# Patient Record
Sex: Female | Born: 1950
Health system: Southern US, Community
[De-identification: ages and names within clinical notes are randomized; demographics above are authoritative.]

## PROBLEM LIST (undated history)

## (undated) DIAGNOSIS — R112 Nausea with vomiting, unspecified: Secondary | ICD-10-CM

## (undated) DIAGNOSIS — K219 Gastro-esophageal reflux disease without esophagitis: Secondary | ICD-10-CM

## (undated) DIAGNOSIS — Z9889 Other specified postprocedural states: Secondary | ICD-10-CM

## (undated) DIAGNOSIS — H269 Unspecified cataract: Secondary | ICD-10-CM

## (undated) DIAGNOSIS — Z8719 Personal history of other diseases of the digestive system: Secondary | ICD-10-CM

## (undated) DIAGNOSIS — K760 Fatty (change of) liver, not elsewhere classified: Secondary | ICD-10-CM

## (undated) DIAGNOSIS — R569 Unspecified convulsions: Secondary | ICD-10-CM

## (undated) DIAGNOSIS — C801 Malignant (primary) neoplasm, unspecified: Secondary | ICD-10-CM

## (undated) DIAGNOSIS — M1990 Unspecified osteoarthritis, unspecified site: Secondary | ICD-10-CM

## (undated) HISTORY — PX: SMALL INTESTINE SURGERY: SHX150

## (undated) HISTORY — PX: COLOSTOMY: SHX63

## (undated) HISTORY — PX: OTHER SURGICAL HISTORY: SHX169

## (undated) HISTORY — DX: Unspecified cataract: H26.9

## (undated) HISTORY — PX: BREAST SURGERY: SHX581

## (undated) HISTORY — DX: Unspecified osteoarthritis, unspecified site: M19.90

---
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
## 2013-03-30 DIAGNOSIS — K5790 Diverticulosis of intestine, part unspecified, without perforation or abscess without bleeding: Secondary | ICD-10-CM | POA: Insufficient documentation

## 2013-03-30 DIAGNOSIS — K648 Other hemorrhoids: Secondary | ICD-10-CM | POA: Insufficient documentation

## 2013-04-23 DIAGNOSIS — M7580 Other shoulder lesions, unspecified shoulder: Secondary | ICD-10-CM | POA: Insufficient documentation

## 2013-05-15 DIAGNOSIS — G40A09 Absence epileptic syndrome, not intractable, without status epilepticus: Secondary | ICD-10-CM | POA: Insufficient documentation

## 2013-05-15 DIAGNOSIS — Z8669 Personal history of other diseases of the nervous system and sense organs: Secondary | ICD-10-CM | POA: Insufficient documentation

## 2013-05-15 DIAGNOSIS — G40309 Generalized idiopathic epilepsy and epileptic syndromes, not intractable, without status epilepticus: Secondary | ICD-10-CM | POA: Insufficient documentation

## 2013-10-08 DIAGNOSIS — C50111 Malignant neoplasm of central portion of right female breast: Secondary | ICD-10-CM | POA: Insufficient documentation

## 2014-01-24 DIAGNOSIS — L989 Disorder of the skin and subcutaneous tissue, unspecified: Secondary | ICD-10-CM | POA: Insufficient documentation

## 2014-01-24 DIAGNOSIS — Z5181 Encounter for therapeutic drug level monitoring: Secondary | ICD-10-CM | POA: Insufficient documentation

## 2015-03-28 DIAGNOSIS — M810 Age-related osteoporosis without current pathological fracture: Secondary | ICD-10-CM | POA: Insufficient documentation

## 2016-04-04 DIAGNOSIS — H93293 Other abnormal auditory perceptions, bilateral: Secondary | ICD-10-CM | POA: Insufficient documentation

## 2017-12-12 DIAGNOSIS — Z8719 Personal history of other diseases of the digestive system: Secondary | ICD-10-CM

## 2017-12-12 DIAGNOSIS — K5732 Diverticulitis of large intestine without perforation or abscess without bleeding: Secondary | ICD-10-CM | POA: Insufficient documentation

## 2017-12-12 HISTORY — DX: Personal history of other diseases of the digestive system: Z87.19

## 2019-01-04 DIAGNOSIS — Z933 Colostomy status: Secondary | ICD-10-CM | POA: Insufficient documentation

## 2019-01-18 DIAGNOSIS — IMO0002 Reserved for concepts with insufficient information to code with codable children: Secondary | ICD-10-CM | POA: Insufficient documentation

## 2019-01-18 DIAGNOSIS — M810 Age-related osteoporosis without current pathological fracture: Secondary | ICD-10-CM | POA: Diagnosis not present

## 2019-01-18 DIAGNOSIS — Z933 Colostomy status: Secondary | ICD-10-CM | POA: Diagnosis not present

## 2019-01-18 DIAGNOSIS — K5732 Diverticulitis of large intestine without perforation or abscess without bleeding: Secondary | ICD-10-CM | POA: Diagnosis not present

## 2019-01-18 DIAGNOSIS — Z8669 Personal history of other diseases of the nervous system and sense organs: Secondary | ICD-10-CM | POA: Diagnosis not present

## 2019-02-12 DIAGNOSIS — Z91018 Allergy to other foods: Secondary | ICD-10-CM | POA: Diagnosis not present

## 2019-02-12 DIAGNOSIS — Z933 Colostomy status: Secondary | ICD-10-CM | POA: Diagnosis not present

## 2019-02-12 DIAGNOSIS — Z886 Allergy status to analgesic agent status: Secondary | ICD-10-CM | POA: Diagnosis not present

## 2019-02-12 DIAGNOSIS — N390 Urinary tract infection, site not specified: Secondary | ICD-10-CM | POA: Diagnosis not present

## 2019-02-12 DIAGNOSIS — Z9049 Acquired absence of other specified parts of digestive tract: Secondary | ICD-10-CM | POA: Diagnosis not present

## 2019-02-12 DIAGNOSIS — Z433 Encounter for attention to colostomy: Secondary | ICD-10-CM | POA: Diagnosis not present

## 2019-02-12 DIAGNOSIS — Z885 Allergy status to narcotic agent status: Secondary | ICD-10-CM | POA: Diagnosis not present

## 2019-02-12 DIAGNOSIS — Z1159 Encounter for screening for other viral diseases: Secondary | ICD-10-CM | POA: Diagnosis not present

## 2019-02-12 DIAGNOSIS — K435 Parastomal hernia without obstruction or  gangrene: Secondary | ICD-10-CM | POA: Diagnosis not present

## 2019-02-12 DIAGNOSIS — R569 Unspecified convulsions: Secondary | ICD-10-CM | POA: Diagnosis not present

## 2019-02-12 DIAGNOSIS — E785 Hyperlipidemia, unspecified: Secondary | ICD-10-CM | POA: Diagnosis not present

## 2019-02-12 DIAGNOSIS — Z853 Personal history of malignant neoplasm of breast: Secondary | ICD-10-CM | POA: Diagnosis not present

## 2019-02-12 DIAGNOSIS — Z7982 Long term (current) use of aspirin: Secondary | ICD-10-CM | POA: Diagnosis not present

## 2019-02-12 DIAGNOSIS — K432 Incisional hernia without obstruction or gangrene: Secondary | ICD-10-CM | POA: Diagnosis not present

## 2019-02-12 DIAGNOSIS — Z79899 Other long term (current) drug therapy: Secondary | ICD-10-CM | POA: Diagnosis not present

## 2019-02-12 DIAGNOSIS — G8918 Other acute postprocedural pain: Secondary | ICD-10-CM | POA: Diagnosis not present

## 2019-02-12 DIAGNOSIS — Z888 Allergy status to other drugs, medicaments and biological substances status: Secondary | ICD-10-CM | POA: Diagnosis not present

## 2019-02-14 HISTORY — PX: COLOSTOMY REVERSAL: SHX5782

## 2019-02-15 DIAGNOSIS — K432 Incisional hernia without obstruction or gangrene: Secondary | ICD-10-CM | POA: Diagnosis not present

## 2019-02-15 DIAGNOSIS — Z433 Encounter for attention to colostomy: Secondary | ICD-10-CM | POA: Diagnosis not present

## 2019-02-15 DIAGNOSIS — Z933 Colostomy status: Secondary | ICD-10-CM | POA: Diagnosis not present

## 2019-02-15 DIAGNOSIS — Z9049 Acquired absence of other specified parts of digestive tract: Secondary | ICD-10-CM | POA: Insufficient documentation

## 2019-02-15 DIAGNOSIS — G8918 Other acute postprocedural pain: Secondary | ICD-10-CM | POA: Diagnosis not present

## 2019-02-15 DIAGNOSIS — K435 Parastomal hernia without obstruction or  gangrene: Secondary | ICD-10-CM | POA: Diagnosis not present

## 2019-03-05 ENCOUNTER — Other Ambulatory Visit: Payer: Self-pay

## 2019-03-05 ENCOUNTER — Ambulatory Visit (INDEPENDENT_AMBULATORY_CARE_PROVIDER_SITE_OTHER): Payer: Medicare Other | Admitting: Family Medicine

## 2019-03-05 ENCOUNTER — Encounter: Payer: Self-pay | Admitting: Family Medicine

## 2019-03-05 VITALS — BP 114/73 | HR 77 | Ht 60.0 in | Wt 149.0 lb

## 2019-03-05 DIAGNOSIS — F411 Generalized anxiety disorder: Secondary | ICD-10-CM

## 2019-03-05 DIAGNOSIS — Z853 Personal history of malignant neoplasm of breast: Secondary | ICD-10-CM

## 2019-03-05 DIAGNOSIS — G40A09 Absence epileptic syndrome, not intractable, without status epilepticus: Secondary | ICD-10-CM | POA: Diagnosis not present

## 2019-03-05 DIAGNOSIS — Z9049 Acquired absence of other specified parts of digestive tract: Secondary | ICD-10-CM | POA: Diagnosis not present

## 2019-03-05 DIAGNOSIS — Z8719 Personal history of other diseases of the digestive system: Secondary | ICD-10-CM

## 2019-03-05 MED ORDER — SERTRALINE HCL 25 MG PO TABS: 25.0000 mg | ORAL_TABLET | Freq: Every day | ORAL | 0 refills | Status: DC

## 2019-03-05 NOTE — Assessment & Plan Note (Signed)
History of anxiety-discussed restarting a low dose of Zoloft.  I am happy to do so.  New prescription sent to the pharmacy.  I like to follow-up with her in about 2 to 3 months before refill she would like it sent to mail order just to make sure that she is doing well and she is getting the response that she hopes and to make sure that the dose does not need to be adjusted before we send over another 90-day supply.

## 2019-03-05 NOTE — Assessment & Plan Note (Signed)
Followed at [facility] neurology.  Currently on Keppra for control.

## 2019-03-05 NOTE — Progress Notes (Signed)
Established Patient Office Visit  Subjective:  Patient ID: Valerie Fisher, female    DOB: 05-Jun-1951  Age: 68 y.o. MRN: [id number]  CC:  Chief Complaint  Patient presents with  . Establish Care    HPI Valerie Fisher presents to establish care.  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  She ended up having a colostomy because she had an episode of diverticulitis with abscess that ended up with a perforation.  She actually just had a colostomy reversal February 15, 2019.  She also reports a history of generalized seizures.  She says they started in 6 grade and then seemed to resolve but then had a grand mal seizure in 2010.  She follows with Dr. Yvonne Kendall at [facility] and is currently on Keppra twice a day.  She was also previously on anxiety medication, Zoloft.  She did well with it and took it for about 2 years.  This was around the time that I believe her mother passed away.  She says more recently she is been under increased stress after having the reversal of her colectomy.  She is been trying really hard not to take pain medications and feels like she would like to get back on her Zoloft.  She is been primarily relying on Tylenol for pain control she is been taking at thousand milligrams in the morning and at thousand milligrams in the evening.  Her colonoscopy and mammograms are up-to-date.  In regards to Pap smears she has aged out but says had a history of all normal Pap smears in the past.  She did receive her pneumonia vaccinations after age 1 as well as her shingles vaccine she had the new Shingrix and she did complete both injections.  Past Medical History:  Diagnosis Date  . History of diverticular abscess of colon 12/12/2017    Past Surgical History:  Procedure Laterality Date  . COLOSTOMY    . COLOSTOMY REVERSAL  02/2019    Family History  Problem Relation Age of Onset  . Diabetes Father   . Heart attack Father    mid 23s    Social History   Socioeconomic History  . Marital status: Married    Spouse name: BJ  . Number of children: 3  . Years of education: Not on file  . Highest education level: Not on file  Occupational History  . Occupation: retired Tour manager  . Financial resource strain: Not on file  . Food insecurity    Worry: Not on file    Inability: Not on file  . Transportation needs    Medical: Not on file    Non-medical: Not on file  Tobacco Use  . Smoking status: Never Smoker  . Smokeless tobacco: Never Used  Substance and Sexual Activity  . Alcohol use: Yes    Alcohol/week: 0.0 standard drinks    Comment: .5 glass once a month  . Drug use: Never  . Sexual activity: Yes    Partners: Male    Birth control/protection: None  Lifestyle  . Physical activity    Days per week: Not on file    Minutes per session: Not on file  . Stress: Not on file  Relationships  . Social Herbalist on phone: Not on file    Gets together: Not on file    Attends religious service: Not on file    Active member of club or organization: Not  on file    Attends meetings of clubs or organizations: Not on file    Relationship status: Not on file  . Intimate partner violence    Fear of current or ex partner: Not on file    Emotionally abused: Not on file    Physically abused: Not on file    Forced sexual activity: Not on file  Other Topics Concern  . Not on file  Social History Narrative  . Not on file    Outpatient Medications Prior to Visit  Medication Sig Dispense Refill  . levETIRAcetam (KEPPRA) 500 MG tablet Take 500 mg by mouth 3 (three) times daily.     . Loratadine 10 MG CAPS Take 1 capsule by mouth as needed.    Marland Kitchen acetaminophen (TYLENOL) 325 MG tablet Take 650 mg by mouth every 6 (six) hours as needed for pain.    Marland Kitchen aspirin EC 81 MG tablet Take 1 tablet by mouth daily.    Marland Kitchen levETIRAcetam (KEPPRA) 500 MG tablet Take 3 tablets by mouth. Take 3 tablets in the  morning and 3 tablets in the evening.     No facility-administered medications prior to visit.     Allergies  Allergen Reactions  . Aspirin Other (See Comments)    Other reaction(s): GI Upset (intolerance), Other GI upset GI upset   . Hydrocodone-Acetaminophen Nausea And Vomiting  . Hydromorphone Hcl Nausea And Vomiting  . Morphine Nausea And Vomiting  . Pseudoephedrine Hcl Nausea And Vomiting    ROS Review of Systems    Objective:    Physical Exam  Constitutional: She is oriented to person, place, and time. She appears well-developed and well-nourished.  HENT:  Head: Normocephalic and atraumatic.  Cardiovascular: Normal rate, regular rhythm and normal heart sounds.  Pulmonary/Chest: Effort normal and breath sounds normal.  Neurological: She is alert and oriented to person, place, and time.  Skin: Skin is warm and dry.  Psychiatric: She has a normal mood and affect. Her behavior is normal.    BP 114/73   Pulse 77   Ht 5' (1.524 m)   Wt 149 lb (67.6 kg)   SpO2 99%   BMI 29.10 kg/m  Wt Readings from Last 3 Encounters:  03/05/19 149 lb (67.6 kg)     Health Maintenance Due  Topic Date Due  . Hepatitis C Screening  1951/07/31  . MAMMOGRAM  11/04/1998  . COLONOSCOPY  11/04/1998  . DEXA SCAN  11/03/2013    There are no preventive care reminders to display for this patient.  No results found for: TSH No results found for: WBC, HGB, HCT, MCV, PLT No results found for: NA, K, CHLORIDE, CO2, GLUCOSE, BUN, CREATININE, BILITOT, ALKPHOS, AST, ALT, PROT, ALBUMIN, CALCIUM, ANIONGAP, EGFR, GFR No results found for: CHOL No results found for: HDL No results found for: LDLCALC No results found for: TRIG No results found for: CHOLHDL No results found for: HGBA1C    Assessment & Plan:   Problem List Items Addressed This Visit      Digestive   History of diverticular abscess of colon     Nervous and Auditory   Absence epilepsy, not intractable, without status  epilepticus ([place]) - Primary    Followed at [facility] neurology.  Currently on Keppra for control.      Relevant Medications   levETIRAcetam (KEPPRA) 500 MG tablet     Other   History of right breast cancer    Was followed at [facility].  Completed all  treatments.      H/O colectomy    Status post reversal-overall doing well but pain is not well controlled.  We discussed alternating an anti-inflammatory with the Tylenol and maybe even considering tramadol if needed.  Just that she will at least try doing an NSAID.  Stop immediately if any GI upset or irritation.      GAD (generalized anxiety disorder)    History of anxiety-discussed restarting a low dose of Zoloft.  I am happy to do so.  New prescription sent to the pharmacy.  I like to follow-up with her in about 2 to 3 months before refill she would like it sent to mail order just to make sure that she is doing well and she is getting the response that she hopes and to make sure that the dose does not need to be adjusted before we send over another 90-day supply.      Relevant Medications   sertraline (ZOLOFT) 25 MG tablet      Meds ordered this encounter  Medications  . sertraline (ZOLOFT) 25 MG tablet    Sig: Take 1 tablet (25 mg total) by mouth daily.    Dispense:  90 tablet    Refill:  0    Follow-up: Return in about 2 months (around 05/06/2019) for virtual visit for mood, restart zoloft .    Beatrice Lecher, MD

## 2019-03-05 NOTE — Assessment & Plan Note (Signed)
Was followed at [facility].  Completed all treatments.

## 2019-03-05 NOTE — Assessment & Plan Note (Signed)
Status post reversal-overall doing well but pain is not well controlled.  We discussed alternating an anti-inflammatory with the Tylenol and maybe even considering tramadol if needed.  Just that she will at least try doing an NSAID.  Stop immediately if any GI upset or irritation.

## 2019-04-06 DIAGNOSIS — Z9049 Acquired absence of other specified parts of digestive tract: Secondary | ICD-10-CM | POA: Diagnosis not present

## 2019-04-06 DIAGNOSIS — K582 Mixed irritable bowel syndrome: Secondary | ICD-10-CM | POA: Diagnosis not present

## 2019-04-06 DIAGNOSIS — Z9889 Other specified postprocedural states: Secondary | ICD-10-CM | POA: Diagnosis not present

## 2019-04-25 ENCOUNTER — Other Ambulatory Visit: Payer: Self-pay | Admitting: *Deleted

## 2019-04-25 DIAGNOSIS — F411 Generalized anxiety disorder: Secondary | ICD-10-CM

## 2019-04-25 MED ORDER — SERTRALINE HCL 25 MG PO TABS: 25.0000 mg | ORAL_TABLET | Freq: Every day | ORAL | 1 refills | Status: DC

## 2019-05-02 ENCOUNTER — Ambulatory Visit (INDEPENDENT_AMBULATORY_CARE_PROVIDER_SITE_OTHER): Payer: Medicare Other | Admitting: Family Medicine

## 2019-05-02 ENCOUNTER — Other Ambulatory Visit: Payer: Self-pay

## 2019-05-02 DIAGNOSIS — Z23 Encounter for immunization: Secondary | ICD-10-CM

## 2019-05-07 ENCOUNTER — Encounter: Payer: Self-pay | Admitting: Family Medicine

## 2019-05-07 ENCOUNTER — Ambulatory Visit (INDEPENDENT_AMBULATORY_CARE_PROVIDER_SITE_OTHER): Payer: Medicare Other | Admitting: Family Medicine

## 2019-05-07 VITALS — Temp 97.1°F | Ht 60.0 in | Wt 137.0 lb

## 2019-05-07 DIAGNOSIS — F411 Generalized anxiety disorder: Secondary | ICD-10-CM | POA: Diagnosis not present

## 2019-05-07 DIAGNOSIS — G4700 Insomnia, unspecified: Secondary | ICD-10-CM

## 2019-05-07 DIAGNOSIS — Z1231 Encounter for screening mammogram for malignant neoplasm of breast: Secondary | ICD-10-CM | POA: Diagnosis not present

## 2019-05-07 DIAGNOSIS — M79604 Pain in right leg: Secondary | ICD-10-CM | POA: Insufficient documentation

## 2019-05-07 DIAGNOSIS — Z780 Asymptomatic menopausal state: Secondary | ICD-10-CM

## 2019-05-07 NOTE — Progress Notes (Signed)
Virtual Visit via Video Note  I connected with Valerie Fisher on 05/07/19 at 10:30 AM EDT by a video enabled telemedicine application and verified that I am speaking with the correct person using two identifiers.   I discussed the limitations of evaluation and management by telemedicine and the availability of in person appointments. The patient expressed understanding and agreed to proceed.    Established Patient Office Visit  Subjective:  Patient ID: Valerie Fisher, female    DOB: 20-Nov-1950  Age: 68 y.o. MRN: [id number]  CC:  Chief Complaint  Patient presents with  . mood    HPI Valerie Fisher presents for F/U GAD. 2 mo f/u.  We recently decided to restart Zoloft because of increased anxiety.  She is actually taken it in the past.  She is currently on 25 mg daily.  So far she has been tolerating it well and feels like it is been helpful.  She denies any significant side effects.  Would like to go ahead and get her mammogram and bone density scheduled at [facility]..  She still really struggling with sleep.  Is mostly having hard time falling asleep.  Feels like her mind races.  But she is also waking up about 3-4 times a night to urinate but usually is able to fall back asleep pretty easily.  She has tried some melatonin and so that is not really helping.  She also complains of her legs hurting mostly her right leg.  She has been trying to walk for 30 to 40 minutes each afternoon for exercise because it seems to help her abdominal pain.  But now her right leg has been hurting.  She says the other night she actually fell asleep on her back and feels like the leg felt better.  She feels like it is the whole leg she is not able to discriminate just a specific location.  She also is let me know she saw GI and was diagnosed with irritable bowel syndrome.  She was started on the low third map diet and has actually lost weight on that and feels like she is doing better.    Past Medical  History:  Diagnosis Date  . History of diverticular abscess of colon 12/12/2017    Past Surgical History:  Procedure Laterality Date  . COLOSTOMY    . COLOSTOMY REVERSAL  02/2019    Family History  Problem Relation Age of Onset  . Diabetes Father   . Heart attack Father        mid 37s    Social History   Socioeconomic History  . Marital status: Married    Spouse name: BJ  . Number of children: 3  . Years of education: Not on file  . Highest education level: Not on file  Occupational History  . Occupation: retired Tour manager  . Financial resource strain: Not on file  . Food insecurity    Worry: Not on file    Inability: Not on file  . Transportation needs    Medical: Not on file    Non-medical: Not on file  Tobacco Use  . Smoking status: Never Smoker  . Smokeless tobacco: Never Used  Substance and Sexual Activity  . Alcohol use: Yes    Alcohol/week: 0.0 standard drinks    Comment: .5 glass once a month  . Drug use: Never  . Sexual activity: Yes    Partners: Male    Birth control/protection: None  Lifestyle  . Physical activity  Days per week: Not on file    Minutes per session: Not on file  . Stress: Not on file  Relationships  . Social Herbalist on phone: Not on file    Gets together: Not on file    Attends religious service: Not on file    Active member of club or organization: Not on file    Attends meetings of clubs or organizations: Not on file    Relationship status: Not on file  . Intimate partner violence    Fear of current or ex partner: Not on file    Emotionally abused: Not on file    Physically abused: Not on file    Forced sexual activity: Not on file  Other Topics Concern  . Not on file  Social History Narrative  . Not on file    Outpatient Medications Prior to Visit  Medication Sig Dispense Refill  . levETIRAcetam (KEPPRA) 500 MG tablet Take 500 mg by mouth 3 (three) times daily.     . Loratadine 10 MG CAPS  Take 1 capsule by mouth as needed.    . sertraline (ZOLOFT) 25 MG tablet Take 1 tablet (25 mg total) by mouth daily. 90 tablet 1   No facility-administered medications prior to visit.     Allergies  Allergen Reactions  . Aspirin Other (See Comments)    Other reaction(s): GI Upset (intolerance), Other GI upset GI upset   . Hydrocodone-Acetaminophen Nausea And Vomiting  . Hydromorphone Hcl Nausea And Vomiting  . Morphine Nausea And Vomiting  . Pseudoephedrine Hcl Nausea And Vomiting    ROS Review of Systems    Objective:    Physical Exam  Constitutional: She is oriented to person, place, and time. She appears well-developed and well-nourished.  HENT:  Head: Normocephalic and atraumatic.  Eyes: Conjunctivae and EOM are normal.  Pulmonary/Chest: Effort normal.  Neurological: She is alert and oriented to person, place, and time.  Skin: Skin is dry. No pallor.  Psychiatric: She has a normal mood and affect. Her behavior is normal.  Vitals reviewed.   Temp (!) 97.1 F (36.2 C)   Ht 5' (1.524 m)   Wt 137 lb (62.1 kg)   BMI 26.76 kg/m  Wt Readings from Last 3 Encounters:  05/07/19 137 lb (62.1 kg)  03/05/19 149 lb (67.6 kg)     Health Maintenance Due  Topic Date Due  . Hepatitis C Screening  Nov 16, 1950  . MAMMOGRAM  11/04/1998  . COLONOSCOPY  11/04/1998  . DEXA SCAN  11/03/2013    There are no preventive care reminders to display for this patient.  No results found for: TSH No results found for: WBC, HGB, HCT, MCV, PLT No results found for: NA, K, CHLORIDE, CO2, GLUCOSE, BUN, CREATININE, BILITOT, ALKPHOS, AST, ALT, PROT, ALBUMIN, CALCIUM, ANIONGAP, EGFR, GFR No results found for: CHOL No results found for: HDL No results found for: LDLCALC No results found for: TRIG No results found for: CHOLHDL No results found for: HGBA1C    Assessment & Plan:   Problem List Items Addressed This Visit      Other   Right leg pain    Clear etiology.  It does seem to  have ramped up since she has been walking more I also do not want her to quit walking because is been really beneficial for her.  We discussed options including getting with 1 of our sports med docs.  She is not really able to localize her  pain.  And its not keeping her from walking which is good.      Insomnia    Multifactorial.  It sounds as she 7 a hard time falling asleep because her mind is racing which certainly could be anxiety triggered.  She is also waking up frequently at night with overactive bladder and then she is also having right leg pain which makes it more difficult for her to fall asleep.  We did discuss some nonpharmacologic treatments.  We also discussed putting a pillow between her knees to help with the leg pain and possible referral to sports medicine to work-up the leg pain a little bit more.  We will increase her Zoloft for 30 days and see if this helps.  If not we can prescribe consider other prescription options.      GAD (generalized anxiety disorder) - Primary    Anxiety-we discussed options.  She is actually doing well so far on the sertraline 25 mg but still really struggling with falling asleep.  Mostly her mind races when she first tries to go to sleep so we did discuss possibly increasing her Effexor release for 30 days to see if this makes a difference.  If not helpful then we can go from there.         Other Visit Diagnoses    Postmenopausal       Relevant Orders   DG Bone Density   Screening mammogram, encounter for       Relevant Orders   MM 3D SCREEN BREAST BILATERAL     Mammogram and DEXA ordered for [facility].  No orders of the defined types were placed in this encounter.   Follow-up: Return in about 4 weeks (around 06/04/2019) for medication changes and sleep problems. .    I discussed the assessment and treatment plan with the patient. The patient was provided an opportunity to ask questions and all were answered. The patient agreed with the plan  and demonstrated an understanding of the instructions.   The patient was advised to call back or seek an in-person evaluation if the symptoms worsen or if the condition fails to improve as anticipated.    Beatrice Lecher, MD

## 2019-05-07 NOTE — Assessment & Plan Note (Signed)
Clear etiology.  It does seem to have ramped up since she has been walking more I also do not want her to quit walking because is been really beneficial for her.  We discussed options including getting with 1 of our sports med docs.  She is not really able to localize her pain.  And its not keeping her from walking which is good.

## 2019-05-07 NOTE — Assessment & Plan Note (Signed)
Multifactorial.  It sounds as she 7 a hard time falling asleep because her mind is racing which certainly could be anxiety triggered.  She is also waking up frequently at night with overactive bladder and then she is also having right leg pain which makes it more difficult for her to fall asleep.  We did discuss some nonpharmacologic treatments.  We also discussed putting a pillow between her knees to help with the leg pain and possible referral to sports medicine to work-up the leg pain a little bit more.  We will increase her Zoloft for 30 days and see if this helps.  If not we can prescribe consider other prescription options.

## 2019-05-07 NOTE — Assessment & Plan Note (Signed)
Anxiety-we discussed options.  She is actually doing well so far on the sertraline 25 mg but still really struggling with falling asleep.  Mostly her mind races when she first tries to go to sleep so we did discuss possibly increasing her Effexor release for 30 days to see if this makes a difference.  If not helpful then we can go from there.

## 2019-05-07 NOTE — Progress Notes (Signed)
Pt reports that she is doing well on current regimen.Rashidah Belleville Lynetta, CMA  

## 2019-05-25 ENCOUNTER — Telehealth: Payer: Self-pay

## 2019-05-25 DIAGNOSIS — F411 Generalized anxiety disorder: Secondary | ICD-10-CM

## 2019-05-25 NOTE — Telephone Encounter (Signed)
Valerie Fisher called wanting a refill of Sertraline. She states the Sertraline was increased to 50 mg daily.

## 2019-05-27 MED ORDER — SERTRALINE HCL 50 MG PO TABS: 50.0000 mg | ORAL_TABLET | Freq: Every day | ORAL | 1 refills | Status: DC

## 2019-05-27 NOTE — Telephone Encounter (Signed)
rx sent, pls notify pt

## 2019-05-28 NOTE — Telephone Encounter (Signed)
Pt advised.

## 2019-06-06 ENCOUNTER — Ambulatory Visit: Payer: Medicare Other | Admitting: Family Medicine

## 2019-06-11 ENCOUNTER — Encounter: Payer: Self-pay | Admitting: Family Medicine

## 2019-06-11 ENCOUNTER — Ambulatory Visit (INDEPENDENT_AMBULATORY_CARE_PROVIDER_SITE_OTHER): Payer: Medicare Other | Admitting: Family Medicine

## 2019-06-11 VITALS — Ht 60.0 in | Wt 137.0 lb

## 2019-06-11 DIAGNOSIS — F411 Generalized anxiety disorder: Secondary | ICD-10-CM | POA: Diagnosis not present

## 2019-06-11 DIAGNOSIS — H6983 Other specified disorders of Eustachian tube, bilateral: Secondary | ICD-10-CM | POA: Diagnosis not present

## 2019-06-11 NOTE — Assessment & Plan Note (Signed)
She is doing well on her current regimen is happy with the results thus far.  We discussed options including staying on what she is doing or possibly going up.  She says for now she will just take with the 50 mg and will plan to follow-up in 3 months if at any point time she is having any problems please let us know.

## 2019-06-11 NOTE — Progress Notes (Signed)
Pt is doing well on current regimen.Ronie Barnhart Lynetta, CMA  

## 2019-06-11 NOTE — Progress Notes (Addendum)
Virtual Visit via Video Note  I connected with [name] on 06/11/19 at 10:50 AM EDT by a video enabled telemedicine application and verified that I am speaking with the correct person using two identifiers.   I discussed the limitations of evaluation and management by telemedicine and the availability of in person appointments. The patient expressed understanding and agreed to proceed.    Established Patient Office Visit  Subjective:  Patient ID: Valerie Fisher, female    DOB: 02/25/1951  Age: 68 y.o. MRN: [id number]  CC:  Chief Complaint  Patient presents with  . mood    HPI Valerie Fisher presents for follow-up generalized anxiety disorder-she was having some difficulty with sleep and mind racing when I last spoke with her 4 weeks ago.  We decided to increase her sertraline to 50 mg and this is a 1 month follow-up.  She feels like she has noticed some improvement and she has not had any side effects.  She said she still struggling some with her symptoms but overall is doing better.  She says her sleep has actually gotten much better.  Before she was just having a lot of mind racing before bedtime and really just could not get to sleep well.  She was under a lot of stress recently and had a reversal of her colostomy she had a perforated abscess.  She is actually doing really well and in fact she and her husband just got back from [facility].  She says they stayed safe and socially distance but it was good just to get away.  He also let me noted that she was starting to have pain and pressure in her ears again worse on the right ear compared to the left.  She has had this previously we have checked her ears and they have always looked good.  She wonders if it could have been from traveling to [facility].  She did start her nasal spray and loratadine when she got home.  He also wanted to know if she could consider getting Covid tested before Thanksgiving.  She wants to be around family  but wants to make sure that she and her husband do not have it.  Past Medical History:  Diagnosis Date  . History of diverticular abscess of colon 12/12/2017    Past Surgical History:  Procedure Laterality Date  . COLOSTOMY    . COLOSTOMY REVERSAL  02/2019    Family History  Problem Relation Age of Onset  . Diabetes Father   . Heart attack Father        mid 73s    Social History   Socioeconomic History  . Marital status: Married    Spouse name: BJ  . Number of children: 3  . Years of education: Not on file  . Highest education level: Not on file  Occupational History  . Occupation: retired Tour manager  . Financial resource strain: Not on file  . Food insecurity    Worry: Not on file    Inability: Not on file  . Transportation needs    Medical: Not on file    Non-medical: Not on file  Tobacco Use  . Smoking status: Never Smoker  . Smokeless tobacco: Never Used  Substance and Sexual Activity  . Alcohol use: Yes    Alcohol/week: 0.0 standard drinks    Comment: .5 glass once a month  . Drug use: Never  . Sexual activity: Yes    Partners: Male  Birth control/protection: None  Lifestyle  . Physical activity    Days per week: Not on file    Minutes per session: Not on file  . Stress: Not on file  Relationships  . Social Herbalist on phone: Not on file    Gets together: Not on file    Attends religious service: Not on file    Active member of club or organization: Not on file    Attends meetings of clubs or organizations: Not on file    Relationship status: Not on file  . Intimate partner violence    Fear of current or ex partner: Not on file    Emotionally abused: Not on file    Physically abused: Not on file    Forced sexual activity: Not on file  Other Topics Concern  . Not on file  Social History Narrative  . Not on file    Outpatient Medications Prior to Visit  Medication Sig Dispense Refill  . levETIRAcetam (KEPPRA) 500 MG  tablet Take 500 mg by mouth 3 (three) times daily.     . Loratadine 10 MG CAPS Take 1 capsule by mouth as needed.    . sertraline (ZOLOFT) 50 MG tablet Take 1 tablet (50 mg total) by mouth daily. 90 tablet 1   No facility-administered medications prior to visit.     Allergies  Allergen Reactions  . Aspirin Other (See Comments)    Other reaction(s): GI Upset (intolerance), Other GI upset GI upset   . Hydrocodone-Acetaminophen Nausea And Vomiting  . Hydromorphone Hcl Nausea And Vomiting  . Morphine Nausea And Vomiting  . Pseudoephedrine Hcl Nausea And Vomiting    ROS Review of Systems    Objective:    Physical Exam  Ht 5' (1.524 m)   Wt 137 lb (62.1 kg)   BMI 26.76 kg/m  Wt Readings from Last 3 Encounters:  06/11/19 137 lb (62.1 kg)  05/07/19 137 lb (62.1 kg)  03/05/19 149 lb (67.6 kg)     Health Maintenance Due  Topic Date Due  . Hepatitis C Screening  Mar 08, 1951  . MAMMOGRAM  11/04/1998  . COLONOSCOPY  11/04/1998  . DEXA SCAN  11/03/2013    There are no preventive care reminders to display for this patient.  No results found for: TSH No results found for: WBC, HGB, HCT, MCV, PLT No results found for: NA, K, CHLORIDE, CO2, GLUCOSE, BUN, CREATININE, BILITOT, ALKPHOS, AST, ALT, PROT, ALBUMIN, CALCIUM, ANIONGAP, EGFR, GFR No results found for: CHOL No results found for: HDL No results found for: LDLCALC No results found for: TRIG No results found for: CHOLHDL No results found for: HGBA1C    Assessment & Plan:   Problem List Items Addressed This Visit      Other   GAD (generalized anxiety disorder) - Primary    She is doing well on her current regimen is happy with the results thus far.  We discussed options including staying on what she is doing or possibly going up.  She says for now she will just take with the 50 mg and will plan to follow-up in 3 months if at any point time she is having any problems please let us know.       Other Visit Diagnoses     Dysfunction of both eustachian tubes         Eustachian tube dysfunction-certainly if her pain gets worse or she starts noticing any drainage in the ear or has a  fever please come into that we can do an exam but in the past ear exam has been normal.  Just restart nasal spray, loratadine and can add nasal saline irrigation as well.  Hopefully she will improve over the next 1 to 2 weeks.  We are happy to do a screening since she is asymptomatic before travel.  She would need to let [place] know about 3 to 4 days before she leaves for the beach for [place].    No orders of the defined types were placed in this encounter.   Follow-up: Return in about 3 months (around 09/11/2019) for Mood medication. .    I discussed the assessment and treatment plan with the patient. The patient was provided an opportunity to ask questions and all were answered. The patient agreed with the plan and demonstrated an understanding of the instructions.   The patient was advised to call back or seek an in-person evaluation if the symptoms worsen or if the condition fails to improve as anticipated.  Beatrice Lecher, MD

## 2019-08-21 LAB — HM MAMMOGRAPHY

## 2019-08-24 ENCOUNTER — Encounter: Payer: Self-pay | Admitting: Family Medicine

## 2019-08-24 LAB — HM MAMMOGRAPHY

## 2019-08-24 LAB — HM DEXA SCAN

## 2019-08-27 ENCOUNTER — Telehealth: Payer: Self-pay | Admitting: Family Medicine

## 2019-08-27 DIAGNOSIS — M810 Age-related osteoporosis without current pathological fracture: Secondary | ICD-10-CM

## 2019-08-27 DIAGNOSIS — Z13220 Encounter for screening for lipoid disorders: Secondary | ICD-10-CM

## 2019-08-27 NOTE — Telephone Encounter (Signed)
Call pt: DEXA scan shows score of -2.5 which is consistent with Osteoporosis. Based on this I would recommend a bone builder like Fosamax.  If she is ok with taking the medicaton to keep her bones strong let me know. Also  Recommend daily calcium and vitamin D supplement.

## 2019-08-28 NOTE — Telephone Encounter (Signed)
Valerie Fisher would like Prolia injection.    Barnet Pall can you see if she needs a PA for the Prolia?

## 2019-08-28 NOTE — Telephone Encounter (Signed)
Okay, sounds good lets get her set up for Prolia as long as insurance will cover.

## 2019-09-03 NOTE — Telephone Encounter (Signed)
I called the patient and she is agreeable to the Prolia and I advised her that I would check with insurance and they would get back with her once I received a response from insurance. No other questions at this time.

## 2019-09-07 ENCOUNTER — Encounter: Payer: Self-pay | Admitting: Family Medicine

## 2019-09-07 NOTE — Telephone Encounter (Signed)
Patient called back and wants to if she needs labs. I am in the process of working on her Prolia.  Please advise.

## 2019-09-07 NOTE — Telephone Encounter (Signed)
She will need calcium and renal function before Prolia. Plus I would like to get a lipid and CBC too.   CMP, lipid, CBC

## 2019-09-07 NOTE — Telephone Encounter (Signed)
lvm advising pt that she will need to have labs done prior to getting the injection. Labs faxed.Maryruth Eve, Lahoma Crocker

## 2019-09-11 ENCOUNTER — Ambulatory Visit (INDEPENDENT_AMBULATORY_CARE_PROVIDER_SITE_OTHER): Payer: Medicare Other | Admitting: Family Medicine

## 2019-09-11 ENCOUNTER — Telehealth: Payer: Self-pay | Admitting: Family Medicine

## 2019-09-11 ENCOUNTER — Encounter: Payer: Self-pay | Admitting: Family Medicine

## 2019-09-11 VITALS — BP 108/73 | HR 67 | Ht 60.0 in | Wt 135.0 lb

## 2019-09-11 DIAGNOSIS — G4700 Insomnia, unspecified: Secondary | ICD-10-CM | POA: Diagnosis not present

## 2019-09-11 DIAGNOSIS — Z13220 Encounter for screening for lipoid disorders: Secondary | ICD-10-CM | POA: Diagnosis not present

## 2019-09-11 DIAGNOSIS — M79604 Pain in right leg: Secondary | ICD-10-CM

## 2019-09-11 DIAGNOSIS — M79605 Pain in left leg: Secondary | ICD-10-CM

## 2019-09-11 DIAGNOSIS — M810 Age-related osteoporosis without current pathological fracture: Secondary | ICD-10-CM

## 2019-09-11 DIAGNOSIS — Z853 Personal history of malignant neoplasm of breast: Secondary | ICD-10-CM

## 2019-09-11 DIAGNOSIS — D649 Anemia, unspecified: Secondary | ICD-10-CM

## 2019-09-11 DIAGNOSIS — F411 Generalized anxiety disorder: Secondary | ICD-10-CM

## 2019-09-11 NOTE — Progress Notes (Signed)
Pt reports that she has noticed that she is beginning to experience hair loss, dizziness and continued achy legs with Sertraline.   Also she would like to know if there are any side effects with taking prolia and COVID

## 2019-09-11 NOTE — Telephone Encounter (Signed)
Call pt: the labs didn't draw the correct tubes of blood so they didn't run it. So we are just going to order that from Friday and thhe new stuff for [facility]. She just needs to make sure the [facility] doesn't bill her.

## 2019-09-11 NOTE — Progress Notes (Signed)
Virtual Visit via Video Note  I connected with Valerie Fisher on 09/11/19 at 10:30 AM EST by a video enabled telemedicine application and verified that I am speaking with the correct person using two identifiers.   I discussed the limitations of evaluation and management by telemedicine and the availability of in person appointments. The patient expressed understanding and agreed to proceed.   Established Patient Office Visit  Subjective:  Patient ID: Valerie Fisher, female    DOB: May 29, 1951  Age: 69 y.o. MRN: [id number]  CC:  Chief Complaint  Patient presents with  . mood    HPI Valerie Fisher presents for 60-monthfollow-up for mood medication. She feels like it is causing some intermittent dizziness. Can happen at any time and doesn't seem to be related to position change.  He says it just usually last for a few seconds and then seems to resolve.  She says she feels like it is making her hair fall out.  She does feel like it helps with her anxiety though.  She had taken this medication previously and did not have any problems with that at that time.  She still continues to have bilateral leg pain mostly in the calf area.  She says she really only notices it at night when she is getting ready to go to bed.  It does not really bother her during the day.  She has been trying to walk for 45 to 60 minutes/day but says she notices that the pain is there whether she walks or does not walk.  Since being retired she has been home and has mostly been wearing bedroom shoes around the house.  She denies any significant cramping.  She says she was diagnosed with restless leg syndrome years ago but has not had problems in a very long time.  She reports she still is not sleeping well.  Still having a hard time falling asleep.  But then also waking up about 3-4 times at night per previous note.  She had tried melatonin and did not find it really helpful.  She is also had reduced anxiety over the last several  months on the sertraline but that has not really helped her sleep much either.   She also reports that she did go for labs on Friday but says that she has not heard back about those results.  Past Medical History:  Diagnosis Date  . History of diverticular abscess of colon 12/12/2017    Past Surgical History:  Procedure Laterality Date  . COLOSTOMY    . COLOSTOMY REVERSAL  02/2019    Family History  Problem Relation Age of Onset  . Diabetes Father   . Heart attack Father        mid 768s   Social History   Socioeconomic History  . Marital status: Married    Spouse name: BJ  . Number of children: 3  . Years of education: Not on file  . Highest education level: Not on file  Occupational History  . Occupation: retired tPharmacist, hospital Tobacco Use  . Smoking status: Never Smoker  . Smokeless tobacco: Never Used  Substance and Sexual Activity  . Alcohol use: Yes    Alcohol/week: 0.0 standard drinks    Comment: .5 glass once a month  . Drug use: Never  . Sexual activity: Yes    Partners: Male    Birth control/protection: None  Other Topics Concern  . Not on file  Social History Narrative  . Not on  file   Social Determinants of Health   Financial Resource Strain:   . Difficulty of Paying Living Expenses: Not on file  Food Insecurity:   . Worried About Charity fundraiser in the Last Year: Not on file  . Ran Out of Food in the Last Year: Not on file  Transportation Needs:   . Lack of Transportation (Medical): Not on file  . Lack of Transportation (Non-Medical): Not on file  Physical Activity:   . Days of Exercise per Week: Not on file  . Minutes of Exercise per Session: Not on file  Stress:   . Feeling of Stress : Not on file  Social Connections:   . Frequency of Communication with Friends and Family: Not on file  . Frequency of Social Gatherings with Friends and Family: Not on file  . Attends Religious Services: Not on file  . Active Member of Clubs or  Organizations: Not on file  . Attends Archivist Meetings: Not on file  . Marital Status: Not on file  Intimate Partner Violence:   . Fear of Current or Ex-Partner: Not on file  . Emotionally Abused: Not on file  . Physically Abused: Not on file  . Sexually Abused: Not on file    Outpatient Medications Prior to Visit  Medication Sig Dispense Refill  . levETIRAcetam (KEPPRA) 500 MG tablet Take 500 mg by mouth 3 (three) times daily.     . Loratadine 10 MG CAPS Take 1 capsule by mouth as needed.    . sertraline (ZOLOFT) 50 MG tablet Take 1 tablet (50 mg total) by mouth daily. 90 tablet 1   No facility-administered medications prior to visit.    Allergies  Allergen Reactions  . Aspirin Other (See Comments)    Other reaction(s): GI Upset (intolerance), Other GI upset GI upset   . Hydrocodone-Acetaminophen Nausea And Vomiting  . Hydromorphone Hcl Nausea And Vomiting  . Morphine Nausea And Vomiting  . Pseudoephedrine Hcl Nausea And Vomiting    ROS Review of Systems    Objective:    Physical Exam  Constitutional: She is oriented to person, place, and time. She appears well-developed and well-nourished.  HENT:  Head: Normocephalic and atraumatic.  Eyes: Conjunctivae and EOM are normal.  Cardiovascular: Normal rate and normal heart sounds.  Pulmonary/Chest: Effort normal.  Neurological: She is alert and oriented to person, place, and time.  Skin: Skin is dry. No pallor.  Psychiatric: She has a normal mood and affect. Her behavior is normal.  Vitals reviewed.   BP 108/73   Pulse 67   Ht 5' (1.524 m)   Wt 135 lb (61.2 kg)   SpO2 98%   BMI 26.37 kg/m  Wt Readings from Last 3 Encounters:  09/11/19 135 lb (61.2 kg)  06/11/19 137 lb (62.1 kg)  05/07/19 137 lb (62.1 kg)     Health Maintenance Due  Topic Date Due  . Hepatitis C Screening  [birth year]  . COLONOSCOPY  11/04/1998  . DEXA SCAN  11/03/2013    There are no preventive care reminders to display  for this patient.  No results found for: TSH Lab Results  Component Value Date   WBC CANCELED 09/07/2019   Lab Results  Component Value Date   GLUCOSE CANCELED 09/07/2019   No results found for: CHOL No results found for: HDL Lab Results  Component Value Date   LDLCALC CANCELED 09/07/2019   No results found for: TRIG No results found for: CHOLHDL No results  found for: HGBA1C    Assessment & Plan:   Problem List Items Addressed This Visit      Musculoskeletal and Integument   Osteoporosis   Relevant Orders   Fe+TIBC+Fer   TSH   Lipid panel   CMP14+EGFR   CBC     Other   Insomnia   History of right breast cancer   Relevant Orders   Fe+TIBC+Fer   TSH   Lipid panel   CMP14+EGFR   CBC   GAD (generalized anxiety disorder) - Primary    Other Visit Diagnoses    Bilateral leg pain       Relevant Orders   Fe+TIBC+Fer   TSH   Lipid panel   CMP14+EGFR   CBC   Screening, lipid       Relevant Orders   Lipid panel   Hypocalcemia       Relevant Orders   TSH   CMP14+EGFR   Anemia, unspecified type       Relevant Orders   Fe+TIBC+Fer   CBC     Bilateral leg pain-sounds very consistent with possible restless leg syndrome.  Will evaluate for thyroid and iron deficiency.  She would like to get her labs done at [facility] instead of [facility].  So she will come by and pick up a prescription.  We also discussed wearing more supportive shoe wear during the day instead of bedroom slippers and seeing if that makes a difference at night.  Also consider trial of treatment/medication for restless leg if blood work is normal.  Insomnia -discussed that we could always consider a trial of a sleep medication at some point such as trazodone.  Generalized anxiety disorder-discussed options including changing medications.  She says for now she just wants to taper off and see if her symptoms resolve and then consider starting something new.  She will decrease the sertraline 50 mg down to  half a tab daily for 1 week and a half a tab every other day for 1 week and then stop.  Anemia-last hemoglobin was low.  So we will check some additional labs including iron and ferritin levels.  Plus with recent leg pain IM wanting to rule out iron deficiency anemia.  No orders of the defined types were placed in this encounter.  It looks like her labs were collected but they were not run.  Patient is preferring to go to [place].  Follow-up: Return in about 3 months (around 12/10/2019).    I discussed the assessment and treatment plan with the patient. The patient was provided an opportunity to ask questions and all were answered. The patient agreed with the plan and demonstrated an understanding of the instructions.   The patient was advised to call back or seek an in-person evaluation if the symptoms worsen or if the condition fails to improve as anticipated.   Beatrice Lecher, MD

## 2019-09-12 NOTE — Telephone Encounter (Signed)
I have had PCP sign the form and fax to insurance for benefits verification.

## 2019-09-13 LAB — CMP14+EGFR
ALT: 10 IU/L (ref 0–32)
AST: 16 IU/L (ref 0–40)
Albumin/Globulin Ratio: 1.8 (ref 1.2–2.2)
Albumin: 4.7 g/dL (ref 3.8–4.8)
Alkaline Phosphatase: 69 IU/L (ref 39–117)
BUN/Creatinine Ratio: 18 (ref 12–28)
BUN: 13 mg/dL (ref 8–27)
Bilirubin Total: 0.6 mg/dL (ref 0.0–1.2)
CO2: 25 mmol/L (ref 20–29)
Calcium: 10 mg/dL (ref 8.7–10.3)
Chloride: 101 mmol/L (ref 96–106)
Creatinine, Ser: 0.74 mg/dL (ref 0.57–1.00)
GFR calc Af Amer: 95 mL/min/{1.73_m2} (ref >59)
GFR calc non Af Amer: 82 mL/min/{1.73_m2} (ref >59)
Globulin, Total: 2.6 g/dL (ref 1.5–4.5)
Glucose: 74 mg/dL (ref 65–99)
Potassium: 5.1 mmol/L (ref 3.5–5.2)
Sodium: 142 mmol/L (ref 134–144)
Total Protein: 7.3 g/dL (ref 6.0–8.5)

## 2019-09-13 LAB — IRON,TIBC AND FERRITIN PANEL
Ferritin: 37 ng/mL (ref 15–150)
Iron Saturation: 32 % (ref 15–55)
Iron: 113 ug/dL (ref 27–139)
Total Iron Binding Capacity: 356 ug/dL (ref 250–450)
UIBC: 243 ug/dL (ref 118–369)

## 2019-09-13 LAB — LIPID PANEL W/REFLEX DIRECT LDL
Cholesterol: 235 mg/dL — ABNORMAL HIGH (ref <200)
HDL: 82 mg/dL (ref >=50)
LDL Cholesterol (Calc): 123 mg/dL (calc) — ABNORMAL HIGH
Non-HDL Cholesterol (Calc): 153 mg/dL (calc) — ABNORMAL HIGH (ref <130)
Total CHOL/HDL Ratio: 2.9 (calc) (ref <5.0)
Triglycerides: 180 mg/dL — ABNORMAL HIGH (ref <150)

## 2019-09-13 LAB — LIPID PANEL
Chol/HDL Ratio: 2.8 ratio (ref 0.0–4.4)
Cholesterol, Total: 246 mg/dL — ABNORMAL HIGH (ref 100–199)
HDL: 88 mg/dL (ref >39)
LDL Chol Calc (NIH): 134 mg/dL — ABNORMAL HIGH (ref 0–99)
Triglycerides: 138 mg/dL (ref 0–149)
VLDL Cholesterol Cal: 24 mg/dL (ref 5–40)

## 2019-09-13 LAB — CBC
Hematocrit: 48 % — ABNORMAL HIGH (ref 34.0–46.6)
Hemoglobin: 15.8 g/dL (ref 11.1–15.9)
MCH: 29.3 pg (ref 26.6–33.0)
MCHC: 32.9 g/dL (ref 31.5–35.7)
MCV: 89 fL (ref 79–97)
Platelets: 327 10*3/uL (ref 150–450)
RBC: 5.4 x10E6/uL — ABNORMAL HIGH (ref 3.77–5.28)
RDW: 12.7 % (ref 11.7–15.4)
WBC: 5.9 10*3/uL (ref 3.4–10.8)

## 2019-09-13 LAB — TSH: TSH: 1.65 u[IU]/mL (ref 0.450–4.500)

## 2019-09-13 LAB — COMPLETE METABOLIC PANEL WITH GFR

## 2019-09-13 NOTE — Telephone Encounter (Signed)
Will address with [facility]

## 2019-09-14 NOTE — Telephone Encounter (Signed)
Received benefits and patient has an appointment set up. No other questions.

## 2019-09-17 MED ORDER — ROPINIROLE HCL 0.25 MG PO TABS: 0.2500 mg | ORAL_TABLET | Freq: Every day | ORAL | 2 refills | Status: DC

## 2019-09-17 NOTE — Addendum Note (Signed)
Addended by: Beatrice Lecher D on: 09/17/2019 12:42 PM   Modules accepted: Orders

## 2019-09-20 ENCOUNTER — Ambulatory Visit (INDEPENDENT_AMBULATORY_CARE_PROVIDER_SITE_OTHER): Payer: Medicare Other | Admitting: Nurse Practitioner

## 2019-09-20 ENCOUNTER — Other Ambulatory Visit: Payer: Self-pay

## 2019-09-20 VITALS — Temp 98.4°F | Ht 60.0 in | Wt 140.0 lb

## 2019-09-20 DIAGNOSIS — M810 Age-related osteoporosis without current pathological fracture: Secondary | ICD-10-CM

## 2019-09-20 MED ORDER — DENOSUMAB 60 MG/ML ~~LOC~~ SOSY
60.0000 mg | PREFILLED_SYRINGE | Freq: Once | SUBCUTANEOUS | Status: AC
  Administered 2019-09-20: 60 mg via SUBCUTANEOUS

## 2019-09-20 NOTE — Progress Notes (Signed)
Patient in the office for her first prolia injection. She waited a couple of minutes to see if she had a reaction and no reaction while in the office. She tolerated the injection well subcutaneously in her right arm with no immediate reactions. She is going to call back with any questions. She will call about a month ahead of time to get the medication authorized and get labs before the next injection is due.  No other questions at this time.

## 2019-10-14 ENCOUNTER — Other Ambulatory Visit: Payer: Self-pay

## 2019-10-14 ENCOUNTER — Ambulatory Visit: Payer: Medicare Other | Attending: Internal Medicine

## 2019-10-14 DIAGNOSIS — Z23 Encounter for immunization: Secondary | ICD-10-CM

## 2019-10-14 NOTE — Progress Notes (Signed)
   Covid-19 Vaccination Clinic  Name:  Valerie Fisher    MRN: [id number] DOB: [birth year]  10/14/2019  Valerie Fisher was observed post Covid-19 immunization for 15 minutes without incidence. She was provided with Vaccine Information Sheet and instruction to access the V-Safe system.   Valerie Fisher was instructed to call 911 with any severe reactions post vaccine: Marland Kitchen Difficulty breathing  . Swelling of your face and throat  . A fast heartbeat  . A bad rash all over your body  . Dizziness and weakness    Immunizations Administered    Name Date Dose VIS Date Route   Pfizer COVID-19 Vaccine 10/14/2019  9:02 AM 0.3 mL 07/27/2019 Intramuscular   Manufacturer: [place]   Lot: [id number]   [place]: [id number]

## 2019-11-07 ENCOUNTER — Ambulatory Visit: Payer: Medicare Other | Attending: Internal Medicine

## 2019-11-07 DIAGNOSIS — Z23 Encounter for immunization: Secondary | ICD-10-CM

## 2019-11-07 NOTE — Progress Notes (Signed)
   Covid-19 Vaccination Clinic  Name:  Valerie Fisher    MRN: [id number] DOB: [birth year]  11/07/2019  [name] was observed post Covid-19 immunization for 15 minutes without incident. She was provided with Vaccine Information Sheet and instruction to access the V-Safe system.   [name] was instructed to call 911 with any severe reactions post vaccine: Marland Kitchen Difficulty breathing  . Swelling of face and throat  . A fast heartbeat  . A bad rash all over body  . Dizziness and weakness   Immunizations Administered    Name Date Dose VIS Date Route   Pfizer COVID-19 Vaccine 11/07/2019 11:16 AM 0.3 mL 07/27/2019 Intramuscular   Manufacturer: [place]   Lot: [id number]   [place]: [id number]

## 2019-11-08 ENCOUNTER — Other Ambulatory Visit: Payer: Self-pay

## 2019-11-08 ENCOUNTER — Ambulatory Visit (INDEPENDENT_AMBULATORY_CARE_PROVIDER_SITE_OTHER): Payer: Medicare Other | Admitting: Family Medicine

## 2019-11-08 ENCOUNTER — Encounter: Payer: Self-pay | Admitting: Family Medicine

## 2019-11-08 VITALS — BP 120/68 | HR 77 | Ht 60.0 in | Wt 140.0 lb

## 2019-11-08 DIAGNOSIS — F5101 Primary insomnia: Secondary | ICD-10-CM | POA: Diagnosis not present

## 2019-11-08 DIAGNOSIS — J0100 Acute maxillary sinusitis, unspecified: Secondary | ICD-10-CM | POA: Diagnosis not present

## 2019-11-08 DIAGNOSIS — H9203 Otalgia, bilateral: Secondary | ICD-10-CM | POA: Diagnosis not present

## 2019-11-08 MED ORDER — AMOXICILLIN-POT CLAVULANATE 875-125 MG PO TABS: 1.0000 | ORAL_TABLET | Freq: Two times a day (BID) | ORAL | 0 refills | Status: DC

## 2019-11-08 NOTE — Progress Notes (Signed)
Acute Office Visit  Subjective:    Patient ID: Valerie Fisher, female    DOB: Mar 15, 1951, 70 y.o.   MRN: [id number]  Chief Complaint  Patient presents with  . Sinusitis    x 6 days    HPI Patient is in today for ear pain and dizziness x 6 days.    She says she has bilateral ear pressure as well as nasal congestion and sinus pressure in both maxillary sinuses radiating into her upper teeth.  She says she is not blowing a lot of discolored mucus.  She has been taking her Zyrtec as well as her nasal spray.  She says the pressure gets that bad enough that she actually feels a little bit dizzy at times.  She also complains of difficulty falling asleep she feels like her mind is racing.  She has been taking her ropinirole for her restless leg.  She says that she just will lay there awake sometimes even for hours but then she will sleep in the next day.  She says her husband also goes to bed later than her and every time he gets into bed it also wakes her up.  She is also had a lot of shoulder discomfort when she first lays down that she thinks might be contributing as well.  Past Medical History:  Diagnosis Date  . History of diverticular abscess of colon 12/12/2017    Past Surgical History:  Procedure Laterality Date  . COLOSTOMY    . COLOSTOMY REVERSAL  02/2019    Family History  Problem Relation Age of Onset  . Diabetes Father   . Heart attack Father        mid 48s    Social History   Socioeconomic History  . Marital status: Married    Spouse name: BJ  . Number of children: 3  . Years of education: Not on file  . Highest education level: Not on file  Occupational History  . Occupation: retired Pharmacist, hospital  Tobacco Use  . Smoking status: Never Smoker  . Smokeless tobacco: Never Used  Substance and Sexual Activity  . Alcohol use: Yes    Alcohol/week: 0.0 standard drinks    Comment: .5 glass once a month  . Drug use: Never  . Sexual activity: Yes    Partners: Male     Birth control/protection: None  Other Topics Concern  . Not on file  Social History Narrative  . Not on file   Social Determinants of Health   Financial Resource Strain:   . Difficulty of Paying Living Expenses:   Food Insecurity:   . Worried About Charity fundraiser in the Last Year:   . Arboriculturist in the Last Year:   Transportation Needs:   . Film/video editor (Medical):   Marland Kitchen Lack of Transportation (Non-Medical):   Physical Activity:   . Days of Exercise per Week:   . Minutes of Exercise per Session:   Stress:   . Feeling of Stress :   Social Connections:   . Frequency of Communication with Friends and Family:   . Frequency of Social Gatherings with Friends and Family:   . Attends Religious Services:   . Active Member of Clubs or Organizations:   . Attends Archivist Meetings:   Marland Kitchen Marital Status:   Intimate Partner Violence:   . Fear of Current or Ex-Partner:   . Emotionally Abused:   Marland Kitchen Physically Abused:   . Sexually Abused:  Outpatient Medications Prior to Visit  Medication Sig Dispense Refill  . levETIRAcetam (KEPPRA) 500 MG tablet Take 500 mg by mouth 3 (three) times daily.     . Loratadine 10 MG CAPS Take 1 capsule by mouth as needed.    Marland Kitchen rOPINIRole (REQUIP) 0.25 MG tablet Take 1-3 tablets (0.25-0.75 mg total) by mouth at bedtime. 60 tablet 2  . sertraline (ZOLOFT) 50 MG tablet Take 1 tablet (50 mg total) by mouth daily. 90 tablet 1   No facility-administered medications prior to visit.    Allergies  Allergen Reactions  . Aspirin Other (See Comments)    Other reaction(s): GI Upset (intolerance), Other GI upset GI upset   . Hydrocodone-Acetaminophen Nausea And Vomiting  . Hydromorphone Hcl Nausea And Vomiting  . Morphine Nausea And Vomiting  . Pseudoephedrine Hcl Nausea And Vomiting    Review of Systems     Objective:    Physical Exam Constitutional:      Appearance: She is well-developed.  HENT:     Head: Normocephalic  and atraumatic.     Right Ear: External ear normal.     Left Ear: External ear normal.     Nose: Nose normal.  Eyes:     Conjunctiva/sclera: Conjunctivae normal.     Pupils: Pupils are equal, round, and reactive to light.  Neck:     Thyroid: No thyromegaly.  Cardiovascular:     Rate and Rhythm: Normal rate and regular rhythm.     Heart sounds: Normal heart sounds.  Pulmonary:     Effort: Pulmonary effort is normal.     Breath sounds: Normal breath sounds. No wheezing.  Musculoskeletal:     Cervical back: Neck supple.  Lymphadenopathy:     Cervical: No cervical adenopathy.  Skin:    General: Skin is warm and dry.  Neurological:     Mental Status: She is alert and oriented to person, place, and time.     BP 120/68   Pulse 77   Ht 5' (1.524 m)   Wt 140 lb (63.5 kg)   SpO2 100%   BMI 27.34 kg/m  Wt Readings from Last 3 Encounters:  11/08/19 140 lb (63.5 kg)  09/20/19 140 lb (63.5 kg)  09/11/19 135 lb (61.2 kg)    Health Maintenance Due  Topic Date Due  . Hepatitis C Screening  Never done  . COLONOSCOPY  Never done  . DEXA SCAN  Never done    There are no preventive care reminders to display for this patient.   Lab Results  Component Value Date   TSH 1.650 09/12/2019   Lab Results  Component Value Date   WBC 5.9 09/12/2019   HGB 15.8 09/12/2019   HCT 48.0 (H) 09/12/2019   MCV 89 09/12/2019   PLT 327 09/12/2019   Lab Results  Component Value Date   NA 142 09/12/2019   K 5.1 09/12/2019   CO2 25 09/12/2019   GLUCOSE 74 09/12/2019   BUN 13 09/12/2019   CREATININE 0.74 09/12/2019   BILITOT 0.6 09/12/2019   ALKPHOS 69 09/12/2019   AST 16 09/12/2019   ALT 10 09/12/2019   PROT 7.3 09/12/2019   ALBUMIN 4.7 09/12/2019   CALCIUM 10.0 09/12/2019   Lab Results  Component Value Date   CHOL 246 (H) 09/12/2019   Lab Results  Component Value Date   HDL 88 09/12/2019   Lab Results  Component Value Date   LDLCALC 134 (H) 09/12/2019   Lab Results  Component Value Date   TRIG 138 09/12/2019   Lab Results  Component Value Date   CHOLHDL 2.8 09/12/2019   No results found for: HGBA1C     Assessment & Plan:   Problem List Items Addressed This Visit      Other   Insomnia    Other Visit Diagnoses    Acute non-recurrent maxillary sinusitis    -  Primary   Relevant Medications   amoxicillin-clavulanate (AUGMENTIN) 875-125 MG tablet   Otalgia of both ears         Sinusitis  - will tx with augment. Continue allergy medication.  If not better in 1 week.  Insomnia-discussed sleep hygiene including avoiding caffeine setting a set bedtime and wake time.  Avoiding screen time an hour before going to bed.  Any been having a discussion with her husband about coming to bed later which often wakes her up.  We also discussed relaxation techniques at bedtime to try to get her mind to stop racing.  She can also consider a trial over-the-counter of melatonin or even Benadryl.  She could even consider Tylenol for her shoulder pain to see if that helps her get a little bit more relaxed.  She says she might even consider getting a new mattress as well.  Meds ordered this encounter  Medications  . amoxicillin-clavulanate (AUGMENTIN) 875-125 MG tablet    Sig: Take 1 tablet by mouth 2 (two) times daily.    Dispense:  14 tablet    Refill:  0     Beatrice Lecher, MD

## 2019-11-08 NOTE — Patient Instructions (Addendum)

## 2019-11-13 ENCOUNTER — Ambulatory Visit: Payer: Medicare Other

## 2019-11-19 ENCOUNTER — Other Ambulatory Visit: Payer: Self-pay | Admitting: Family Medicine

## 2019-11-19 DIAGNOSIS — F411 Generalized anxiety disorder: Secondary | ICD-10-CM

## 2020-01-17 ENCOUNTER — Telehealth: Payer: Self-pay

## 2020-01-17 NOTE — Telephone Encounter (Signed)
Patient called said she had discussed stopping Zoloft with Dr Madilyn Fireman at appt on 11/08/19. She is wanting to restart. Wanting to know if she should restart on the 50 or 25 mg. (RX for 50 mg sent to pharmacy on 11/19/19 for #90 with 1 RF)

## 2020-01-18 NOTE — Telephone Encounter (Signed)
Called patient and left VM with directions on restarting medication. Left call back information

## 2020-01-18 NOTE — Telephone Encounter (Signed)
Restart 1/2 tab QD x 10 days then increase to whole tab daily

## 2020-01-18 NOTE — Telephone Encounter (Signed)
Patient called back, confirmed she received msg and will begin to taper up

## 2020-02-11 ENCOUNTER — Other Ambulatory Visit: Payer: Self-pay | Admitting: Family Medicine

## 2020-02-12 ENCOUNTER — Telehealth: Payer: Self-pay

## 2020-02-12 ENCOUNTER — Other Ambulatory Visit: Payer: Self-pay | Admitting: Family Medicine

## 2020-02-12 NOTE — Telephone Encounter (Signed)
Routing to covering provider. Pt called requesting med refills for Keppra. Rx written by historical provider. Per pt, taking 3 tabs (bid). Pls send the rx to [facility].

## 2020-02-13 MED ORDER — LEVETIRACETAM 500 MG PO TABS: 1500.0000 mg | ORAL_TABLET | Freq: Two times a day (BID) | ORAL | 0 refills | Status: DC

## 2020-02-13 NOTE — Telephone Encounter (Signed)
Refill sent x30 days Further requests to be filled by PCP, 90 days ok if she decides it is

## 2020-02-14 NOTE — Telephone Encounter (Signed)
Patient advised. Has upcoming appointment with Dr Madilyn Fireman and will discuss then

## 2020-03-12 ENCOUNTER — Telehealth: Payer: Self-pay

## 2020-03-12 DIAGNOSIS — M810 Age-related osteoporosis without current pathological fracture: Secondary | ICD-10-CM

## 2020-03-12 NOTE — Telephone Encounter (Signed)
Patient is due for a Prolia injection on 03/19/2020 or after. A PA needs to be done along with CMP prior to scheduling. Also make sure we have in stock.   Barnet Pall can you call for the PA?

## 2020-03-12 NOTE — Telephone Encounter (Signed)
I have faxed all the information to insurance and waiting on a response.

## 2020-03-13 NOTE — Telephone Encounter (Signed)
Patient is going to [facility] to get labs done and labs are faxed. She will go to get labs this week or first of next week.

## 2020-03-14 DIAGNOSIS — M810 Age-related osteoporosis without current pathological fracture: Secondary | ICD-10-CM | POA: Diagnosis not present

## 2020-03-14 NOTE — Telephone Encounter (Signed)
Received a fax from [place] that [facility] does not require a PA. Patient has a injection with her name on this in the refrigerator. No other concerns. Form will be sent to scan.

## 2020-03-15 LAB — COMPREHENSIVE METABOLIC PANEL
ALT: 15 IU/L (ref 0–32)
AST: 21 IU/L (ref 0–40)
Albumin/Globulin Ratio: 1.9 (ref 1.2–2.2)
Albumin: 4.6 g/dL (ref 3.7–4.7)
Alkaline Phosphatase: 56 IU/L (ref 48–121)
BUN/Creatinine Ratio: 16 (ref 12–28)
BUN: 14 mg/dL (ref 8–27)
Bilirubin Total: 0.7 mg/dL (ref 0.0–1.2)
CO2: 25 mmol/L (ref 20–29)
Calcium: 10 mg/dL (ref 8.7–10.3)
Chloride: 100 mmol/L (ref 96–106)
Creatinine, Ser: 0.86 mg/dL (ref 0.57–1.00)
GFR calc Af Amer: 79 mL/min/{1.73_m2} (ref >59)
GFR calc non Af Amer: 68 mL/min/{1.73_m2} (ref >59)
Globulin, Total: 2.4 g/dL (ref 1.5–4.5)
Glucose: 104 mg/dL — ABNORMAL HIGH (ref 65–99)
Potassium: 4.6 mmol/L (ref 3.5–5.2)
Sodium: 140 mmol/L (ref 134–144)
Total Protein: 7 g/dL (ref 6.0–8.5)

## 2020-03-17 NOTE — Telephone Encounter (Signed)
All labs are normal. 

## 2020-03-19 ENCOUNTER — Ambulatory Visit: Payer: Medicare Other

## 2020-03-19 ENCOUNTER — Ambulatory Visit (INDEPENDENT_AMBULATORY_CARE_PROVIDER_SITE_OTHER): Payer: Medicare Other | Admitting: Family Medicine

## 2020-03-19 ENCOUNTER — Other Ambulatory Visit: Payer: Self-pay

## 2020-03-19 VITALS — BP 107/74 | HR 72 | Temp 98.4°F | Wt 142.0 lb

## 2020-03-19 DIAGNOSIS — M810 Age-related osteoporosis without current pathological fracture: Secondary | ICD-10-CM

## 2020-03-19 MED ORDER — DENOSUMAB 60 MG/ML ~~LOC~~ SOSY: 60.0000 mg | PREFILLED_SYRINGE | Freq: Once | SUBCUTANEOUS | 0 refills | Status: DC

## 2020-03-19 MED ORDER — DENOSUMAB 60 MG/ML ~~LOC~~ SOSY
60.0000 mg | PREFILLED_SYRINGE | Freq: Once | SUBCUTANEOUS | Status: AC
Start: 2020-03-19 — End: 2020-03-19
  Administered 2020-03-19: 60 mg via SUBCUTANEOUS

## 2020-03-19 NOTE — Progress Notes (Signed)
Agree with documentation as above.   Lundon Rosier, MD  

## 2020-03-19 NOTE — Progress Notes (Signed)
Pt is here for a Prolia injection. Patient reports taking calcium and vitamin D daily. Pt's calcium levels and kidney function was within normal limits. Pt tolerated injection on Left arm without any complications. Pt advised to schedule next injection in 6 months.

## 2020-04-28 DIAGNOSIS — Z8669 Personal history of other diseases of the nervous system and sense organs: Secondary | ICD-10-CM | POA: Diagnosis not present

## 2020-05-13 DIAGNOSIS — Z79899 Other long term (current) drug therapy: Secondary | ICD-10-CM | POA: Diagnosis not present

## 2020-05-13 DIAGNOSIS — R4781 Slurred speech: Secondary | ICD-10-CM | POA: Diagnosis not present

## 2020-05-13 DIAGNOSIS — Z853 Personal history of malignant neoplasm of breast: Secondary | ICD-10-CM | POA: Diagnosis not present

## 2020-05-13 DIAGNOSIS — R410 Disorientation, unspecified: Secondary | ICD-10-CM | POA: Diagnosis not present

## 2020-05-30 ENCOUNTER — Other Ambulatory Visit: Payer: Self-pay

## 2020-05-30 ENCOUNTER — Ambulatory Visit (INDEPENDENT_AMBULATORY_CARE_PROVIDER_SITE_OTHER): Payer: Medicare Other | Admitting: Family Medicine

## 2020-05-30 VITALS — BP 102/69 | HR 75 | Temp 98.1°F | Wt 142.6 lb

## 2020-05-30 DIAGNOSIS — Z23 Encounter for immunization: Secondary | ICD-10-CM | POA: Diagnosis not present

## 2020-05-30 NOTE — Progress Notes (Signed)
Patient presented for flu vaccine.   LD High dose 0.75mL  Patient tolerated well.

## 2020-06-03 DIAGNOSIS — R9082 White matter disease, unspecified: Secondary | ICD-10-CM | POA: Diagnosis not present

## 2020-06-03 DIAGNOSIS — G319 Degenerative disease of nervous system, unspecified: Secondary | ICD-10-CM | POA: Diagnosis not present

## 2020-06-11 DIAGNOSIS — Z853 Personal history of malignant neoplasm of breast: Secondary | ICD-10-CM | POA: Diagnosis not present

## 2020-07-02 ENCOUNTER — Encounter: Payer: Self-pay | Admitting: Family Medicine

## 2020-07-17 ENCOUNTER — Other Ambulatory Visit: Payer: Self-pay | Admitting: Family Medicine

## 2020-07-17 DIAGNOSIS — F411 Generalized anxiety disorder: Secondary | ICD-10-CM

## 2020-09-19 ENCOUNTER — Other Ambulatory Visit: Payer: Self-pay

## 2020-09-19 ENCOUNTER — Telehealth: Payer: Self-pay | Admitting: Family Medicine

## 2020-09-19 ENCOUNTER — Ambulatory Visit: Payer: Medicare Other

## 2020-09-19 DIAGNOSIS — M810 Age-related osteoporosis without current pathological fracture: Secondary | ICD-10-CM

## 2020-09-19 NOTE — Progress Notes (Signed)
Ordered CMP through [employer].

## 2020-09-19 NOTE — Telephone Encounter (Signed)
Pt called to make an appt for abdominal pain. I spoke with Levada Dy and she told me to let Beau know she needs to go to the urgent care since we do not have anything open today with any providers. She asked if we can just wait until Monday but I informed her that the nurses advice is to be seen by urgent care today. Hennessy understood and said she will go be seen.

## 2020-09-19 NOTE — Telephone Encounter (Signed)
OK 

## 2020-09-22 ENCOUNTER — Other Ambulatory Visit: Payer: Self-pay

## 2020-09-22 ENCOUNTER — Ambulatory Visit (INDEPENDENT_AMBULATORY_CARE_PROVIDER_SITE_OTHER): Payer: Medicare Other | Admitting: Family Medicine

## 2020-09-22 ENCOUNTER — Encounter: Payer: Self-pay | Admitting: Family Medicine

## 2020-09-22 VITALS — BP 132/89 | HR 89 | Ht 60.0 in | Wt 141.0 lb

## 2020-09-22 DIAGNOSIS — Z8719 Personal history of other diseases of the digestive system: Secondary | ICD-10-CM | POA: Diagnosis not present

## 2020-09-22 DIAGNOSIS — R1032 Left lower quadrant pain: Secondary | ICD-10-CM | POA: Diagnosis not present

## 2020-09-22 DIAGNOSIS — F411 Generalized anxiety disorder: Secondary | ICD-10-CM | POA: Diagnosis not present

## 2020-09-22 DIAGNOSIS — R197 Diarrhea, unspecified: Secondary | ICD-10-CM | POA: Diagnosis not present

## 2020-09-22 DIAGNOSIS — K589 Irritable bowel syndrome without diarrhea: Secondary | ICD-10-CM

## 2020-09-22 MED ORDER — SERTRALINE HCL 100 MG PO TABS: 100.0000 mg | ORAL_TABLET | Freq: Every day | ORAL | 1 refills | Status: DC

## 2020-09-22 MED ORDER — DICYCLOMINE HCL 10 MG PO CAPS: 10.0000 mg | ORAL_CAPSULE | Freq: Three times a day (TID) | ORAL | 3 refills | Status: DC

## 2020-09-22 NOTE — Progress Notes (Signed)
Acute Office Visit  Subjective:    Patient ID: Valerie Fisher, female    DOB: 09/30/1950, 70 y.o.   MRN: [id number]  Chief Complaint  Patient presents with  . Abdominal Pain    HPI Patient is in today for diarrhea that started on Thur and Friday aof last week. Better over the weekend.   Has been eating more bland foods and tiny amt since then. Last BM was 2 days ago and had some form. No fever.  Did see some blood that was bright red with stool.  She is actually feeling better today she is not having the cramping and diarrhea.  She still little bit tender in the left lower quadrant.  She just nervous because she has had diverticulitis that led to an abscess which ruptured and she ended up with a colectomy.  No fevers or chills or sweats.  She did find some old Bentyl which had expired and says she took some of it and it did provide some relief.  She also wondered if she could go up on her Zoloft she feels like it is just not working as good as it used to and wonders if that would help boost her mood a little bit.  She feels a little frustrated with her husband who seems to not want to  Past Medical History:  Diagnosis Date  . History of diverticular abscess of colon 12/12/2017    Past Surgical History:  Procedure Laterality Date  . COLOSTOMY    . COLOSTOMY REVERSAL  02/2019    Family History  Problem Relation Age of Onset  . Diabetes Father   . Heart attack Father        mid 40s    Social History   Socioeconomic History  . Marital status: Married    Spouse name: Valerie Fisher  . Number of children: 3  . Years of education: Not on file  . Highest education level: Not on file  Occupational History  . Occupation: retired Pharmacist, hospital  Tobacco Use  . Smoking status: Never Smoker  . Smokeless tobacco: Never Used  Substance and Sexual Activity  . Alcohol use: Yes    Alcohol/week: 0.0 standard drinks    Comment: .5 glass once a month  . Drug use: Never  . Sexual activity: Yes     Partners: Male    Birth control/protection: None  Other Topics Concern  . Not on file  Social History Narrative  . Not on file   Social Determinants of Health   Financial Resource Strain: Not on file  Food Insecurity: Not on file  Transportation Needs: Not on file  Physical Activity: Not on file  Stress: Not on file  Social Connections: Not on file  Intimate Partner Violence: Not on file    Outpatient Medications Prior to Visit  Medication Sig Dispense Refill  . topiramate (TOPAMAX) 100 MG tablet Take 100 mg by mouth 2 (two) times daily. 1 TAB IN THE MORNING AND 1 TAB IN THE EVENING  11  . sertraline (ZOLOFT) 50 MG tablet Take 1 tablet (50 mg total) by mouth daily. 90 tablet 1  . levETIRAcetam (KEPPRA) 500 MG tablet Take 3 tablets (1,500 mg total) by mouth 2 (two) times daily. 180 tablet 0  . Loratadine 10 MG CAPS Take 1 capsule by mouth as needed.    Marland Kitchen rOPINIRole (REQUIP) 0.25 MG tablet Take 1-3 tablets (0.25-0.75 mg total) by mouth at bedtime. 60 tablet 2   No facility-administered medications prior to  visit.    Allergies  Allergen Reactions  . Aspirin Other (See Comments)    Other reaction(s): GI Upset (intolerance), Other GI upset GI upset   . Hydrocodone-Acetaminophen Nausea And Vomiting  . Hydromorphone Hcl Nausea And Vomiting  . Morphine Nausea And Vomiting  . Pseudoephedrine Hcl Nausea And Vomiting    Review of Systems     Objective:    Physical Exam Constitutional:      Appearance: She is well-developed and well-nourished.  HENT:     Head: Normocephalic and atraumatic.  Cardiovascular:     Rate and Rhythm: Normal rate and regular rhythm.     Heart sounds: Normal heart sounds.  Pulmonary:     Effort: Pulmonary effort is normal.     Breath sounds: Normal breath sounds.  Abdominal:     Tenderness: There is abdominal tenderness in the left lower quadrant. There is no guarding or rebound.  Skin:    General: Skin is warm and dry.  Neurological:      Mental Status: She is alert and oriented to person, place, and time.  Psychiatric:        Mood and Affect: Mood and affect normal.        Behavior: Behavior normal.     BP 132/89   Pulse 89   Ht 5' (1.524 m)   Wt 141 lb (64 kg)   SpO2 100%   BMI 27.54 kg/m  Wt Readings from Last 3 Encounters:  09/22/20 141 lb (64 kg)  05/30/20 142 lb 9.6 oz (64.7 kg)  03/19/20 142 lb (64.4 kg)    Health Maintenance Due  Topic Date Due  . Hepatitis C Screening  Never done  . COLONOSCOPY (Pts 45-33yr Insurance coverage will need to be confirmed)  Never done  . COVID-19 Vaccine (3 - Booster for Pfizer series) 05/09/2020    There are no preventive care reminders to display for this patient.   Lab Results  Component Value Date   TSH 1.650 09/12/2019   Lab Results  Component Value Date   WBC 5.9 09/12/2019   HGB 15.8 09/12/2019   HCT 48.0 (H) 09/12/2019   MCV 89 09/12/2019   PLT 327 09/12/2019   Lab Results  Component Value Date   NA 140 03/14/2020   K 4.6 03/14/2020   CO2 25 03/14/2020   GLUCOSE 104 (H) 03/14/2020   BUN 14 03/14/2020   CREATININE 0.86 03/14/2020   BILITOT 0.7 03/14/2020   ALKPHOS 56 03/14/2020   AST 21 03/14/2020   ALT 15 03/14/2020   PROT 7.0 03/14/2020   ALBUMIN 4.6 03/14/2020   CALCIUM 10.0 03/14/2020   Lab Results  Component Value Date   CHOL 246 (H) 09/12/2019   Lab Results  Component Value Date   HDL 88 09/12/2019   Lab Results  Component Value Date   LDLCALC 134 (H) 09/12/2019   Lab Results  Component Value Date   TRIG 138 09/12/2019   Lab Results  Component Value Date   CHOLHDL 2.8 09/12/2019   No results found for: HGBA1C     Assessment & Plan:   Problem List Items Addressed This Visit      Digestive   History of diverticular abscess of colon    Definitely very fearful of recurrence of this.  But I do not see anything on exam to indicate diverticulitis at this point.  She is actually already feeling some better.         Other   GAD (generalized  anxiety disorder)    Increase Zoloft 200 mg.  New prescription sent a like to see her back in 2 to 3 months just to make sure she is doing well on her new regimen.      Relevant Medications   sertraline (ZOLOFT) 100 MG tablet    Other Visit Diagnoses    Diarrhea, unspecified type    -  Primary   Relevant Orders   CMP14+EGFR   CBC with Differential/Platelet   Stool culture   Left lower quadrant abdominal pain       Relevant Orders   CMP14+EGFR   CBC with Differential/Platelet   Stool culture   Spasm of bowel       Relevant Medications   dicyclomine (BENTYL) 10 MG capsule      Suspect gastroenteritis or either an irritable bowel syndrome flare.  She had been told in the past that she has a history of IBS and sometimes dramatic dietary changes particularly upping fiber can cause diarrhea.  But again I really suspect that she may have eaten something that actually triggered this she had had a large salad the day before it and there have been some multiple recalls on lettuce as recently so certainly that is possible.  If she has another episode of diarrhea then we will get a stool culture.  In the meantime please just check a CBC with differential.  If her white count is normal then that is reassuring if it is elevated that we could consider imaging since she is still tender in that left lower quadrant.  She also requested a refill on the Bentyl which had expired.   Meds ordered this encounter  Medications  . sertraline (ZOLOFT) 100 MG tablet    Sig: Take 1 tablet (100 mg total) by mouth daily.    Dispense:  90 tablet    Refill:  1    This prescription was filled on 04/28/2020. Any refills authorized will be placed on file.  . dicyclomine (BENTYL) 10 MG capsule    Sig: Take 1 capsule (10 mg total) by mouth 4 (four) times daily -  before meals and at bedtime.    Dispense:  120 capsule    Refill:  3     Beatrice Lecher, MD

## 2020-09-22 NOTE — Assessment & Plan Note (Signed)
Increase Zoloft 200 mg.  New prescription sent a like to see her back in 2 to 3 months just to make sure she is doing well on her new regimen.

## 2020-09-22 NOTE — Assessment & Plan Note (Signed)
Definitely very fearful of recurrence of this.  But I do not see anything on exam to indicate diverticulitis at this point.  She is actually already feeling some better.

## 2020-09-22 NOTE — Progress Notes (Signed)
Pt reports that she has a history of IBS and a reversal of a colostomy. She had a flare on Thursday and Friday of explosive diarrhea, Saturday and Sunday she had more formed stools.   She questions if she is experiencing diverticulitis, or if there may be a possible blockage in that area.

## 2020-09-23 LAB — CMP14+EGFR
ALT: 8 IU/L (ref 0–32)
AST: 12 IU/L (ref 0–40)
Albumin/Globulin Ratio: 2 (ref 1.2–2.2)
Albumin: 4.8 g/dL — ABNORMAL HIGH (ref 3.7–4.7)
Alkaline Phosphatase: 58 IU/L (ref 44–121)
BUN/Creatinine Ratio: 13 (ref 12–28)
BUN: 11 mg/dL (ref 8–27)
Bilirubin Total: 0.4 mg/dL (ref 0.0–1.2)
CO2: 21 mmol/L (ref 20–29)
Calcium: 9.7 mg/dL (ref 8.7–10.3)
Chloride: 105 mmol/L (ref 96–106)
Creatinine, Ser: 0.88 mg/dL (ref 0.57–1.00)
GFR calc Af Amer: 76 mL/min/{1.73_m2} (ref >59)
GFR calc non Af Amer: 66 mL/min/{1.73_m2} (ref >59)
Globulin, Total: 2.4 g/dL (ref 1.5–4.5)
Glucose: 97 mg/dL (ref 65–99)
Potassium: 4.6 mmol/L (ref 3.5–5.2)
Sodium: 141 mmol/L (ref 134–144)
Total Protein: 7.2 g/dL (ref 6.0–8.5)

## 2020-09-23 LAB — CBC WITH DIFFERENTIAL/PLATELET
Basophils Absolute: 0 10*3/uL (ref 0.0–0.2)
Basos: 1 %
EOS (ABSOLUTE): 0.2 10*3/uL (ref 0.0–0.4)
Eos: 5 %
Hematocrit: 45.6 % (ref 34.0–46.6)
Hemoglobin: 15.2 g/dL (ref 11.1–15.9)
Immature Grans (Abs): 0 10*3/uL (ref 0.0–0.1)
Immature Granulocytes: 0 %
Lymphocytes Absolute: 1.4 10*3/uL (ref 0.7–3.1)
Lymphs: 27 %
MCH: 29.9 pg (ref 26.6–33.0)
MCHC: 33.3 g/dL (ref 31.5–35.7)
MCV: 90 fL (ref 79–97)
Monocytes Absolute: 0.4 10*3/uL (ref 0.1–0.9)
Monocytes: 7 %
Neutrophils Absolute: 3.1 10*3/uL (ref 1.4–7.0)
Neutrophils: 60 %
Platelets: 327 10*3/uL (ref 150–450)
RBC: 5.08 x10E6/uL (ref 3.77–5.28)
RDW: 12 % (ref 11.7–15.4)
WBC: 5.1 10*3/uL (ref 3.4–10.8)

## 2020-09-24 ENCOUNTER — Telehealth: Payer: Self-pay | Admitting: Family Medicine

## 2020-09-24 ENCOUNTER — Ambulatory Visit (INDEPENDENT_AMBULATORY_CARE_PROVIDER_SITE_OTHER): Payer: Medicare Other | Admitting: Family Medicine

## 2020-09-24 ENCOUNTER — Telehealth: Payer: Self-pay

## 2020-09-24 ENCOUNTER — Other Ambulatory Visit: Payer: Self-pay

## 2020-09-24 DIAGNOSIS — M810 Age-related osteoporosis without current pathological fracture: Secondary | ICD-10-CM

## 2020-09-24 DIAGNOSIS — R1032 Left lower quadrant pain: Secondary | ICD-10-CM

## 2020-09-24 DIAGNOSIS — R197 Diarrhea, unspecified: Secondary | ICD-10-CM

## 2020-09-24 MED ORDER — DENOSUMAB 60 MG/ML ~~LOC~~ SOSY
60.0000 mg | PREFILLED_SYRINGE | Freq: Once | SUBCUTANEOUS | Status: AC
  Administered 2020-09-24: 60 mg via SUBCUTANEOUS

## 2020-09-24 NOTE — Telephone Encounter (Signed)
Pt called to let Dr. Madilyn Fireman know she is still having abdominal pain and so she would order imaging as her and Dr Madilyn Fireman discussed

## 2020-09-24 NOTE — Progress Notes (Signed)
Pt here for Prolia injection which she receives for osteoporosis and this was administered without complication. She was reminded to continue calcium and vitamin D

## 2020-09-24 NOTE — Telephone Encounter (Signed)
No PA is required for Prolia. Reference number - [id number]

## 2020-09-25 NOTE — Telephone Encounter (Signed)
CT ordered. 

## 2020-09-29 ENCOUNTER — Other Ambulatory Visit: Payer: Self-pay

## 2020-09-29 ENCOUNTER — Ambulatory Visit (INDEPENDENT_AMBULATORY_CARE_PROVIDER_SITE_OTHER): Payer: Medicare Other

## 2020-09-29 DIAGNOSIS — N281 Cyst of kidney, acquired: Secondary | ICD-10-CM | POA: Diagnosis not present

## 2020-09-29 DIAGNOSIS — K439 Ventral hernia without obstruction or gangrene: Secondary | ICD-10-CM | POA: Diagnosis not present

## 2020-09-29 DIAGNOSIS — K760 Fatty (change of) liver, not elsewhere classified: Secondary | ICD-10-CM

## 2020-09-29 DIAGNOSIS — K430 Incisional hernia with obstruction, without gangrene: Secondary | ICD-10-CM

## 2020-09-29 DIAGNOSIS — R197 Diarrhea, unspecified: Secondary | ICD-10-CM

## 2020-09-29 DIAGNOSIS — K469 Unspecified abdominal hernia without obstruction or gangrene: Secondary | ICD-10-CM | POA: Diagnosis not present

## 2020-09-29 DIAGNOSIS — K5790 Diverticulosis of intestine, part unspecified, without perforation or abscess without bleeding: Secondary | ICD-10-CM

## 2020-09-29 DIAGNOSIS — R1032 Left lower quadrant pain: Secondary | ICD-10-CM

## 2020-09-29 IMAGING — CT CT ABD-PELV W/ CM
2 of 6 series · 15 of 46 positions shown, 17 images · IV contrast (APPLIED)
Comparison: None.

CLINICAL DATA: Left lower quadrant pain

EXAM:
CT ABDOMEN AND PELVIS WITH CONTRAST
TECHNIQUE: Multidetector CT imaging of the abdomen and pelvis was performed
using the standard protocol following bolus administration of
intravenous contrast.
CONTRAST:  100mL OMNIPAQUE IOHEXOL 300 MG/ML  SOLN

[Series 2: axial st · axial · 0.63mm/px · z∈[+672,+1052]mm · 12 of 88 slices shown, 14 images]
[im 6/88  soft-tissue]
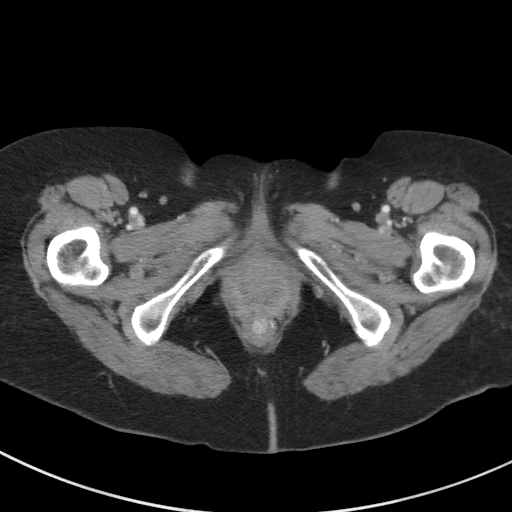
[im 6/88  bone]
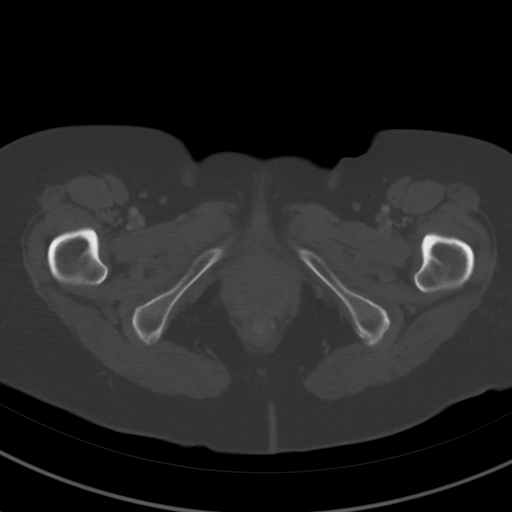
[im 16/88  soft-tissue]
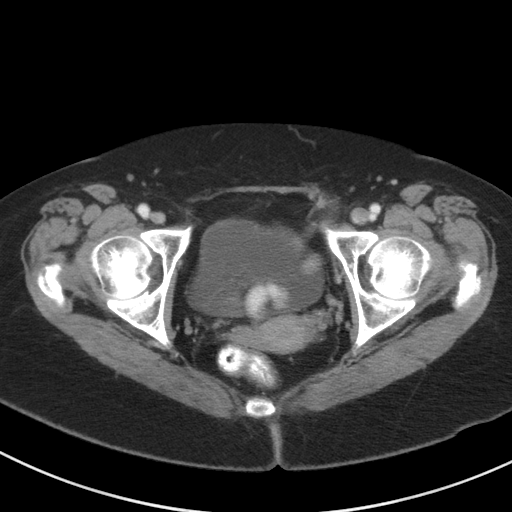
[im 21/88  soft-tissue]
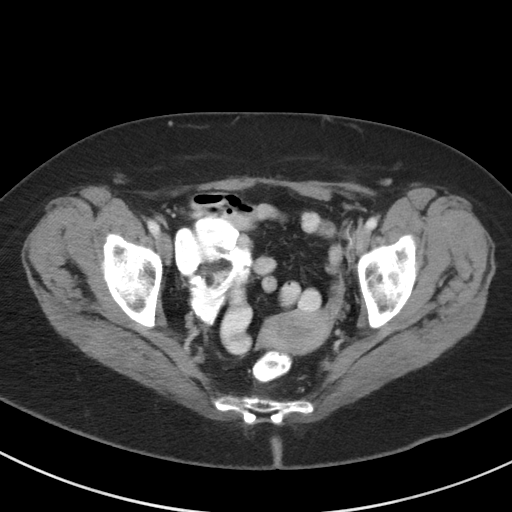
[im 26/88  soft-tissue]
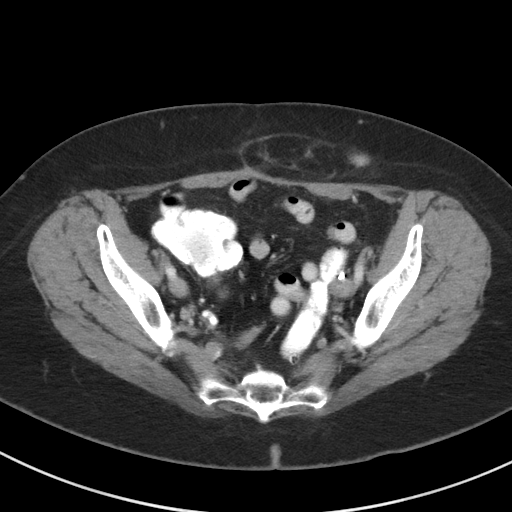
[im 36/88  soft-tissue]
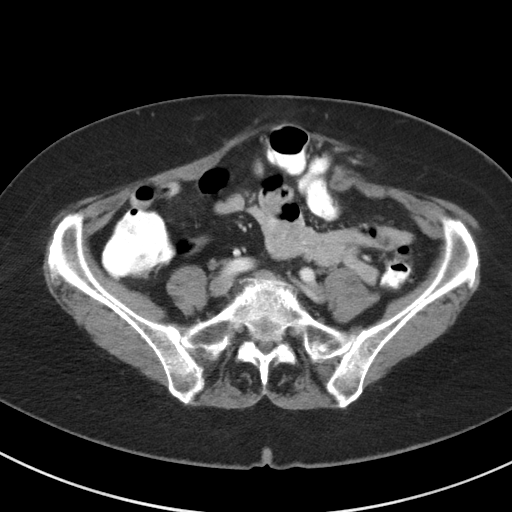
[im 41/88  soft-tissue]
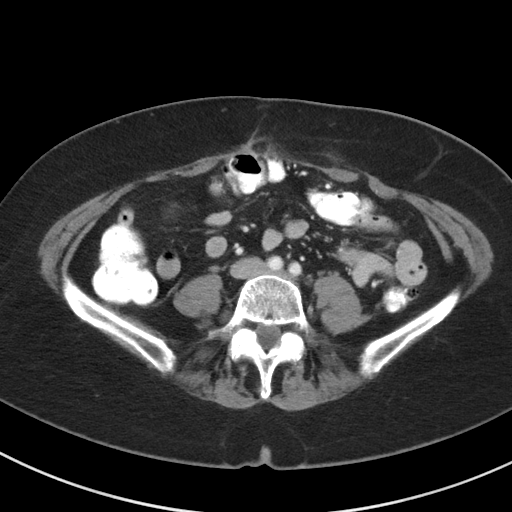
[im 47/88  soft-tissue]
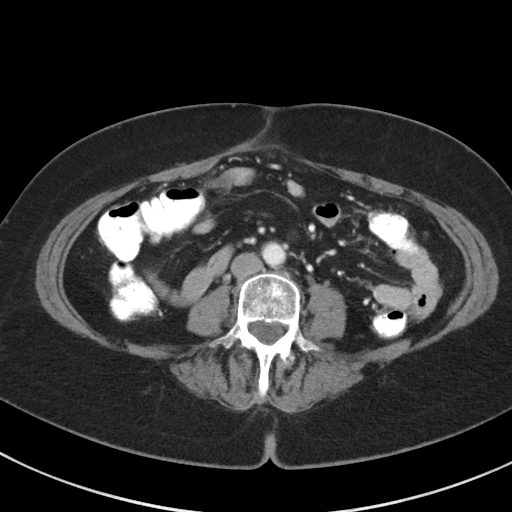
[im 57/88  soft-tissue]
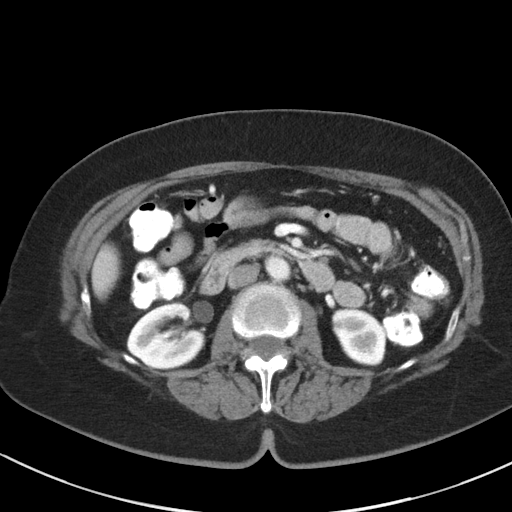
[im 62/88  soft-tissue]
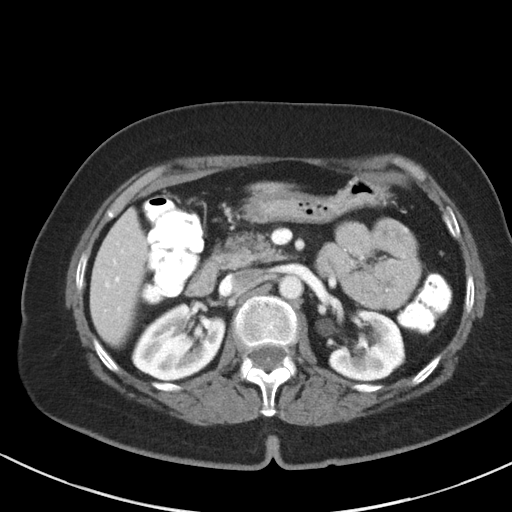
[im 62/88  bone]
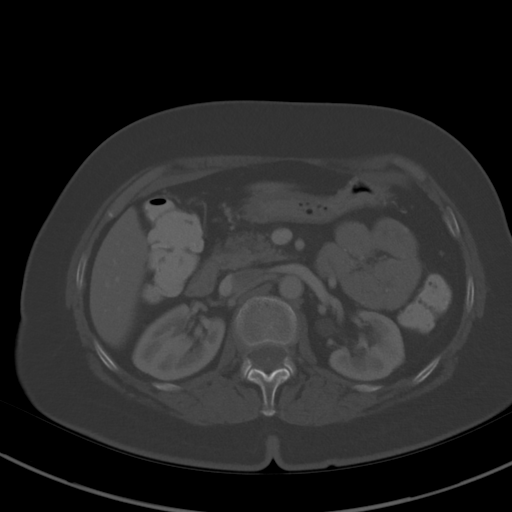
[im 67/88  soft-tissue]
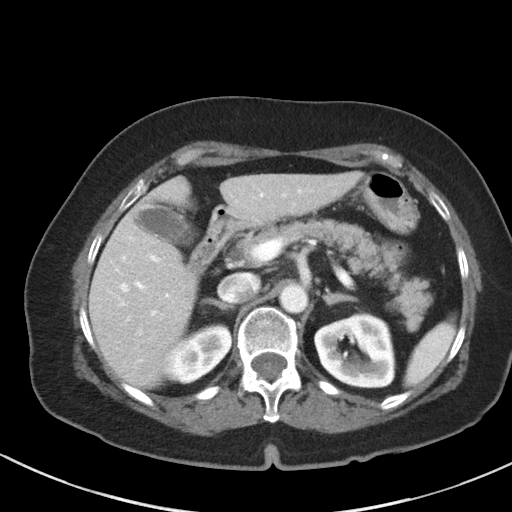
[im 77/88  soft-tissue]
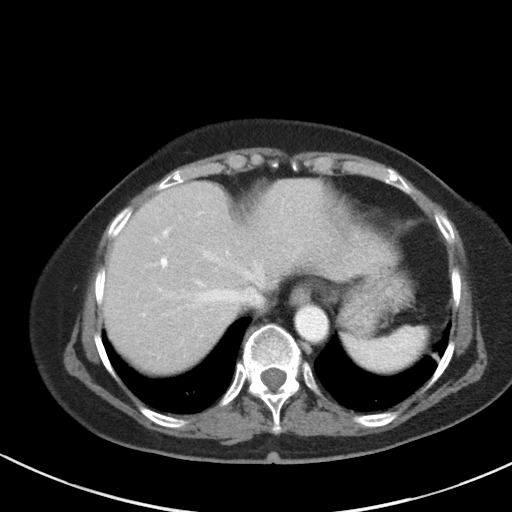
[im 82/88  soft-tissue]
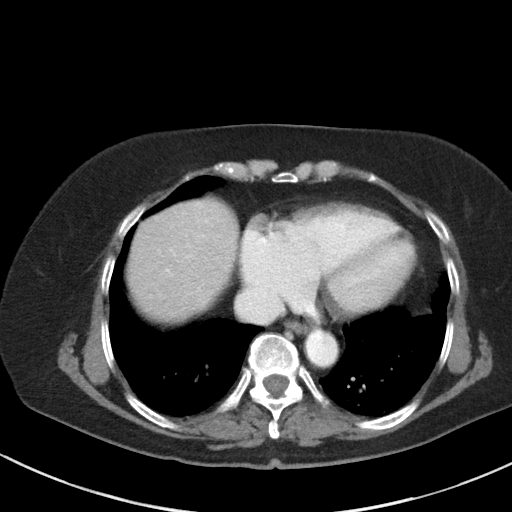

[Series 5: coronal st · coronal · 0.60mm/px · 3 of 74 slices shown]
[im 25/74  soft-tissue]
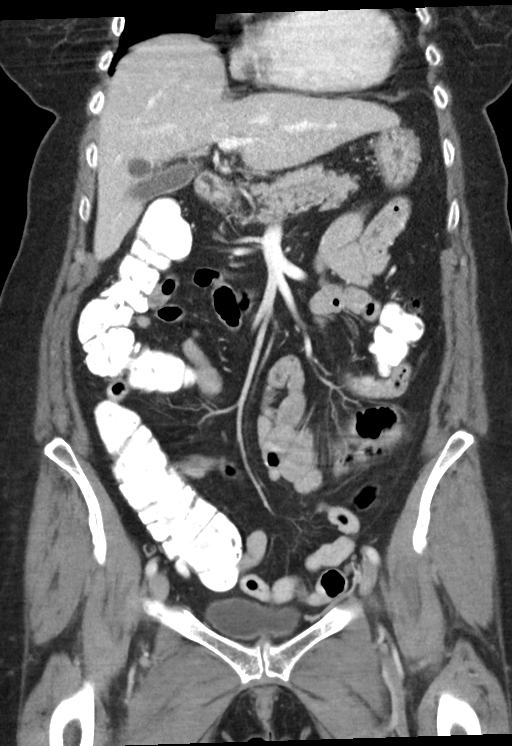
[im 33/74  soft-tissue]
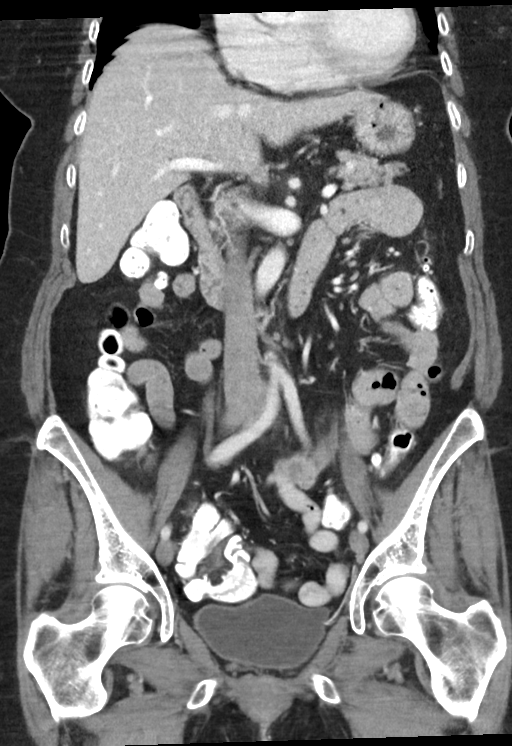
[im 41/74  soft-tissue]
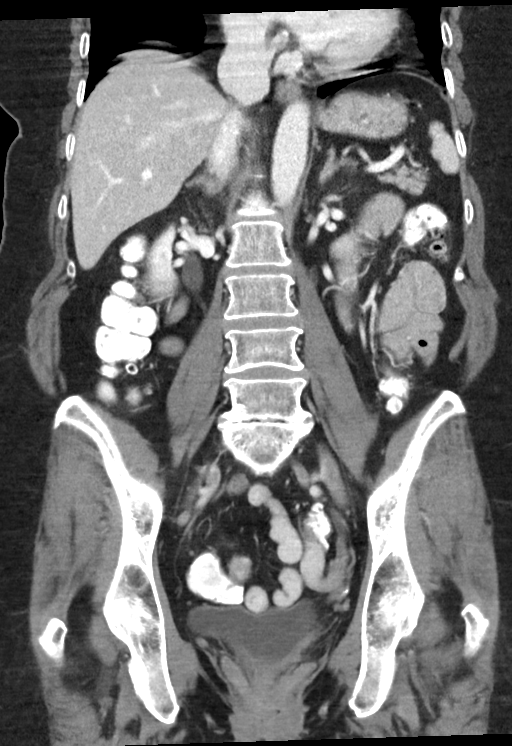

[15 of 46 positions shown; findings below may reference images not displayed]

FINDINGS: Lower chest: Mild dependent atelectatic changes are seen.

Hepatobiliary: Fatty infiltration of the liver is noted. Gallbladder
shows a tiny dependent stone without complicating factors. Cysts are
noted adjacent to the gallbladder fossa.

Pancreas: Unremarkable. No pancreatic ductal dilatation or
surrounding inflammatory changes.

Spleen: Normal in size without focal abnormality.

Adrenals/Urinary Tract: Adrenal glands are within normal limits.
Kidneys demonstrate a normal enhancement pattern bilaterally. Normal
excretion is noted from both kidneys. Small left renal cyst is noted
measuring less than 1 cm. No obstructive changes are seen. The
bladder is well distended.

Stomach/Bowel: Diverticulosis is noted without evidence of
diverticulitis. Herniation of a loop of mid transverse colon is
noted within an anterior abdominal wall hernia. No incarceration is
seen. The appendix is not well visualized. No inflammatory changes
to suggest appendicitis are seen. Small bowel is within normal
limits. A single loop of small bowel extends into the anterior
abdominal wall hernia as well. Again no incarceration is seen. The
stomach is within normal limits.

Vascular/Lymphatic: Aortic atherosclerosis. No enlarged abdominal or
pelvic lymph nodes.

Reproductive: Uterus and bilateral adnexa are unremarkable.

Other: Anterior abdominal wall hernia is noted to the left of the
midline in an area of previous surgical/colostomy wound. A loop of
transverse colon and small bowel are noted within although no
incarceration is seen.

Musculoskeletal: No acute or significant osseous findings.
IMPRESSION: Anterior abdominal wall hernia to the left of the midline containing
loops of small and large bowel. This likely contributes to the
patient's underlying discomfort.

Diverticulosis without diverticulitis.

Fatty liver

## 2020-09-29 MED ORDER — IOHEXOL 300 MG/ML  SOLN
100.0000 mL | Freq: Once | INTRAMUSCULAR | Status: AC | PRN
  Administered 2020-09-29: 100 mL via INTRAVENOUS

## 2020-10-02 ENCOUNTER — Telehealth: Payer: Self-pay | Admitting: Neurology

## 2020-10-02 NOTE — Telephone Encounter (Signed)
Patient left vm wanting a return call from [place] only. She did not specify why. I did speak with patient earlier and gave her CT results.

## 2020-10-02 NOTE — Telephone Encounter (Signed)
Called pt and she stated that she didn't realize that she had to sign up for my chart for [place] for her results.   Pt advised to continue to eat well and exercise.

## 2020-10-07 ENCOUNTER — Ambulatory Visit (INDEPENDENT_AMBULATORY_CARE_PROVIDER_SITE_OTHER): Payer: Medicare Other | Admitting: Family Medicine

## 2020-10-07 ENCOUNTER — Encounter: Payer: Self-pay | Admitting: Family Medicine

## 2020-10-07 ENCOUNTER — Other Ambulatory Visit: Payer: Self-pay

## 2020-10-07 ENCOUNTER — Other Ambulatory Visit: Payer: Self-pay | Admitting: Family Medicine

## 2020-10-07 VITALS — BP 131/83 | HR 83 | Ht 60.0 in | Wt 138.0 lb

## 2020-10-07 DIAGNOSIS — K430 Incisional hernia with obstruction, without gangrene: Secondary | ICD-10-CM | POA: Diagnosis not present

## 2020-10-07 DIAGNOSIS — K8020 Calculus of gallbladder without cholecystitis without obstruction: Secondary | ICD-10-CM | POA: Insufficient documentation

## 2020-10-07 DIAGNOSIS — R1032 Left lower quadrant pain: Secondary | ICD-10-CM

## 2020-10-07 DIAGNOSIS — K760 Fatty (change of) liver, not elsewhere classified: Secondary | ICD-10-CM | POA: Diagnosis not present

## 2020-10-07 NOTE — Patient Instructions (Signed)
Fatty Liver Disease  The liver converts food into energy, removes toxic material from the blood, makes important proteins, and absorbs necessary vitamins from food. Fatty liver disease occurs when too much fat has built up in your liver cells. Fatty liver disease is also called hepatic steatosis. In many cases, fatty liver disease does not cause symptoms or problems. It is often diagnosed when tests are being done for other reasons. However, over time, fatty liver can cause inflammation that may lead to more serious liver problems, such as scarring of the liver (cirrhosis) and liver failure. Fatty liver is associated with insulin resistance, increased body fat, high blood pressure (hypertension), and high cholesterol. These are features of metabolic syndrome and increase your risk for stroke, diabetes, and heart disease. What are the causes? This condition may be caused by components of metabolic syndrome:  Obesity.  Insulin resistance.  High cholesterol. Other causes:  Alcohol abuse.  Poor nutrition.  Cushing syndrome.  Pregnancy.  Certain drugs.  Poisons.  Some viral infections. What increases the risk? You are more likely to develop this condition if you:  Abuse alcohol.  Are overweight.  Have diabetes.  Have hepatitis.  Have a high triglyceride level.  Are pregnant. What are the signs or symptoms? Fatty liver disease often does not cause symptoms. If symptoms do develop, they can include:  Fatigue and weakness.  Weight loss.  Confusion.  Nausea, vomiting, or abdominal pain.  Yellowing of your skin and the white parts of your eyes (jaundice).  Itchy skin. How is this diagnosed? This condition may be diagnosed by:  A physical exam and your medical history.  Blood tests.  Imaging tests, such as an ultrasound, CT scan, or MRI.  A liver biopsy. A small sample of liver tissue is removed using a needle. The sample is then looked at under a  microscope. How is this treated? Fatty liver disease is often caused by other health conditions. Treatment for fatty liver may involve medicines and lifestyle changes to manage conditions such as:  Alcoholism.  High cholesterol.  Diabetes.  Being overweight or obese. Follow these instructions at home:  Do not drink alcohol. If you have trouble quitting, ask your health care provider how to safely quit with the help of medicine or a supervised program. This is important to keep your condition from getting worse.  Eat a healthy diet as told by your health care provider. Ask your health care provider about working with a dietitian to develop an eating plan.  Exercise regularly. This can help you lose weight and control your cholesterol and diabetes. Talk to your health care provider about an exercise plan and which activities are best for you.  Take over-the-counter and prescription medicines only as told by your health care provider.  Keep all follow-up visits. This is important.   Contact a health care provider if:  You have trouble controlling your: ? Blood sugar. This is especially important if you have diabetes. ? Cholesterol. ? Drinking of alcohol. Get help right away if:  You have abdominal pain.  You have jaundice.  You have nausea and are vomiting.  You vomit blood or material that looks like coffee grounds.  You have stools that are black, tar-like, or bloody. Summary  Fatty liver disease develops when too much fat builds up in the cells of your liver.  Fatty liver disease often causes no symptoms or problems. However, over time, fatty liver can cause inflammation that may lead to more serious liver   problems, such as scarring of the liver (cirrhosis).  You are more likely to develop this condition if you abuse alcohol, are pregnant, are overweight, have diabetes, have hepatitis, or have high triglyceride or cholesterol levels.  Contact your health care provider  if you have trouble controlling your blood sugar, cholesterol, or drinking of alcohol. This information is not intended to replace advice given to you by your health care provider. Make sure you discuss any questions you have with your health care provider. Document Revised: 05/15/2020 Document Reviewed: 05/15/2020 Elsevier Patient Education  2021 Elsevier Inc.  

## 2020-10-07 NOTE — Progress Notes (Signed)
Established Patient Office Visit  Subjective:  Patient ID: Valerie Fisher, female    DOB: [birth year]  Age: 70 y.o. MRN: [id number]  CC: No chief complaint on file.   HPI Valerie Fisher presents for go over CT results.  Presented initially with some left lower quadrant pain as well as diarrhea at the time I felt her symptoms were most consistent with either food poisoning gastroenteritis, or irritable bowel syndrome.  She was worried about the possibility of diverticulitis she has had that previously, it perforated and resulted in a colostomy.  She still having pain in that left abdominal wall area no nausea.  She says the diarrhea is better, but not resolved.  Is also concerned about some fullness across her lower abdomen including the right and left side.  CLINICAL DATA:  Left lower quadrant pain  EXAM: CT ABDOMEN AND PELVIS WITH CONTRAST  TECHNIQUE: Multidetector CT imaging of the abdomen and pelvis was performed using the standard protocol following bolus administration of intravenous contrast.  CONTRAST:  125mL OMNIPAQUE IOHEXOL 300 MG/ML  SOLN  COMPARISON:  None.  FINDINGS: Lower chest: Mild dependent atelectatic changes are seen.  Hepatobiliary: Fatty infiltration of the liver is noted. Gallbladder shows a tiny dependent stone without complicating factors. Cysts are noted adjacent to the gallbladder fossa.  Pancreas: Unremarkable. No pancreatic ductal dilatation or surrounding inflammatory changes.  Spleen: Normal in size without focal abnormality.  Adrenals/Urinary Tract: Adrenal glands are within normal limits. Kidneys demonstrate a normal enhancement pattern bilaterally. Normal excretion is noted from both kidneys. Small left renal cyst is noted measuring less than 1 cm. No obstructive changes are seen. The bladder is well distended.  Stomach/Bowel: Diverticulosis is noted without evidence of diverticulitis. Herniation of a loop of mid transverse  colon is noted within an anterior abdominal wall hernia. No incarceration is seen. The appendix is not well visualized. No inflammatory changes to suggest appendicitis are seen. Small bowel is within normal limits. A single loop of small bowel extends into the anterior abdominal wall hernia as well. Again no incarceration is seen. The stomach is within normal limits.  Vascular/Lymphatic: Aortic atherosclerosis. No enlarged abdominal or pelvic lymph nodes.  Reproductive: Uterus and bilateral adnexa are unremarkable.  Other: Anterior abdominal wall hernia is noted to the left of the midline in an area of previous surgical/colostomy wound. A loop of transverse colon and small bowel are noted within although no incarceration is seen.  Musculoskeletal: No acute or significant osseous findings.  IMPRESSION: Anterior abdominal wall hernia to the left of the midline containing loops of small and large bowel. This likely contributes to the patient's underlying discomfort.  Diverticulosis without diverticulitis.  Fatty liver   Electronically Signed   By: Inez Catalina M.D.  Past Medical History:  Diagnosis Date  . History of diverticular abscess of colon 12/12/2017    Past Surgical History:  Procedure Laterality Date  . COLOSTOMY    . COLOSTOMY REVERSAL  02/2019    Family History  Problem Relation Age of Onset  . Diabetes Father   . Heart attack Father        mid 27s    Social History   Socioeconomic History  . Marital status: Married    Spouse name: BJ  . Number of children: 3  . Years of education: Not on file  . Highest education level: Not on file  Occupational History  . Occupation: retired Pharmacist, hospital  Tobacco Use  . Smoking status: Never Smoker  . Smokeless  tobacco: Never Used  Substance and Sexual Activity  . Alcohol use: Yes    Alcohol/week: 0.0 standard drinks    Comment: .5 glass once a month  . Drug use: Never  . Sexual activity: Yes     Partners: Male    Birth control/protection: None  Other Topics Concern  . Not on file  Social History Narrative  . Not on file   Social Determinants of Health   Financial Resource Strain: Not on file  Food Insecurity: Not on file  Transportation Needs: Not on file  Physical Activity: Not on file  Stress: Not on file  Social Connections: Not on file  Intimate Partner Violence: Not on file    Outpatient Medications Prior to Visit  Medication Sig Dispense Refill  . Calcium Carbonate-Vit D-Min (CALCIUM 1200 PO) Take by mouth.    . Cholecalciferol (VITAMIN D) 125 MCG (5000 UT) CAPS Take 1 capsule by mouth daily.    Marland Kitchen dicyclomine (BENTYL) 10 MG capsule Take 1 capsule (10 mg total) by mouth 4 (four) times daily -  before meals and at bedtime. 120 capsule 3  . sertraline (ZOLOFT) 100 MG tablet Take 1 tablet (100 mg total) by mouth daily. 90 tablet 1  . topiramate (TOPAMAX) 100 MG tablet Take 100 mg by mouth 2 (two) times daily. 1 TAB IN THE MORNING AND 1 TAB IN THE EVENING  11   No facility-administered medications prior to visit.    Allergies  Allergen Reactions  . Aspirin Other (See Comments)    Other reaction(s): GI Upset (intolerance), Other GI upset GI upset   . Hydrocodone-Acetaminophen Nausea And Vomiting  . Hydromorphone Hcl Nausea And Vomiting  . Morphine Nausea And Vomiting  . Pseudoephedrine Hcl Nausea And Vomiting    ROS Review of Systems    Objective:    Physical Exam  BP 131/83   Pulse 83   Ht 5' (1.524 m)   Wt 138 lb (62.6 kg)   SpO2 100%   BMI 26.95 kg/m  Wt Readings from Last 3 Encounters:  10/07/20 138 lb (62.6 kg)  09/22/20 141 lb (64 kg)  05/30/20 142 lb 9.6 oz (64.7 kg)     Health Maintenance Due  Topic Date Due  . Hepatitis C Screening  Never done  . COLONOSCOPY (Pts 45-43yrs Insurance coverage will need to be confirmed)  Never done  . COVID-19 Vaccine (3 - Booster for Pfizer series) 05/09/2020    There are no preventive care  reminders to display for this patient.  Lab Results  Component Value Date   TSH 1.650 09/12/2019   Lab Results  Component Value Date   WBC 5.1 09/22/2020   HGB 15.2 09/22/2020   HCT 45.6 09/22/2020   MCV 90 09/22/2020   PLT 327 09/22/2020   Lab Results  Component Value Date   NA 141 09/22/2020   K 4.6 09/22/2020   CO2 21 09/22/2020   GLUCOSE 97 09/22/2020   BUN 11 09/22/2020   CREATININE 0.88 09/22/2020   BILITOT 0.4 09/22/2020   ALKPHOS 58 09/22/2020   AST 12 09/22/2020   ALT 8 09/22/2020   PROT 7.2 09/22/2020   ALBUMIN 4.8 (H) 09/22/2020   CALCIUM 9.7 09/22/2020   Lab Results  Component Value Date   CHOL 246 (H) 09/12/2019   Lab Results  Component Value Date   HDL 88 09/12/2019   Lab Results  Component Value Date   LDLCALC 134 (H) 09/12/2019   Lab Results  Component Value  Date   TRIG 138 09/12/2019   Lab Results  Component Value Date   CHOLHDL 2.8 09/12/2019   No results found for: HGBA1C    Assessment & Plan:   Problem List Items Addressed This Visit      Digestive   Fatty liver - Primary   Calculus of gallbladder without cholecystitis without obstruction    Other Visit Diagnoses    Left lower quadrant abdominal pain       Incisional hernia with obstruction but no gangrene       Relevant Orders   Ambulatory referral to General Surgery     Went over the CT results with her and her husband today.  Noting particularly the fatty liver discussed the importance of healthy diet and regular exercise.  She and her husband both report that she actually eats very healthy and exercises very regularly at least 3 days/week.  So just encouraged her to keep up those good habits.  Otherwise there is no other intervention that would be helpful at this point.  Left lower quadrant pain-noted to have an abdominal hernia in that area.  Which certainly could be causing her pain and discomfort it is near her old incision from her colectomy because she still having  discomfort we discussed options including referral to general surgery for consultation to determine if they feel like her pain is coming from the hernia and whether or not it should be repaired.  We also discussed the possibility of even a GI referral especially if her diarrhea persists.  Calculus of gallbladder-noted on CT scan.  Discussed that it is very small and did not show any sign of obstruction area I do not think this is at all related to her pain or discomfort.  No orders of the defined types were placed in this encounter.   Follow-up: No follow-ups on file.   I spent 32 minutes on the day of the encounter to include pre-visit record review, face-to-face time with the patient and post visit ordering of test.   Beatrice Lecher, MD

## 2020-10-17 ENCOUNTER — Telehealth: Payer: Self-pay

## 2020-10-17 NOTE — Telephone Encounter (Signed)
Valerie Fisher called and states she has a history of Hiatal Hernia. She states she now feels very nauseous and her stomch aches after eating. She believes she is having a flare up of the hiatal hernia. She has been scheduled for next week.  She would like something for the nausea. Please advise.

## 2020-10-20 MED ORDER — ONDANSETRON 4 MG PO TBDP: 4.0000 mg | ORAL_TABLET | Freq: Three times a day (TID) | ORAL | 0 refills | Status: DC | PRN

## 2020-10-20 MED ORDER — LANSOPRAZOLE 30 MG PO CPDR: 30.0000 mg | DELAYED_RELEASE_CAPSULE | Freq: Every day | ORAL | 1 refills | Status: DC

## 2020-10-20 NOTE — Telephone Encounter (Signed)
Prescription sent for reflux medicine as well as nausea medicine and encouraged her to start the reflux medicine daily at least for a month.

## 2020-10-20 NOTE — Telephone Encounter (Signed)
Left VM with results and recommendations.

## 2020-10-24 ENCOUNTER — Encounter: Payer: Self-pay | Admitting: Family Medicine

## 2020-10-24 ENCOUNTER — Other Ambulatory Visit: Payer: Self-pay

## 2020-10-24 ENCOUNTER — Ambulatory Visit (INDEPENDENT_AMBULATORY_CARE_PROVIDER_SITE_OTHER): Payer: Medicare Other

## 2020-10-24 ENCOUNTER — Ambulatory Visit (INDEPENDENT_AMBULATORY_CARE_PROVIDER_SITE_OTHER): Payer: Medicare Other | Admitting: Family Medicine

## 2020-10-24 VITALS — BP 102/62 | HR 69 | Ht 60.0 in | Wt 136.0 lb

## 2020-10-24 DIAGNOSIS — R0602 Shortness of breath: Secondary | ICD-10-CM | POA: Diagnosis not present

## 2020-10-24 DIAGNOSIS — R1013 Epigastric pain: Secondary | ICD-10-CM

## 2020-10-24 DIAGNOSIS — R911 Solitary pulmonary nodule: Secondary | ICD-10-CM | POA: Diagnosis not present

## 2020-10-24 DIAGNOSIS — Z1231 Encounter for screening mammogram for malignant neoplasm of breast: Secondary | ICD-10-CM

## 2020-10-24 IMAGING — DX DG CHEST 2V
2 series · 2 of 2 positions shown · non-contrast
Comparison: None.

CLINICAL DATA: 69-year-old female with a history of shortness of
breath

EXAM:
CHEST - 2 VIEW

[chest pa]
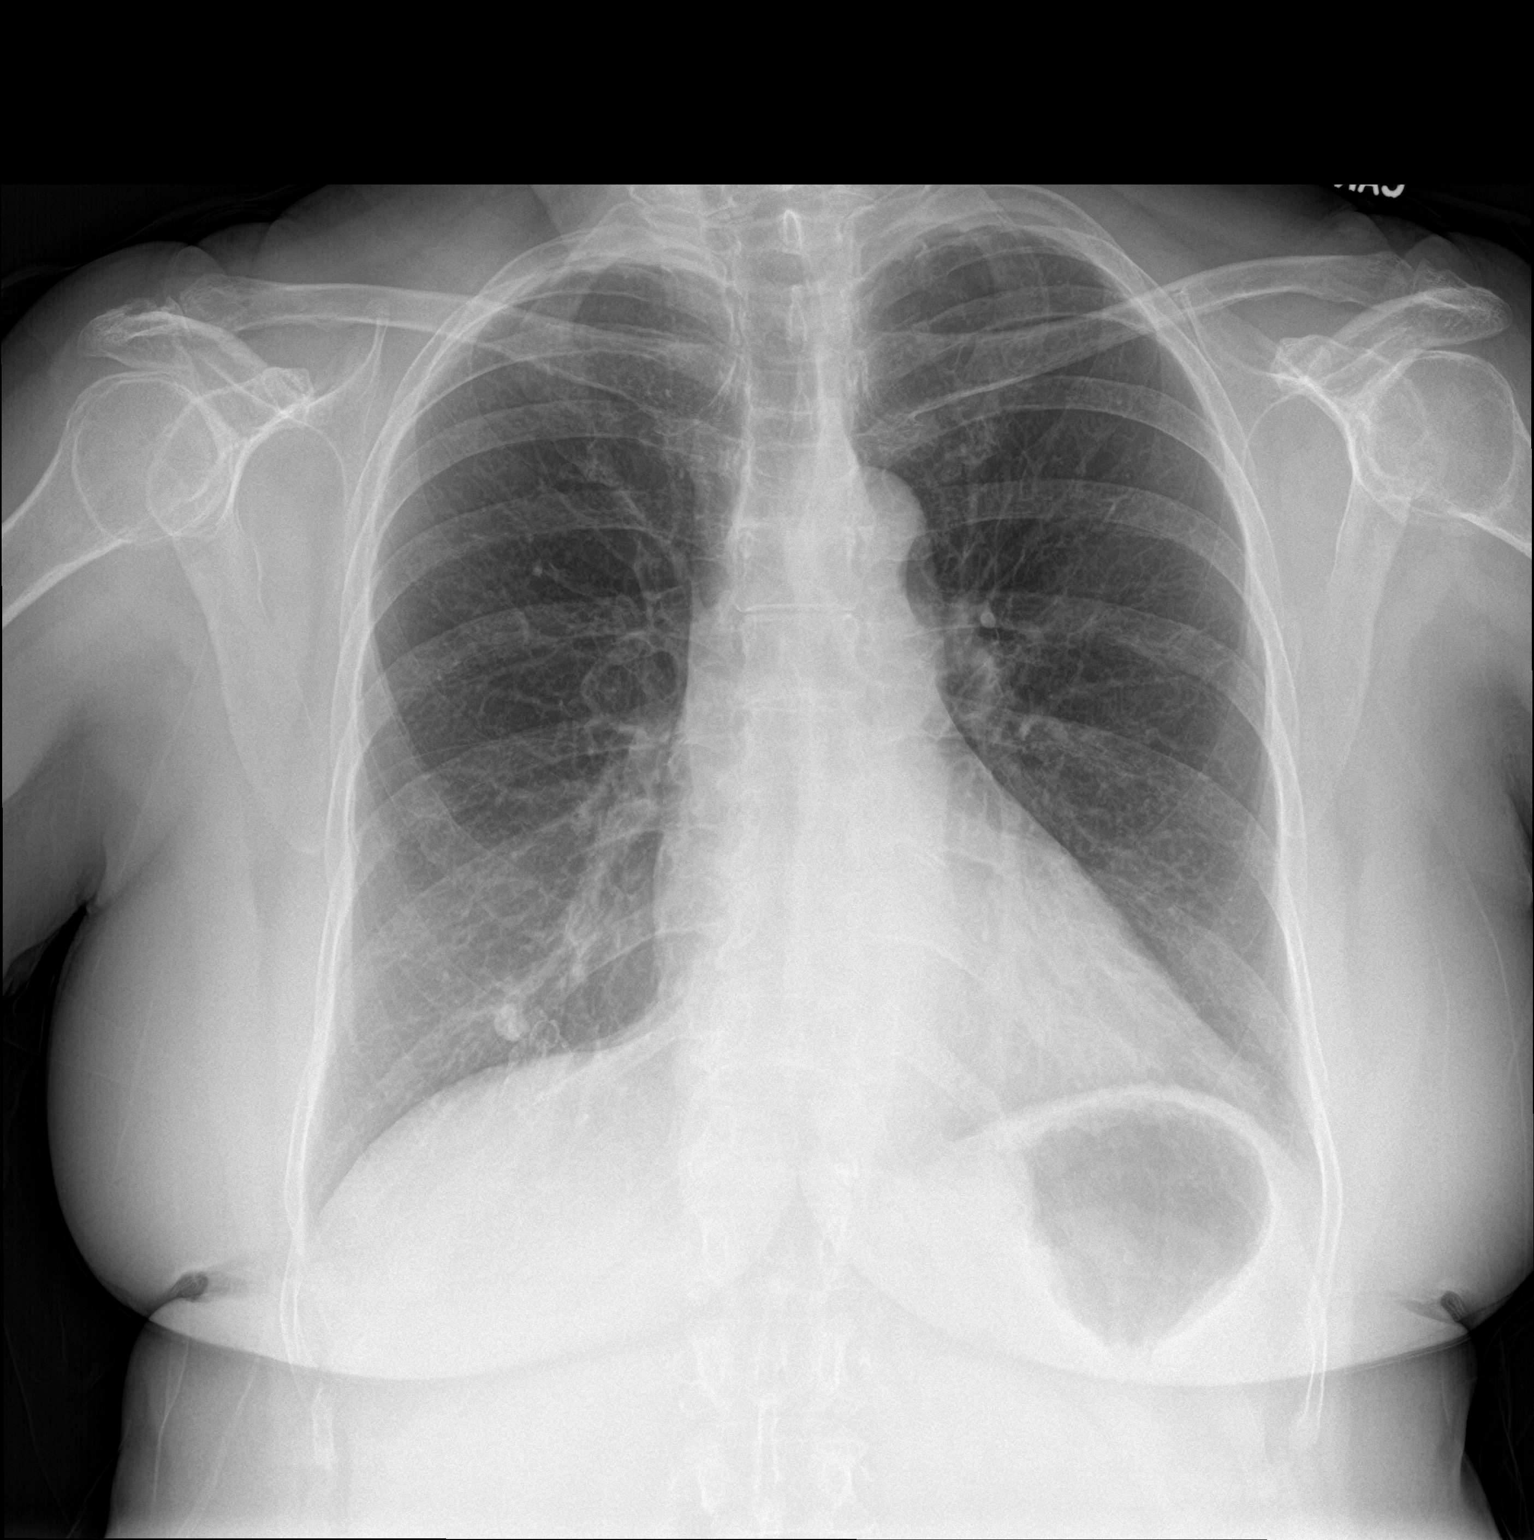

[chest lat]
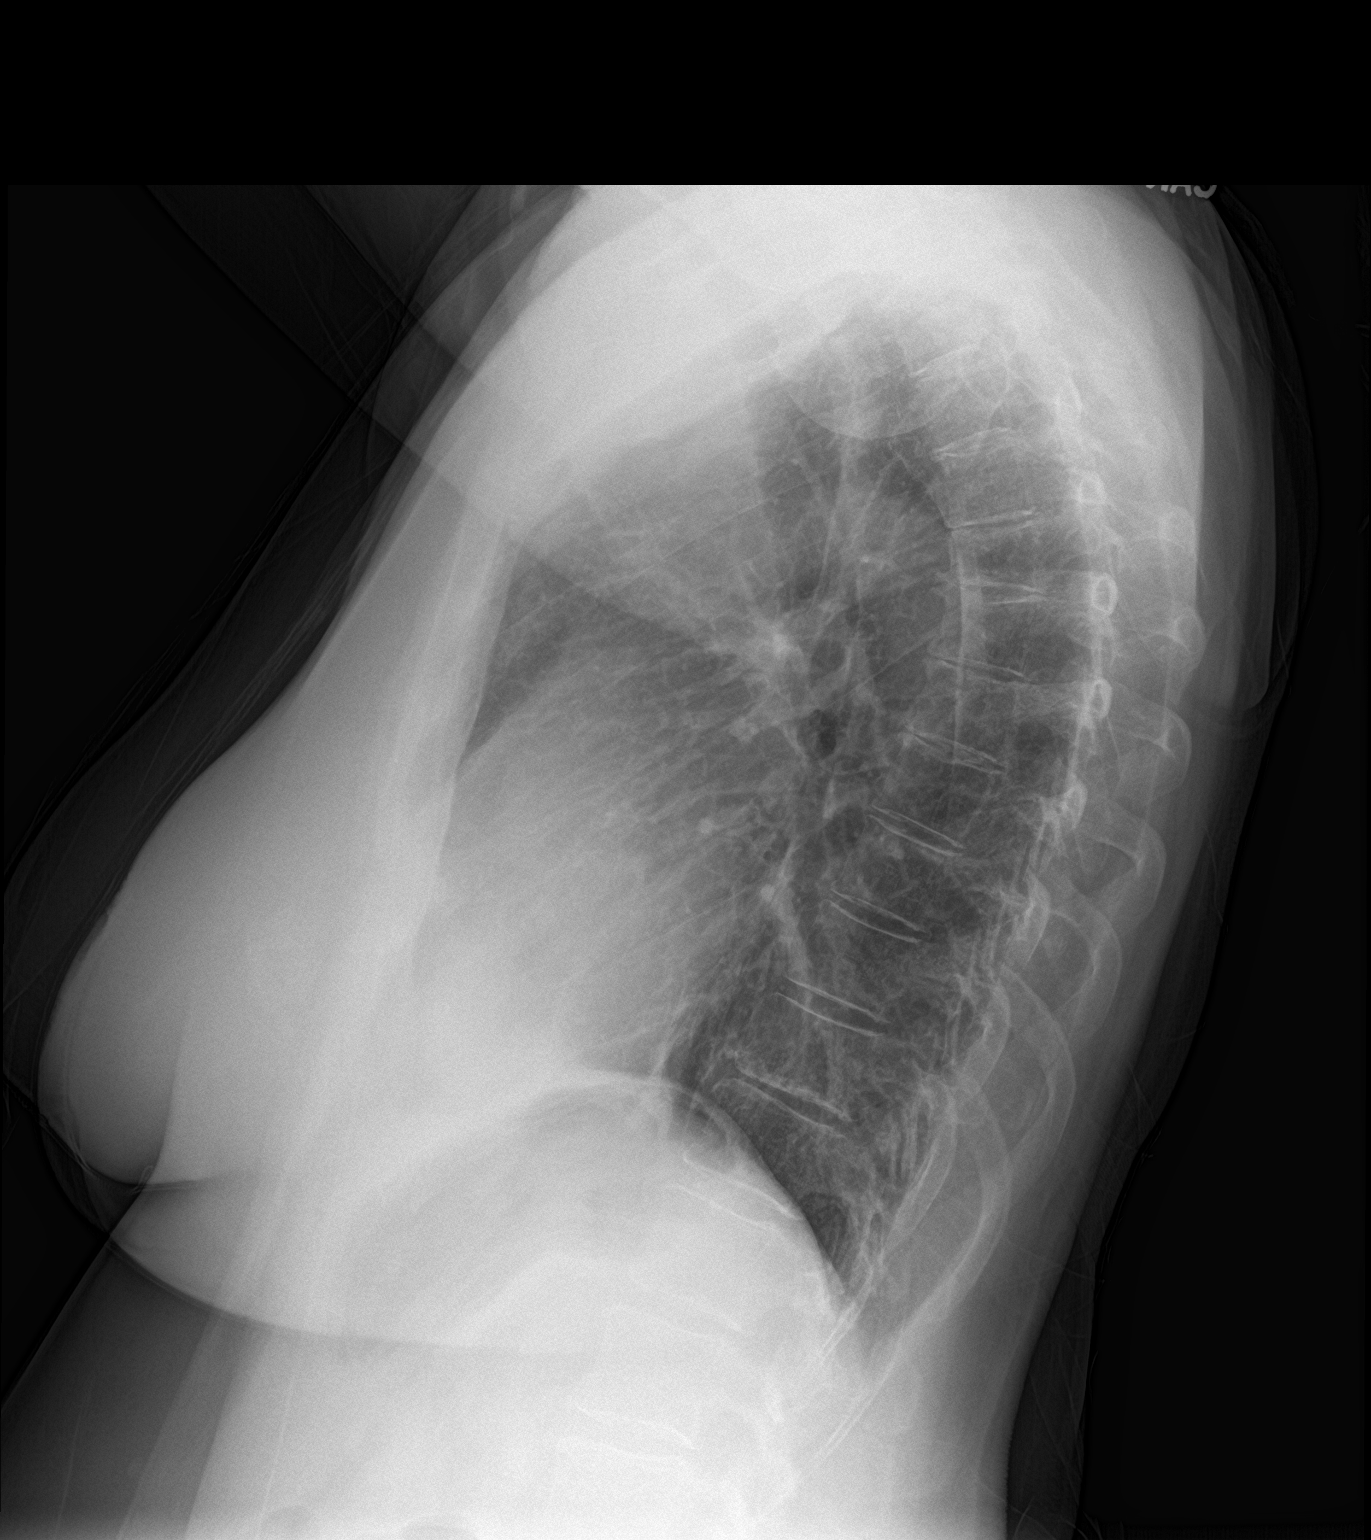

[2 of 2 positions shown; findings below may reference images not displayed]

FINDINGS: Cardiomediastinal silhouette within normal limits. No evidence of
central vascular congestion. No interlobular septal thickening. No
pneumothorax or pleural effusion.

Discrete rounded nodule at the base of the right lung just above the
hemidiaphragm on the frontal view. No comparison available.

No confluent airspace disease.

Degenerative changes of the spine. No evidence of acute displaced
fracture.
IMPRESSION: Negative for acute cardiopulmonary disease.

Partially calcified nodule at the base of the right lung, most
likely partially calcified granuloma or hamartoma, though no
comparison is available.

## 2020-10-24 MED ORDER — SUCRALFATE 1 GM/10ML PO SUSP: 1.0000 g | Freq: Three times a day (TID) | ORAL | 0 refills | Status: DC

## 2020-10-24 MED ORDER — LIDOCAINE VISCOUS HCL 2 % MT SOLN
15.0000 mL | Freq: Once | OROMUCOSAL | Status: AC
  Administered 2020-10-24: 15 mL via OROMUCOSAL

## 2020-10-24 MED ORDER — HYOSCYAMINE SULFATE 0.125 MG PO TBDP
0.1250 mg | ORAL_TABLET | Freq: Once | ORAL | Status: AC
  Administered 2020-10-24: 0.125 mg via SUBLINGUAL

## 2020-10-24 MED ORDER — ALUM & MAG HYDROXIDE-SIMETH 200-200-20 MG/5ML PO SUSP
30.0000 mL | Freq: Once | ORAL | Status: AC
  Administered 2020-10-24: 30 mL via ORAL

## 2020-10-24 NOTE — Progress Notes (Signed)
Acute Office Visit  Subjective:    Patient ID: Valerie Fisher, female    DOB: 01-09-1951, 70 y.o.   MRN: [id number]  Chief Complaint  Patient presents with  . Abdominal Pain  . Nausea    HPI Patient is in today for epigastric pain.  She says it started a about a week ago.  It seems to be worse with eating she says it will start while she is actually eating and then continue for a couple hours before it starts to ease off.  She does feel little nauseated with it but has not vomited she said she was told years ago that she had a hiatal hernia.  She did start the Prevacid that we called in for her about 3 days ago today was her third dose which she took around noon today.  She says that her stools are moving pretty regularly she does not feel constipated.  She did take the Zofran that I sent in but says that it actually gave her a pounding headache so has not taken it again.  She also reports feeling short of breath for the last couple of weeks.  She says it started a little bit before I saw her at the last visit on February 22.  She is noticing it more with going up and down stairs and with exercise.  She denies any chest pain.  Or excess noises.  No fevers or chills.  Past Medical History:  Diagnosis Date  . History of diverticular abscess of colon 12/12/2017    Past Surgical History:  Procedure Laterality Date  . COLOSTOMY    . COLOSTOMY REVERSAL  02/2019    Family History  Problem Relation Age of Onset  . Diabetes Father   . Heart attack Father        mid 3s    Social History   Socioeconomic History  . Marital status: Married    Spouse name: BJ  . Number of children: 3  . Years of education: Not on file  . Highest education level: Not on file  Occupational History  . Occupation: retired Pharmacist, hospital  Tobacco Use  . Smoking status: Never Smoker  . Smokeless tobacco: Never Used  Substance and Sexual Activity  . Alcohol use: Yes    Alcohol/week: 0.0 standard drinks     Comment: .5 glass once a month  . Drug use: Never  . Sexual activity: Yes    Partners: Male    Birth control/protection: None  Other Topics Concern  . Not on file  Social History Narrative  . Not on file   Social Determinants of Health   Financial Resource Strain: Not on file  Food Insecurity: Not on file  Transportation Needs: Not on file  Physical Activity: Not on file  Stress: Not on file  Social Connections: Not on file  Intimate Partner Violence: Not on file    Outpatient Medications Prior to Visit  Medication Sig Dispense Refill  . Calcium Carbonate-Vit D-Min (CALCIUM 1200 PO) Take by mouth.    . Cholecalciferol (VITAMIN D) 125 MCG (5000 UT) CAPS Take 1 capsule by mouth daily.    Marland Kitchen dicyclomine (BENTYL) 10 MG capsule Take 1 capsule (10 mg total) by mouth 4 (four) times daily -  before meals and at bedtime. 120 capsule 3  . lansoprazole (PREVACID) 30 MG capsule Take 1 capsule (30 mg total) by mouth daily at 12 noon. 30 capsule 1  . levETIRAcetam (KEPPRA) 500 MG tablet Take 1,500 mg  by mouth 2 (two) times daily.  3  . sertraline (ZOLOFT) 100 MG tablet Take 1 tablet (100 mg total) by mouth daily. 90 tablet 1  . topiramate (TOPAMAX) 100 MG tablet Take 100 mg by mouth 2 (two) times daily. 1 TAB IN THE MORNING AND 1 TAB IN THE EVENING  11  . zonisamide (ZONEGRAN) 100 MG capsule Take 1 capsule by mouth AT BEDTIME for 2 weeks then increase to 2 capsules BY MOUTH AT BEDTIME.  1  . ondansetron (ZOFRAN ODT) 4 MG disintegrating tablet Take 1 tablet (4 mg total) by mouth every 8 (eight) hours as needed for nausea or vomiting. 20 tablet 0   No facility-administered medications prior to visit.    Allergies  Allergen Reactions  . Aspirin Other (See Comments)    Other reaction(s): GI Upset (intolerance), Other GI upset GI upset   . Hydrocodone-Acetaminophen Nausea And Vomiting  . Hydromorphone Hcl Nausea And Vomiting  . Morphine Nausea And Vomiting  . Pseudoephedrine Hcl Nausea And  Vomiting  . Zofran [Ondansetron] Other (See Comments)    Headache    Review of Systems     Objective:    Physical Exam Constitutional:      Appearance: She is well-developed.  HENT:     Head: Normocephalic and atraumatic.  Cardiovascular:     Rate and Rhythm: Normal rate and regular rhythm.     Heart sounds: Normal heart sounds.  Pulmonary:     Effort: Pulmonary effort is normal.     Breath sounds: Normal breath sounds.  Abdominal:     General: Abdomen is flat. Bowel sounds are normal.     Palpations: Abdomen is soft.     Tenderness: There is abdominal tenderness.     Comments: Mild tenderness in the right lower quadrant and left lower quadrant.  Skin:    General: Skin is warm and dry.  Neurological:     Mental Status: She is alert and oriented to person, place, and time.  Psychiatric:        Behavior: Behavior normal.     BP 102/62   Pulse 69   Ht 5' (1.524 m)   Wt 136 lb (61.7 kg)   SpO2 98%   BMI 26.56 kg/m  Wt Readings from Last 3 Encounters:  10/24/20 136 lb (61.7 kg)  10/07/20 138 lb (62.6 kg)  09/22/20 141 lb (64 kg)    Health Maintenance Due  Topic Date Due  . Hepatitis C Screening  Never done  . COLONOSCOPY (Pts 45-27yrs Insurance coverage will need to be confirmed)  Never done  . COVID-19 Vaccine (3 - Booster for Pfizer series) 05/09/2020    There are no preventive care reminders to display for this patient.   Lab Results  Component Value Date   TSH 1.650 09/12/2019   Lab Results  Component Value Date   WBC 5.1 09/22/2020   HGB 15.2 09/22/2020   HCT 45.6 09/22/2020   MCV 90 09/22/2020   PLT 327 09/22/2020   Lab Results  Component Value Date   NA 141 09/22/2020   K 4.6 09/22/2020   CO2 21 09/22/2020   GLUCOSE 97 09/22/2020   BUN 11 09/22/2020   CREATININE 0.88 09/22/2020   BILITOT 0.4 09/22/2020   ALKPHOS 58 09/22/2020   AST 12 09/22/2020   ALT 8 09/22/2020   PROT 7.2 09/22/2020   ALBUMIN 4.8 (H) 09/22/2020   CALCIUM 9.7  09/22/2020   Lab Results  Component Value Date  CHOL 246 (H) 09/12/2019   Lab Results  Component Value Date   HDL 88 09/12/2019   Lab Results  Component Value Date   LDLCALC 134 (H) 09/12/2019   Lab Results  Component Value Date   TRIG 138 09/12/2019   Lab Results  Component Value Date   CHOLHDL 2.8 09/12/2019   No results found for: HGBA1C     Assessment & Plan:   Problem List Items Addressed This Visit   None   Visit Diagnoses    Epigastric pain    -  Primary   Relevant Medications   alum & mag hydroxide-simeth (MAALOX/MYLANTA) 200-200-20 MG/5ML suspension 30 mL (Completed)   lidocaine (XYLOCAINE) 2 % viscous mouth solution 15 mL (Completed)   hyoscyamine (ANASPAZ) disintergrating tablet 0.125 mg (Completed)   sucralfate (CARAFATE) 1 GM/10ML suspension   Other Relevant Orders   Ambulatory referral to Gastroenterology   SOB (shortness of breath)       Relevant Orders   DG Chest 2 View   Screening mammogram for breast cancer       Relevant Orders   MM 3D SCREEN BREAST BILATERAL     Shortness of breath-unclear etiology does not sound like she has had any cold or upper respiratory symptoms.  No chest pain to indicate underlying cardiac disease.  Recommend starting with a chest x-ray for further work-up.  Epigastric pain-it sounds most consistent with gastritis or gastric ulcers.  It does not sound quite consistent with cholecystitis.  We did give her a GI cocktail here in the office and she did get significant relief after sitting for 10 or 15 minutes after the GI cocktail this makes me feel more confident that this is related to her stomach.  Organ to add Carafate before meals and at bedtime and continue with the PPI that she just started 3 days ago hopefully she will continue to improve over the next week or 2 if not then recommend GI referral.  Meds ordered this encounter  Medications  . alum & mag hydroxide-simeth (MAALOX/MYLANTA) 200-200-20 MG/5ML suspension  30 mL  . lidocaine (XYLOCAINE) 2 % viscous mouth solution 15 mL  . hyoscyamine (ANASPAZ) disintergrating tablet 0.125 mg  . sucralfate (CARAFATE) 1 GM/10ML suspension    Sig: Take 10 mLs (1 g total) by mouth 4 (four) times daily -  with meals and at bedtime.    Dispense:  420 mL    Refill:  0     Beatrice Lecher, MD

## 2020-10-24 NOTE — Patient Instructions (Signed)
Please give [place] a call back next week and let us know if your stomach is feeling better.  We will call you with results of the chest x-ray once we get those back.

## 2020-10-27 ENCOUNTER — Telehealth: Payer: Self-pay | Admitting: Family Medicine

## 2020-10-27 MED ORDER — SUCRALFATE 1 G PO TABS: 1.0000 g | ORAL_TABLET | Freq: Three times a day (TID) | ORAL | 1 refills | Status: DC

## 2020-10-27 NOTE — Telephone Encounter (Signed)
Valerie Fisher with [facility] called and states that we called in CaraFate suspension but pts insurance does not cover this and he needs to talk to someone about changing to the 1 Gram Tablet [phone]

## 2020-10-27 NOTE — Telephone Encounter (Signed)
OK pill version sent to pharmacy

## 2020-10-28 NOTE — Telephone Encounter (Signed)
Called pt and lvm advising of change.

## 2020-10-31 ENCOUNTER — Encounter: Payer: Self-pay | Admitting: Family Medicine

## 2020-10-31 ENCOUNTER — Telehealth: Payer: Self-pay | Admitting: Family Medicine

## 2020-10-31 NOTE — Telephone Encounter (Signed)
Pt states she would like someone to call her and explain her recent imaging results and that she is concerned and would like someone to call back at [phone]

## 2020-11-07 ENCOUNTER — Other Ambulatory Visit: Payer: Self-pay | Admitting: Surgery

## 2020-11-07 DIAGNOSIS — K432 Incisional hernia without obstruction or gangrene: Secondary | ICD-10-CM | POA: Diagnosis not present

## 2020-11-10 ENCOUNTER — Ambulatory Visit (INDEPENDENT_AMBULATORY_CARE_PROVIDER_SITE_OTHER): Payer: Medicare Other | Admitting: Sports Medicine

## 2020-11-10 ENCOUNTER — Other Ambulatory Visit: Payer: Self-pay

## 2020-11-10 VITALS — Ht 60.0 in | Wt 134.0 lb

## 2020-11-10 DIAGNOSIS — Z0000 Encounter for general adult medical examination without abnormal findings: Secondary | ICD-10-CM

## 2020-11-10 NOTE — Progress Notes (Signed)
MEDICARE ANNUAL WELLNESS VISIT  11/10/2020  Telephone Visit Disclaimer This Medicare AWV was conducted by telephone due to national recommendations for restrictions regarding the COVID-19 Pandemic (e.g. social distancing).  I verified, using two identifiers, that I am speaking with Valerie Fisher or their authorized healthcare agent. I discussed the limitations, risks, security, and privacy concerns of performing an evaluation and management service by telephone and the potential availability of an in-person appointment in the future. The patient expressed understanding and agreed to proceed.  Location of Patient: Home Location of Provider (nurse):  In the office.  Subjective:    Valerie Fisher is a 70 y.o. female patient of Metheney, Rene Kocher, MD who had a Medicare Annual Wellness Visit today via telephone. Valerie Fisher is Retired and lives with their spouse. she has 3 children. she reports that she is socially active and does interact with friends/family regularly. she is moderately physically active and enjoys cooking and exercising.  Patient Care Team: Hali Marry, MD as PCP - General (Family Medicine)  Advanced Directives 11/10/2020 03/05/2019  Does Patient Have a Medical Advance Directive? Yes No  Type of Paramedic of [place];Living will -  Does patient want to make changes to medical advance directive? No - Patient declined -  Copy of [place] in Chart? No - copy requested -  Would patient like information on creating a medical advance directive? - Yes (MAU/Ambulatory/Procedural Areas - Information given)    Hospital Utilization Over the Past 12 Months: # of hospitalizations or ER visits: 0 # of surgeries: 0  Review of Systems    Patient reports that her overall health is worse compared to last year.  History obtained from chart review and the patient  Patient Reported Readings (BP, Pulse, CBG, Weight, etc) Weight 134  lbs  Pain Assessment Pain : 0-10 Pain Score: 6  Pain Type: Acute pain Pain Location: Abdomen (hernia) Pain Orientation: Left Pain Radiating Towards: legs Pain Descriptors / Indicators: Aching Pain Onset: More than a month ago Pain Frequency: Intermittent Pain Relieving Factors: rest  Pain Relieving Factors: rest  Current Medications & Allergies (verified) Allergies as of 11/10/2020      Reactions   Aspirin Other (See Comments)   Other reaction(s): GI Upset (intolerance), Other GI upset GI upset   Hydrocodone-acetaminophen Nausea And Vomiting   Hydromorphone Hcl Nausea And Vomiting   Morphine Nausea And Vomiting   Pseudoephedrine Hcl Nausea And Vomiting   Zofran [ondansetron] Other (See Comments)   Headache      Medication List       Accurate as of November 10, 2020  2:56 PM. If you have any questions, ask your nurse or doctor.        CALCIUM 1200 PO Take by mouth.   dicyclomine 10 MG capsule Commonly known as: Bentyl Take 1 capsule (10 mg total) by mouth 4 (four) times daily -  before meals and at bedtime.   lansoprazole 30 MG capsule Commonly known as: Prevacid Take 1 capsule (30 mg total) by mouth daily at 12 noon.   levETIRAcetam 500 MG tablet Commonly known as: KEPPRA Take 1,500 mg by mouth 2 (two) times daily.   Prolia 60 MG/ML Sosy injection Generic drug: denosumab Inject into the skin.   sertraline 100 MG tablet Commonly known as: ZOLOFT Take 1 tablet (100 mg total) by mouth daily.   sucralfate 1 g tablet Commonly known as: Carafate Take 1 tablet (1 g total) by mouth 4 (four)  times daily -  with meals and at bedtime.   topiramate 100 MG tablet Commonly known as: TOPAMAX Take 100 mg by mouth 2 (two) times daily. 1 TAB IN THE MORNING AND 1 TAB IN THE EVENING   Vitamin D 125 MCG (5000 UT) Caps Take 1 capsule by mouth daily.   zonisamide 100 MG capsule Commonly known as: ZONEGRAN Take 1 capsule by mouth AT BEDTIME for 2 weeks then increase to 2  capsules BY MOUTH AT BEDTIME.       History (reviewed): Past Medical History:  Diagnosis Date  . History of diverticular abscess of colon 12/12/2017   Past Surgical History:  Procedure Laterality Date  . COLOSTOMY    . COLOSTOMY REVERSAL  02/2019   Family History  Problem Relation Age of Onset  . Diabetes Father   . Heart attack Father        mid 28s   Social History   Socioeconomic History  . Marital status: Married    Spouse name: BJ  . Number of children: 3  . Years of education: 79  . Highest education level: Master's degree (e.g., MA, MS, MEng, MEd, MSW, MBA)  Occupational History  . Occupation: retired Pharmacist, hospital  Tobacco Use  . Smoking status: Never Smoker  . Smokeless tobacco: Never Used  Substance and Sexual Activity  . Alcohol use: Not Currently    Alcohol/week: 0.0 standard drinks  . Drug use: Never  . Sexual activity: Yes    Partners: Male    Birth control/protection: None  Other Topics Concern  . Not on file  Social History Narrative   Lives with her husband. They have three children. They have one dog. In her free time, she enjoys cooking and exercising.    Social Determinants of Health   Financial Resource Strain: Low Risk   . Difficulty of Paying Living Expenses: Not hard at all  Food Insecurity: No Food Insecurity  . Worried About Charity fundraiser in the Last Year: Never true  . Ran Out of Food in the Last Year: Never true  Transportation Needs: No Transportation Needs  . Lack of Transportation (Medical): No  . Lack of Transportation (Non-Medical): No  Physical Activity: Sufficiently Active  . Days of Exercise per Week: 5 days  . Minutes of Exercise per Session: 30 min  Stress: No Stress Concern Present  . Feeling of Stress : Not at all  Social Connections: Moderately Isolated  . Frequency of Communication with Friends and Family: More than three times a week  . Frequency of Social Gatherings with Friends and Family: Once a week  .  Attends Religious Services: Never  . Active Member of Clubs or Organizations: No  . Attends Archivist Meetings: Never  . Marital Status: Married    Activities of Daily Living In your present state of health, do you have any difficulty performing the following activities: 11/10/2020  Hearing? N  Vision? N  Difficulty concentrating or making decisions? N  Walking or climbing stairs? N  Dressing or bathing? N  Doing errands, shopping? N  Preparing Food and eating ? N  Using the Toilet? N  In the past six months, have you accidently leaked urine? N  Do you have problems with loss of bowel control? N  Managing your Medications? N  Managing your Finances? N  Housekeeping or managing your Housekeeping? N  Some recent data might be hidden    Patient Education/ Literacy How often do you need to  have someone help you when you read instructions, pamphlets, or other written materials from your doctor or pharmacy?: 1 - Never What is the last grade level you completed in school?: Post Graduate  Exercise Current Exercise Habits: Home exercise routine, Type of exercise: treadmill;stretching, Time (Minutes): 30, Frequency (Times/Week): 5, Weekly Exercise (Minutes/Week): 150, Intensity: Moderate, Exercise limited by: None identified  Diet Patient reports consuming 2 meals a day and 1 snack(s) a day Patient reports that her primary diet is: Regular Patient reports that she does have regular access to food.   Depression Screen PHQ 2/9 Scores 11/10/2020 09/11/2019 06/11/2019 05/07/2019 03/05/2019  PHQ - 2 Score 0 0 0 0 -  Exception Documentation - - - - Other- indicate reason in comment box  Not completed - - - - pt declined to do screening due to having surgery recently     Fall Risk Fall Risk  11/10/2020 03/05/2019  Falls in the past year? 0 0  Number falls in past yr: 0 -  Injury with Fall? 0 -  Risk for fall due to : No Fall Risks -  Follow up Falls evaluation completed -      Objective:  Valerie Fisher seemed alert and oriented and she participated appropriately during our telephone visit.  Blood Pressure Weight BMI  BP Readings from Last 3 Encounters:  10/24/20 102/62  10/07/20 131/83  09/22/20 132/89   Wt Readings from Last 3 Encounters:  11/10/20 134 lb (60.8 kg)  10/24/20 136 lb (61.7 kg)  10/07/20 138 lb (62.6 kg)   BMI Readings from Last 1 Encounters:  11/10/20 26.17 kg/m    *Unable to obtain current vital signs, weight, and BMI due to telephone visit type  Hearing/Vision  . Valerie Fisher did not seem to have difficulty with hearing/understanding during the telephone conversation . Reports that she has had a formal eye exam by an eye care professional within the past year . Reports that she has not had a formal hearing evaluation within the past year *Unable to fully assess hearing and vision during telephone visit type  Cognitive Function: 6CIT Screen 11/10/2020  What Year? 0 points  What month? 0 points  What time? 0 points  Count back from 20 2 points  Months in reverse 0 points  Repeat phrase 2 points  Total Score 4   (Normal:0-7, Significant for Dysfunction: >8)  Normal Cognitive Function Screening: Yes   Immunization & Health Maintenance Record Immunization History  Administered Date(s) Administered  . Fluad Quad(high Dose 65+) 05/02/2019, 05/30/2020  . Influenza, High Dose Seasonal PF 06/03/2018  . Influenza, Seasonal, Injecte, Preservative Fre 05/17/2015  . PFIZER(Purple Top)SARS-COV-2 Vaccination 09/16/2019, 10/14/2019, 11/07/2019  . Pneumococcal Conjugate-13 08/26/2014  . Pneumococcal Polysaccharide-23 09/08/2015  . Tdap 07/26/2008, 08/04/2018  . Zoster 08/15/2014    Health Maintenance  Topic Date Due  . COVID-19 Vaccine (3 - Booster for Pfizer series) 11/26/2020 (Originally 05/09/2020)  . COLONOSCOPY (Pts 45-32yrs Insurance coverage will need to be confirmed)  11/10/2021 (Originally 11/03/1993)  . Hepatitis C Screening   11/10/2021 (Originally Jul 02, 1951)  . MAMMOGRAM  08/20/2021  . TETANUS/TDAP  08/04/2028  . INFLUENZA VACCINE  Completed  . DEXA SCAN  Completed  . PNA vac Low Risk Adult  Completed  . HPV VACCINES  Aged Out       Assessment  This is a routine wellness examination for Valerie Fisher.  Health Maintenance: Due or Overdue There are no preventive care reminders to display for this patient.  Valerie Fisher does  not need a referral for Community Assistance: Care Management:   no Social Work:    no Prescription Assistance:  no Nutrition/Diabetes Education:  no   Plan:  Personalized Goals Goals Addressed              This Visit's Progress   .  Patient Stated (pt-stated)        11/10/2020 AWV Goal: Improved Nutrition/Diet  . Patient will verbalize understanding that diet plays an important role in overall health and that a poor diet is a risk factor for many chronic medical conditions.  . Over the next year, patient will improve self management of their diet by incorporating more water. . Patient will utilize available community resources to help with food acquisition if needed (ex: food pantries, [address], etc) . Patient will work with nutrition specialist if a referral was made       Personalized Health Maintenance & Screening Recommendations  Screening mammography Colorectal cancer screening Shingles vaccine - Zoster was done in 2015.  Lung Cancer Screening Recommended: no (Low Dose CT Chest recommended if Age 22-80 years, 30 pack-year currently smoking OR have quit w/in past 15 years) Hepatitis C Screening recommended: yes HIV Screening recommended: yes  Advanced Directives: Written information was not prepared per patient's request.  Referrals & Orders No orders of the defined types were placed in this encounter.   Follow-up Plan . Follow-up with Hali Marry, MD as planned . Schedule your colonoscopy after your hernia surgery. Please have your previous  colonoscopy records sent to [place].  . Schedule your appointment at your pharmacy for your shingles vaccine.  . Mammogram referral was sent on 10/24/20 to [facility].  . Medicare wellness in one year.   I have personally reviewed and noted the following in the patient's chart:   . Medical and social history . Use of alcohol, tobacco or illicit drugs  . Current medications and supplements . Functional ability and status . Nutritional status . Physical activity . Advanced directives . List of other physicians . Hospitalizations, surgeries, and ER visits in previous 12 months . Vitals . Screenings to include cognitive, depression, and falls . Referrals and appointments  In addition, I have reviewed and discussed with [facility] certain preventive protocols, quality metrics, and best practice recommendations. A written personalized care plan for preventive services as well as general preventive health recommendations is available and can be mailed to the patient at her request.      Valerie Gens, RN  11/10/2020

## 2020-11-10 NOTE — Patient Instructions (Addendum)
[facility] Maintenance Summary and Written Plan of Care  Valerie Fisher ,  Thank you for allowing me to perform your Medicare Annual Wellness Visit and for your ongoing commitment to your health.   Health Maintenance & Immunization History Health Maintenance  Topic Date Due  . COVID-19 Vaccine (3 - Booster for Pfizer series) 11/26/2020 (Originally 05/09/2020)  . COLONOSCOPY (Pts 45-82yrs Insurance coverage will need to be confirmed)  11/10/2021 (Originally 11/03/1993)  . Hepatitis C Screening  11/10/2021 (Originally September 06, 1950)  . MAMMOGRAM  08/20/2021  . TETANUS/TDAP  08/04/2028  . INFLUENZA VACCINE  Completed  . DEXA SCAN  Completed  . PNA vac Low Risk Adult  Completed  . HPV VACCINES  Aged Out   Immunization History  Administered Date(s) Administered  . Fluad Quad(high Dose 65+) 05/02/2019, 05/30/2020  . Influenza, High Dose Seasonal PF 06/03/2018  . Influenza, Seasonal, Injecte, Preservative Fre 05/17/2015  . PFIZER(Purple Top)SARS-COV-2 Vaccination 09/16/2019, 10/14/2019, 11/07/2019  . Pneumococcal Conjugate-13 08/26/2014  . Pneumococcal Polysaccharide-23 09/08/2015  . Tdap 07/26/2008, 08/04/2018  . Zoster 08/15/2014    These are the patient goals that we discussed: Goals Addressed              This Visit's Progress   .  Patient Stated (pt-stated)        11/10/2020 AWV Goal: Improved Nutrition/Diet  . Patient will verbalize understanding that diet plays an important role in overall health and that a poor diet is a risk factor for many chronic medical conditions.  . Over the next year, patient will improve self management of their diet by incorporating more water. . Patient will utilize available community resources to help with food acquisition if needed (ex: food pantries, [address], etc) . Patient will work with nutrition specialist if a referral was made         This is a list of Health Maintenance Items that are overdue or due  now: Screening mammography Colorectal cancer screening Shingles vaccine - Zoster was done in 2015.  Orders/Referrals Placed Today: No orders of the defined types were placed in this encounter.  (Contact our referral department at [phone] if you have not spoken with someone about your referral appointment within the next 5 days)    Follow-up Plan . Follow-up with Hali Marry, MD as planned . Schedule your colonoscopy after your hernia surgery. Please have your previous colonoscopy records sent to [place].  . Schedule your appointment at your pharmacy for your shingles vaccine.  . Mammogram referral was sent on 10/24/20 to [facility].  . Medicare wellness in one year.       Colonoscopy, Adult A colonoscopy is a procedure to look at the entire large intestine. This procedure is done using a long, thin, flexible tube that has a camera on the end. You may have a colonoscopy:  As a part of normal colorectal screening.  If you have certain symptoms, such as: ? A low number of red blood cells in your blood (anemia). ? Diarrhea that does not go away. ? Pain in your abdomen. ? Blood in your stool. A colonoscopy can help screen for and diagnose medical problems, including:  Tumors.  Extra tissue that grows where mucus forms (polyps).  Inflammation.  Areas of bleeding. Tell your health care provider about:  Any allergies you have.  All medicines you are taking, including vitamins, herbs, eye drops, creams, and over-the-counter medicines.  Any problems you or family members have had with  anesthetic medicines.  Any blood disorders you have.  Any surgeries you have had.  Any medical conditions you have.  Any problems you have had with having bowel movements.  Whether you are pregnant or may be pregnant. What are the risks? Generally, this is a safe procedure. However, problems may occur, including:  Bleeding.  Damage to your  intestine.  Allergic reactions to medicines given during the procedure.  Infection. This is rare. What happens before the procedure? Eating and drinking restrictions Follow instructions from your health care provider about eating or drinking restrictions, which may include:  A few days before the procedure: ? Follow a low-fiber diet. ? Avoid nuts, seeds, dried fruit, raw fruits, and vegetables.  1-3 days before the procedure: ? Eat only gelatin dessert or ice pops. ? Drink only clear liquids, such as water, clear juice, clear broth or bouillon, black coffee or tea, or clear soft drinks or sports drinks. ? Avoid liquids that contain red or purple dye.  The day of the procedure: ? Do not eat solid foods. You may continue to drink clear liquids until up to 2 hours before the procedure. ? Do not eat or drink anything starting 2 hours before the procedure, or within the time period that your health care provider recommends. Bowel prep If you were prescribed a bowel prep to take by mouth (orally) to clean out your colon:  Take it as told by your health care provider. Starting the day before your procedure, you will need to drink a large amount of liquid medicine. The liquid will cause you to have many bowel movements of loose stool until your stool becomes almost clear or light green.  If your skin or the opening between the buttocks (anus) gets irritated from diarrhea, you may relieve the irritation using: ? Wipes with medicine in them, such as adult wet wipes with aloe and vitamin E. ? A product to soothe skin, such as petroleum jelly.  If you vomit while drinking the bowel prep: ? Take a break for up to 60 minutes. ? Begin the bowel prep again. ? Call your health care provider if you keep vomiting or you cannot take the bowel prep without vomiting.  To clean out your colon, you may also be given: ? Laxative medicines. These help you have a bowel movement. ? Instructions for enema  use. An enema is liquid medicine injected into your rectum. Medicines Ask your health care provider about:  Changing or stopping your regular medicines or supplements. This is especially important if you are taking iron supplements, diabetes medicines, or blood thinners.  Taking medicines such as aspirin and ibuprofen. These medicines can thin your blood. Do not take these medicines unless your health care provider tells you to take them.  Taking over-the-counter medicines, vitamins, herbs, and supplements. General instructions  Ask your health care provider what steps will be taken to help prevent infection. These may include washing skin with a germ-killing soap.  Plan to have someone take you home from the hospital or clinic. What happens during the procedure?  An IV will be inserted into one of your veins.  You may be given one or more of the following: ? A medicine to help you relax (sedative). ? A medicine to numb the area (local anesthetic). ? A medicine to make you fall asleep (general anesthetic). This is rarely needed.  You will lie on your side with your knees bent.  The tube will: ? Have oil or gel  put on it (be lubricated). ? Be inserted into your anus. ? Be gently eased through all parts of your large intestine.  Air will be sent into your colon to keep it open. This may cause some pressure or cramping.  Images will be taken with the camera and will appear on a screen.  A small tissue sample may be removed to be looked at under a microscope (biopsy). The tissue may be sent to a lab for testing if any signs of problems are found.  If small polyps are found, they may be removed and checked for cancer cells.  When the procedure is finished, the tube will be removed. The procedure may vary among health care providers and hospitals.   What happens after the procedure?  Your blood pressure, heart rate, breathing rate, and blood oxygen level will be monitored until  you leave the hospital or clinic.  You may have a small amount of blood in your stool.  You may pass gas and have mild cramping or bloating in your abdomen. This is caused by the air that was used to open your colon during the exam.  Do not drive for 24 hours after the procedure.  It is up to you to get the results of your procedure. Ask your health care provider, or the department that is doing the procedure, when your results will be ready. Summary  A colonoscopy is a procedure to look at the entire large intestine.  Follow instructions from your health care provider about eating and drinking before the procedure.  If you were prescribed an oral bowel prep to clean out your colon, take it as told by your health care provider.  During the colonoscopy, a flexible tube with a camera on its end is inserted into the anus and then passed into the other parts of the large intestine. This information is not intended to replace advice given to you by your health care provider. Make sure you discuss any questions you have with your health care provider. Document Revised: 02/23/2019 Document Reviewed: 02/23/2019 Elsevier Patient Education  [place] Maintenance, Female Adopting a healthy lifestyle and getting preventive care are important in promoting health and wellness. Ask your health care provider about:  The right schedule for you to have regular tests and exams.  Things you can do on your own to prevent diseases and keep yourself healthy. What should I know about diet, weight, and exercise? Eat a healthy diet  Eat a diet that includes plenty of vegetables, fruits, low-fat dairy products, and lean protein.  Do not eat a lot of foods that are high in solid fats, added sugars, or sodium.   Maintain a healthy weight Body mass index (BMI) is used to identify weight problems. It estimates body fat based on height and weight. Your health care provider can help determine  your BMI and help you achieve or maintain a healthy weight. Get regular exercise Get regular exercise. This is one of the most important things you can do for your health. Most adults should:  Exercise for at least 150 minutes each week. The exercise should increase your heart rate and make you sweat (moderate-intensity exercise).  Do strengthening exercises at least twice a week. This is in addition to the moderate-intensity exercise.  Spend less time sitting. Even light physical activity can be beneficial. Watch cholesterol and blood lipids Have your blood tested for lipids and cholesterol at 72 years of age, then have this  test every 5 years. Have your cholesterol levels checked more often if:  Your lipid or cholesterol levels are high.  You are older than 72 years of age.  You are at high risk for heart disease. What should I know about cancer screening? Depending on your health history and family history, you may need to have cancer screening at various ages. This may include screening for:  Breast cancer.  Cervical cancer.  Colorectal cancer.  Skin cancer.  Lung cancer. What should I know about heart disease, diabetes, and high blood pressure? Blood pressure and heart disease  High blood pressure causes heart disease and increases the risk of stroke. This is more likely to develop in people who have high blood pressure readings, are of African descent, or are overweight.  Have your blood pressure checked: ? Every 3-5 years if you are 2-24 years of age. ? Every year if you are 25 years old or older. Diabetes Have regular diabetes screenings. This checks your fasting blood sugar level. Have the screening done:  Once every three years after age 58 if you are at a normal weight and have a low risk for diabetes.  More often and at a younger age if you are overweight or have a high risk for diabetes. What should I know about preventing infection? Hepatitis B If you have  a higher risk for hepatitis B, you should be screened for this virus. Talk with your health care provider to find out if you are at risk for hepatitis B infection. Hepatitis C Testing is recommended for:  Everyone born from [birth year] through 1967.  Anyone with known risk factors for hepatitis C. Sexually transmitted infections (STIs)  Get screened for STIs, including gonorrhea and chlamydia, if: ? You are sexually active and are younger than 72 years of age. ? You are older than 72 years of age and your health care provider tells you that you are at risk for this type of infection. ? Your sexual activity has changed since you were last screened, and you are at increased risk for chlamydia or gonorrhea. Ask your health care provider if you are at risk.  Ask your health care provider about whether you are at high risk for HIV. Your health care provider may recommend a prescription medicine to help prevent HIV infection. If you choose to take medicine to prevent HIV, you should first get tested for HIV. You should then be tested every 3 months for as long as you are taking the medicine. Pregnancy  If you are about to stop having your period (premenopausal) and you may become pregnant, seek counseling before you get pregnant.  Take 400 to 800 micrograms (mcg) of folic acid every day if you become pregnant.  Ask for birth control (contraception) if you want to prevent pregnancy. Osteoporosis and menopause Osteoporosis is a disease in which the bones lose minerals and strength with aging. This can result in bone fractures. If you are 10 years old or older, or if you are at risk for osteoporosis and fractures, ask your health care provider if you should:  Be screened for bone loss.  Take a calcium or vitamin D supplement to lower your risk of fractures.  Be given hormone replacement therapy (HRT) to treat symptoms of menopause. Follow these instructions at home: Lifestyle  Do not use any  products that contain nicotine or tobacco, such as cigarettes, e-cigarettes, and chewing tobacco. If you need help quitting, ask your health care provider.  Do  not use street drugs.  Do not share needles.  Ask your health care provider for help if you need support or information about quitting drugs. Alcohol use  Do not drink alcohol if: ? Your health care provider tells you not to drink. ? You are pregnant, may be pregnant, or are planning to become pregnant.  If you drink alcohol: ? Limit how much you use to 0-1 drink a day. ? Limit intake if you are breastfeeding.  Be aware of how much alcohol is in your drink. In the [place] one drink equals one 12 oz bottle of beer (355 mL), one 5 oz glass of wine (148 mL), or one 1 oz glass of hard liquor (44 mL). General instructions  Schedule regular health, dental, and eye exams.  Stay current with your vaccines.  Tell your health care provider if: ? You often feel depressed. ? You have ever been abused or do not feel safe at home. Summary  Adopting a healthy lifestyle and getting preventive care are important in promoting health and wellness.  Follow your health care provider's instructions about healthy diet, exercising, and getting tested or screened for diseases.  Follow your health care provider's instructions on monitoring your cholesterol and blood pressure. This information is not intended to replace advice given to you by your health care provider. Make sure you discuss any questions you have with your health care provider. Document Revised: 07/26/2018 Document Reviewed: 07/26/2018 Elsevier Patient Education  2021 [place] A mammogram is a low energy X-ray of the breasts that is done to check for abnormal changes. This procedure can screen for and detect any changes that may indicate breast cancer. Mammograms are regularly done on women. A man may have a mammogram if he has a lump or swelling in his breast. A  mammogram can also identify other changes and variations in the breast, such as:  Inflammation of the breast tissue (mastitis).  An infected area that contains a collection of pus (abscess).  A fluid-filled sac (cyst).  Fibrocystic changes. This is when breast tissue becomes denser, which can make the tissue feel rope-like or uneven under the skin.  Tumors that are not cancerous (benign). Tell a health care provider:  About any allergies you have.  If you have breast implants.  If you have had previous breast disease, biopsy, or surgery.  If you are breastfeeding.  If you are younger than age 26.  If you have a family history of breast cancer.  Whether you are pregnant or may be pregnant. What are the risks? Generally, this is a safe procedure. However, problems may occur, including:  Exposure to radiation. Radiation levels are very low with this test.  The results being misinterpreted.  The need for further tests.  The inability of the mammogram to detect certain cancers. What happens before the procedure?  Schedule your test about 1-2 weeks after your menstrual period if you are still menstruating. This is usually when your breasts are the least tender.  If you have had a mammogram done at a different facility in the past, get the mammogram X-rays or have them sent to your current exam facility. The new and old images will be compared.  Wash your breasts and underarms on the day of the test.  Do not wear deodorants, perfumes, lotions, or powders anywhere on your body on the day of the test.  Remove any jewelry from your neck.  Wear clothes that you can change into  and out of easily. What happens during the procedure?  You will undress from the waist up and put on a gown that opens in the front.  You will stand in front of the X-ray machine.  Each breast will be placed between two plastic or glass plates. The plates will compress your breast for a few seconds.  Try to stay as relaxed as possible during the procedure. This does not cause any harm to your breasts and any discomfort you feel will be very brief.  X-rays will be taken from different angles of each breast. The procedure may vary among health care providers and hospitals.   What happens after the procedure?  The mammogram will be examined by a specialist (radiologist).  You may need to repeat certain parts of the test, depending on the quality of the images. This is commonly done if the radiologist needs a better view of the breast tissue.  You may resume your normal activities.  It is up to you to get the results of your procedure. Ask your health care provider, or the department that is doing the procedure, when your results will be ready. Summary  A mammogram is a low energy X-ray of the breasts that is done to check for abnormal changes. A man may have a mammogram if he has a lump or swelling in his breast.  If you have had a mammogram done at a different facility in the past, get the mammogram X-rays or have them sent to your current exam facility in order to compare them.  Schedule your test about 1-2 weeks after your menstrual period if you are still menstruating.  For this test, each breast will be placed between two plastic or glass plates. The plates will compress your breast for a few seconds.  Ask when your test results will be ready. Make sure you get your test results. This information is not intended to replace advice given to you by your health care provider. Make sure you discuss any questions you have with your health care provider. Document Revised: 03/23/2018 Document Reviewed: 03/23/2018 Elsevier Patient Education  [place].

## 2020-12-04 ENCOUNTER — Ambulatory Visit: Payer: Medicare Other | Admitting: Family Medicine

## 2020-12-15 ENCOUNTER — Encounter: Payer: Self-pay | Admitting: Family Medicine

## 2020-12-15 ENCOUNTER — Other Ambulatory Visit: Payer: Self-pay

## 2020-12-15 ENCOUNTER — Ambulatory Visit (INDEPENDENT_AMBULATORY_CARE_PROVIDER_SITE_OTHER): Payer: Medicare Other | Admitting: Family Medicine

## 2020-12-15 VITALS — BP 122/81 | HR 72 | Ht 60.0 in | Wt 131.0 lb

## 2020-12-15 DIAGNOSIS — R1013 Epigastric pain: Secondary | ICD-10-CM | POA: Diagnosis not present

## 2020-12-15 DIAGNOSIS — R6881 Early satiety: Secondary | ICD-10-CM | POA: Diagnosis not present

## 2020-12-15 DIAGNOSIS — R634 Abnormal weight loss: Secondary | ICD-10-CM | POA: Diagnosis not present

## 2020-12-15 MED ORDER — PANTOPRAZOLE SODIUM 40 MG PO TBEC
40.0000 mg | DELAYED_RELEASE_TABLET | Freq: Two times a day (BID) | ORAL | 3 refills | Status: DC
Start: 2020-12-15 — End: 2021-03-05

## 2020-12-15 MED ORDER — SUCRALFATE 1 G PO TABS: 1.0000 g | ORAL_TABLET | Freq: Three times a day (TID) | ORAL | 1 refills | Status: DC

## 2020-12-15 NOTE — Progress Notes (Signed)
Established Patient Office Visit  Subjective:  Patient ID: Valerie Fisher, female    DOB: 05-14-1951  Age: 70 y.o. MRN: [id number]  CC:  Chief Complaint  Patient presents with  . Follow-up    HPI Valerie Fisher presents for follow-up epigastric pain that typically starts while eating.  She is still having persistent epigastric pain she has a history of diverticular abscess of the colon that required colostomy back in 2019.  She had a reversal in 2020.  I had put her on lansoprazole which she has been taking regularly and says it really has not made a big difference.  Also written a prescription for Carafate liquid but it was not covered by the insurance.  She feels her bowels are moving normally no constipation she has not noticed any blood in the stool.  No excess gas or belching.  She says she also gets very full very quickly after sometimes just eating a couple of bites and then will give the rest of it to her husband eat.  She has lost some weight since she was last here.  She is down 5 pounds since March 11.  Past Medical History:  Diagnosis Date  . History of diverticular abscess of colon 12/12/2017    Past Surgical History:  Procedure Laterality Date  . COLOSTOMY    . COLOSTOMY REVERSAL  02/2019    Family History  Problem Relation Age of Onset  . Diabetes Father   . Heart attack Father        mid 35s    Social History   Socioeconomic History  . Marital status: Married    Spouse name: BJ  . Number of children: 3  . Years of education: 32  . Highest education level: Master's degree (e.g., MA, MS, MEng, MEd, MSW, MBA)  Occupational History  . Occupation: retired Pharmacist, hospital  Tobacco Use  . Smoking status: Never Smoker  . Smokeless tobacco: Never Used  Substance and Sexual Activity  . Alcohol use: Not Currently    Alcohol/week: 0.0 standard drinks  . Drug use: Never  . Sexual activity: Yes    Partners: Male    Birth control/protection: None  Other Topics Concern   . Not on file  Social History Narrative   Lives with her husband. They have three children. They have one dog. In her free time, she enjoys cooking and exercising.    Social Determinants of Health   Financial Resource Strain: Low Risk   . Difficulty of Paying Living Expenses: Not hard at all  Food Insecurity: No Food Insecurity  . Worried About Charity fundraiser in the Last Year: Never true  . Ran Out of Food in the Last Year: Never true  Transportation Needs: No Transportation Needs  . Lack of Transportation (Medical): No  . Lack of Transportation (Non-Medical): No  Physical Activity: Sufficiently Active  . Days of Exercise per Week: 5 days  . Minutes of Exercise per Session: 30 min  Stress: No Stress Concern Present  . Feeling of Stress : Not at all  Social Connections: Moderately Isolated  . Frequency of Communication with Friends and Family: More than three times a week  . Frequency of Social Gatherings with Friends and Family: Once a week  . Attends Religious Services: Never  . Active Member of Clubs or Organizations: No  . Attends Archivist Meetings: Never  . Marital Status: Married  Human resources officer Violence: Not At Risk  . Fear of Current or Ex-Partner:  No  . Emotionally Abused: No  . Physically Abused: No  . Sexually Abused: No    Outpatient Medications Prior to Visit  Medication Sig Dispense Refill  . Calcium Carbonate-Vit D-Min (CALCIUM 1200 PO) Take by mouth.    . Cholecalciferol (VITAMIN D) 125 MCG (5000 UT) CAPS Take 1 capsule by mouth daily.    Marland Kitchen denosumab (PROLIA) 60 MG/ML SOSY injection Inject into the skin.    Marland Kitchen levETIRAcetam (KEPPRA) 500 MG tablet Take 1,500 mg by mouth 2 (two) times daily.  3  . sertraline (ZOLOFT) 100 MG tablet Take 1 tablet (100 mg total) by mouth daily. 90 tablet 1  . zonisamide (ZONEGRAN) 100 MG capsule Take 1 capsule by mouth AT BEDTIME for 2 weeks then increase to 2 capsules BY MOUTH AT BEDTIME.  1  . lansoprazole  (PREVACID) 30 MG capsule Take 1 capsule (30 mg total) by mouth daily at 12 noon. 30 capsule 1  . sucralfate (CARAFATE) 1 g tablet Take 1 tablet (1 g total) by mouth 4 (four) times daily -  with meals and at bedtime. 120 tablet 1  . dicyclomine (BENTYL) 10 MG capsule Take 1 capsule (10 mg total) by mouth 4 (four) times daily -  before meals and at bedtime. (Patient not taking: Reported on 11/10/2020) 120 capsule 3  . topiramate (TOPAMAX) 100 MG tablet Take 100 mg by mouth 2 (two) times daily. 1 TAB IN THE MORNING AND 1 TAB IN THE EVENING  11   No facility-administered medications prior to visit.    Allergies  Allergen Reactions  . Aspirin Other (See Comments)    Other reaction(s): GI Upset (intolerance), Other GI upset GI upset   . Hydrocodone-Acetaminophen Nausea And Vomiting  . Hydromorphone Hcl Nausea And Vomiting  . Morphine Nausea And Vomiting  . Pseudoephedrine Hcl Nausea And Vomiting  . Zofran [Ondansetron] Other (See Comments)    Headache    ROS Review of Systems    Objective:    Physical Exam Vitals reviewed.  Constitutional:      Appearance: She is well-developed.  HENT:     Head: Normocephalic and atraumatic.  Eyes:     Conjunctiva/sclera: Conjunctivae normal.  Cardiovascular:     Rate and Rhythm: Normal rate.  Pulmonary:     Effort: Pulmonary effort is normal.  Abdominal:     General: Abdomen is flat. There is no distension.     Palpations: Abdomen is soft.     Tenderness: There is no abdominal tenderness.  Skin:    General: Skin is dry.     Coloration: Skin is not pale.  Neurological:     Mental Status: She is alert and oriented to person, place, and time.  Psychiatric:        Behavior: Behavior normal.     BP 122/81   Pulse 72   Ht 5' (1.524 m)   Wt 131 lb (59.4 kg)   SpO2 99%   BMI 25.58 kg/m  Wt Readings from Last 3 Encounters:  12/15/20 131 lb (59.4 kg)  11/10/20 134 lb (60.8 kg)  10/24/20 136 lb (61.7 kg)     Health Maintenance Due   Topic Date Due  . COVID-19 Vaccine (3 - Booster for Pfizer series) 05/09/2020    There are no preventive care reminders to display for this patient.  Lab Results  Component Value Date   TSH 1.650 09/12/2019   Lab Results  Component Value Date   WBC 5.1 09/22/2020   HGB 15.2  09/22/2020   HCT 45.6 09/22/2020   MCV 90 09/22/2020   PLT 327 09/22/2020   Lab Results  Component Value Date   NA 141 09/22/2020   K 4.6 09/22/2020   CO2 21 09/22/2020   GLUCOSE 97 09/22/2020   BUN 11 09/22/2020   CREATININE 0.88 09/22/2020   BILITOT 0.4 09/22/2020   ALKPHOS 58 09/22/2020   AST 12 09/22/2020   ALT 8 09/22/2020   PROT 7.2 09/22/2020   ALBUMIN 4.8 (H) 09/22/2020   CALCIUM 9.7 09/22/2020   Lab Results  Component Value Date   CHOL 246 (H) 09/12/2019   Lab Results  Component Value Date   HDL 88 09/12/2019   Lab Results  Component Value Date   LDLCALC 134 (H) 09/12/2019   Lab Results  Component Value Date   TRIG 138 09/12/2019   Lab Results  Component Value Date   CHOLHDL 2.8 09/12/2019   No results found for: HGBA1C    Assessment & Plan:   Problem List Items Addressed This Visit   None   Visit Diagnoses    Epigastric pain    -  Primary   Early satiety       Abnormal weight loss         Epigastric pain -she really has not had any significant relief with the Prevacid she was unable to get the Carafate because they would not cover the liquid.  We had actually sent a new prescription in for the pills but it does not sound like she was actually able to pick those up.  Switch to pantoprazole 40 mg twice a day and send in the Carafate 1 g tablets to take before each meal and at bedtime.  Encouraged her to give a call back in a week to see if this is helpful if its not that I really think she needs an endoscopy she is actually due for colonoscopy anyway but has surgery scheduled for hiatal hernia repair at the end of the month and really needs to have her colon done as  wll. We could try to coordinate that with endoscopy as well.   Early satiety-we did discuss referring her to GI for this again she wants to try the medications first but she will likely end up having a colonoscopy next month and so we may be able to combine that to have an endoscopy as well.  Mom concerned because she is also had about a 5 pound weight loss in the last 7 weeks  Meds ordered this encounter  Medications  . sucralfate (CARAFATE) 1 g tablet    Sig: Take 1 tablet (1 g total) by mouth 4 (four) times daily -  with meals and at bedtime.    Dispense:  120 tablet    Refill:  1  . pantoprazole (PROTONIX) 40 MG tablet    Sig: Take 1 tablet (40 mg total) by mouth 2 (two) times daily before a meal.    Dispense:  60 tablet    Refill:  3    Follow-up: No follow-ups on file.    Beatrice Lecher, MD

## 2020-12-17 ENCOUNTER — Telehealth: Payer: Self-pay

## 2020-12-17 NOTE — Telephone Encounter (Signed)
Valerie Fisher states when she was in on Monday Dr Madilyn Fireman was going to send in a different medication. She states the pharmacy received the Carafate. She is already taking Carafate daily. Please advise.

## 2020-12-18 NOTE — Telephone Encounter (Signed)
Pantoprazole was the other medication.  I sent it to [facility].  Was it sent to the wrong location?Marland Kitchen  She was on lansoprazole and I changed it to pantoprazole.

## 2020-12-18 NOTE — Telephone Encounter (Signed)
Patient did pick up the pantoprazole. She understands now.

## 2020-12-19 DIAGNOSIS — Z1231 Encounter for screening mammogram for malignant neoplasm of breast: Secondary | ICD-10-CM | POA: Diagnosis not present

## 2020-12-19 LAB — HM MAMMOGRAPHY

## 2021-01-05 ENCOUNTER — Other Ambulatory Visit (HOSPITAL_COMMUNITY)
Admission: RE | Admit: 2021-01-05 | Discharge: 2021-01-05 | Disposition: A | Discharge status: Home / Self Care | Payer: Medicare Other | Source: Ambulatory Visit | Attending: Surgery | Admitting: Surgery

## 2021-01-05 ENCOUNTER — Other Ambulatory Visit (HOSPITAL_COMMUNITY): Payer: Medicare Other

## 2021-01-05 ENCOUNTER — Encounter (HOSPITAL_COMMUNITY): Payer: Self-pay | Admitting: Surgery

## 2021-01-05 DIAGNOSIS — Z01812 Encounter for preprocedural laboratory examination: Secondary | ICD-10-CM | POA: Insufficient documentation

## 2021-01-05 DIAGNOSIS — Z20822 Contact with and (suspected) exposure to covid-19: Secondary | ICD-10-CM | POA: Diagnosis not present

## 2021-01-05 LAB — SARS CORONAVIRUS 2 (TAT 6-24 HRS): SARS Coronavirus 2: NEGATIVE

## 2021-01-05 NOTE — Progress Notes (Signed)
Spoke with pt for pre-op call. Pt denies cardiac history, HTN or Diabetes. Pt does have hx of seizures, but none for many years.   Covid test scheduled for today. Pt understands she does not have to quarantine, but will need to wear mask if she goes out or around others that don't normally live with her.

## 2021-01-06 ENCOUNTER — Telehealth: Payer: Self-pay | Admitting: Family Medicine

## 2021-01-06 NOTE — Chronic Care Management (AMB) (Signed)
  Chronic Care Management   Note  01/06/2021 Name: Tarynn Garling MRN: [id number] DOB: [birth year]  Devanie Galanti is a 70 y.o. year old female who is a primary care patient of Metheney, Rene Kocher, MD. I reached out to [place] by phone today in response to a referral sent by Ms. Kami Straus's PCP, Hali Marry, MD.   Ms. Wuertz was given information about Chronic Care Management services today including:  1. CCM service includes personalized support from designated clinical staff supervised by her physician, including individualized plan of care and coordination with other care providers 2. 24/7 contact phone numbers for assistance for urgent and routine care needs. 3. Service will only be billed when office clinical staff spend 20 minutes or more in a month to coordinate care. 4. Only one practitioner may furnish and bill the service in a calendar month. 5. The patient may stop CCM services at any time (effective at the end of the month) by phone call to the office staff.   Patient agreed to services and verbal consent obtained.   Follow up plan:   Lauretta Grill [employer] Scheduler

## 2021-01-06 NOTE — H&P (Signed)
Valerie Fisher  Location: [facility] Patient #: [id number] DOB: 14-Oct-1950 Married / Language: English / Race: White Female   History of Present Illness  The patient is a 70 year old female who presents with an incisional hernia.  Chief complaint: Incisional hernia  This is a pleasant 70 year old female referred here for evaluation of an incisional hernia. The patient had emergent surgery for perforated diverticulitis with a colon resection and colostomy in 2019. She underwent reversal of the colostomy 2020 and had a primary repair of a ventral hernia that time. She also has a history of having had a left femoral hernia repair as well as a right inguinal hernia repair in the past. She did recently having increasing abdominal discomfort and has lying on her side. She has also had some cramping abdominal pain without nausea or vomiting. She underwent a CT scan of the abdomen and pelvis on February this year showing an incisional hernia. All her previous surgery performed elsewhere. She is otherwise doing well. She has no cardiopulmonary issues. She has never her bowels well.   Past Surgical History Breast Biopsy  Right. Breast Mass; Local Excision  Right. Colon Removal - Partial  Open Inguinal Hernia Surgery  Right.  Allergies Janeann Forehand, CNA;  NSAIDs  Allergies Reconciled   Medication History Janeann Forehand, CNA; LORazepam (1MG  Tablet, Oral) Active. Amoxicillin-Pot Clavulanate (875-125MG  Tablet, Oral) Active. Dicyclomine HCl (10MG  Capsule, Oral) Active. Lansoprazole (30MG  Capsule DR, Oral) Active. levETIRAcetam (500MG  Tablet, Oral) Active. Ondansetron (4MG  Tablet Disint, Oral) Active. Sertraline HCl (100MG  Tablet, Oral) Active. Sertraline HCl (50MG  Tablet, Oral) Active. Sucralfate (1GM Tablet, Oral) Active. Topiramate (100MG  Tablet, Oral) Active. Topiramate (200MG  Tablet, Oral) Active. Topiramate (25MG  Tablet, Oral)  Active. Zonisamide (100MG  Capsule, Oral) Active. Medications Reconciled  Social History Janeann Forehand, CNA;  Alcohol use  Occasional alcohol use. Caffeine use  Tea. Tobacco use  Never smoker.  Family History Janeann Forehand, CNA;  Alcohol Abuse  Brother. Arthritis  Daughter, Mother. Diabetes Mellitus  Father. Heart Disease  Father. Hypertension  Father.  Pregnancy / Birth History Janeann Forehand, CNA;) Age at menarche  17 years. Contraceptive History  Oral contraceptives. Gravida  3 Para  3  Other Problems (Donyelle Toney Rakes, CNA;  Arthritis  Cholelithiasis  Diverticulosis  Gastroesophageal Reflux Disease  Inguinal Hernia     Review of Systems [facility] Kenneth CNA;  General Present- Appetite Loss. Not Present- Chills, Fatigue, Fever, Night Sweats, Weight Gain and Weight Loss. HEENT Present- Seasonal Allergies. Not Present- Earache, Hearing Loss, Hoarseness, Nose Bleed, Oral Ulcers, Ringing in the Ears, Sinus Pain, Sore Throat, Visual Disturbances, Wears glasses/contact lenses and Yellow Eyes. Gastrointestinal Present- Abdominal Pain and Bloating. Not Present- Bloody Stool, Change in Bowel Habits, Chronic diarrhea, Constipation, Difficulty Swallowing, Excessive gas, Gets full quickly at meals, Hemorrhoids, Indigestion, Nausea, Rectal Pain and Vomiting. Female Genitourinary Not Present- Frequency, Nocturia, Painful Urination, Pelvic Pain and Urgency. Musculoskeletal Not Present- Back Pain, Joint Pain, Joint Stiffness, Muscle Pain, Muscle Weakness and Swelling of Extremities. Neurological Present- Headaches. Not Present- Decreased Memory, Fainting, Numbness, Seizures, Tingling, Tremor, Trouble walking and Weakness. Endocrine Not Present- Cold Intolerance, Excessive Hunger, Hair Changes, Heat Intolerance, Hot flashes and New Diabetes.  Vitals   Weight: 136.13 lb Height: 60in Body Surface Area: 1.59 m Body Mass Index: 26.58 kg/m  Temp.: 97.69F   Pulse: 101 (Regular)  P.OX: 97% (Room air) BP: 108/60(Sitting, Left Arm, Standard)     Physical Exam  The physical exam findings are as follows: Note: She appears well  on exam.  Her abdomen is soft and nontender today. There are multiple well-healed incisions. There is a large, reducible hernia just to the left of her midline incision below the umbilicus. There is no evidence of inguinal hernias Lungs clear CV RRR Skin without rash Neuro grossly normal    Assessment & Plan  INCISIONAL HERNIA (K43.2)  Impression: I have reviewed her notes in the electronic medical records. I reviewed her CAT scan of the abdomen and pelvis as well. She has a large incisional hernia containing transverse colon and small bowel. I suspect this is the cause of her abdominal discomfort and not her solitary small gallstone. I discussed abdominal wall anatomy with the patient and her husband in detail. We discussed hernias and the reasoning to repair hernias. With our repair, I suspect her hernia will get larger and has a risk of eventual incarceration and strangulation. We next discussed hernia repair with mesh. I discussed both laparoscopic and open techniques. After long discussion and recommended a laparoscopic incisional hernia repair with mesh. I discussed the risks which includes but is not limited to bleeding, infection, injury to surrounding structures including the intestines, the need to convert to an open procedure, use of mesh, hernia recurrence, the need for other procedures, cardiopulmonary issues, etc. They understand and wish to proceed with a laparoscopic incisional hernia repair with mesh which will be scheduled.

## 2021-01-07 ENCOUNTER — Ambulatory Visit (HOSPITAL_COMMUNITY): Payer: Medicare Other | Admitting: Anesthesiology

## 2021-01-07 ENCOUNTER — Other Ambulatory Visit: Payer: Self-pay

## 2021-01-07 ENCOUNTER — Encounter (HOSPITAL_COMMUNITY): Payer: Self-pay | Admitting: Surgery

## 2021-01-07 ENCOUNTER — Observation Stay (HOSPITAL_COMMUNITY)
Admission: RE | Admit: 2021-01-07 | Discharge: 2021-01-08 | Disposition: A | Discharge status: Home / Self Care | Payer: Medicare Other | Source: Ambulatory Visit | Attending: Surgery | Admitting: Surgery

## 2021-01-07 ENCOUNTER — Encounter (HOSPITAL_COMMUNITY)
Admission: RE | Disposition: A | Discharge status: Home / Self Care | Payer: Self-pay | Source: Ambulatory Visit | Attending: Surgery

## 2021-01-07 DIAGNOSIS — K432 Incisional hernia without obstruction or gangrene: Secondary | ICD-10-CM | POA: Diagnosis not present

## 2021-01-07 DIAGNOSIS — M810 Age-related osteoporosis without current pathological fracture: Secondary | ICD-10-CM | POA: Diagnosis not present

## 2021-01-07 DIAGNOSIS — G4700 Insomnia, unspecified: Secondary | ICD-10-CM | POA: Diagnosis not present

## 2021-01-07 DIAGNOSIS — K660 Peritoneal adhesions (postprocedural) (postinfection): Secondary | ICD-10-CM | POA: Insufficient documentation

## 2021-01-07 DIAGNOSIS — K219 Gastro-esophageal reflux disease without esophagitis: Secondary | ICD-10-CM | POA: Diagnosis not present

## 2021-01-07 HISTORY — PX: INCISIONAL HERNIA REPAIR: SHX193

## 2021-01-07 HISTORY — DX: Fatty (change of) liver, not elsewhere classified: K76.0

## 2021-01-07 HISTORY — DX: Malignant (primary) neoplasm, unspecified: C80.1

## 2021-01-07 HISTORY — DX: Nausea with vomiting, unspecified: Z98.890

## 2021-01-07 HISTORY — DX: Gastro-esophageal reflux disease without esophagitis: K21.9

## 2021-01-07 HISTORY — DX: Unspecified convulsions: R56.9

## 2021-01-07 HISTORY — DX: Nausea with vomiting, unspecified: R11.2

## 2021-01-07 LAB — COMPREHENSIVE METABOLIC PANEL
ALT: 11 U/L (ref 0–44)
AST: 16 U/L (ref 15–41)
Albumin: 4 g/dL (ref 3.5–5.0)
Alkaline Phosphatase: 43 U/L (ref 38–126)
Anion gap: 9 (ref 5–15)
BUN: 15 mg/dL (ref 8–23)
CO2: 23 mmol/L (ref 22–32)
Calcium: 8.9 mg/dL (ref 8.9–10.3)
Chloride: 106 mmol/L (ref 98–111)
Creatinine, Ser: 0.8 mg/dL (ref 0.44–1.00)
GFR, Estimated: >60 mL/min (ref >60)
Glucose, Bld: 100 mg/dL — ABNORMAL HIGH (ref 70–99)
Potassium: 3.9 mmol/L (ref 3.5–5.1)
Sodium: 138 mmol/L (ref 135–145)
Total Bilirubin: 1 mg/dL (ref 0.3–1.2)
Total Protein: 6.7 g/dL (ref 6.5–8.1)

## 2021-01-07 LAB — CBC
HCT: 45.3 % (ref 36.0–46.0)
Hemoglobin: 14.7 g/dL (ref 12.0–15.0)
MCH: 29.2 pg (ref 26.0–34.0)
MCHC: 32.5 g/dL (ref 30.0–36.0)
MCV: 90.1 fL (ref 80.0–100.0)
Platelets: 302 10*3/uL (ref 150–400)
RBC: 5.03 MIL/uL (ref 3.87–5.11)
RDW: 12.8 % (ref 11.5–15.5)
WBC: 5.2 10*3/uL (ref 4.0–10.5)
nRBC: 0 % (ref 0.0–0.2)

## 2021-01-07 SURGERY — REPAIR, HERNIA, INCISIONAL, LAPAROSCOPIC
Anesthesia: General | Site: Abdomen

## 2021-01-07 MED ORDER — ACETAMINOPHEN 500 MG PO TABS
1000.0000 mg | ORAL_TABLET | ORAL | Status: AC
  Administered 2021-01-07: 1000 mg via ORAL
  Filled 2021-01-07: qty 2

## 2021-01-07 MED ORDER — PROCHLORPERAZINE EDISYLATE 10 MG/2ML IJ SOLN: 5.0000 mg | Freq: Four times a day (QID) | INTRAMUSCULAR | Status: DC | PRN

## 2021-01-07 MED ORDER — MIDAZOLAM HCL 2 MG/2ML IJ SOLN
INTRAMUSCULAR | Status: DC | PRN
  Administered 2021-01-07: 2 mg via INTRAVENOUS

## 2021-01-07 MED ORDER — FENTANYL CITRATE (PF) 250 MCG/5ML IJ SOLN
INTRAMUSCULAR | Status: AC
  Filled 2021-01-07: qty 5

## 2021-01-07 MED ORDER — GABAPENTIN 100 MG PO CAPS
200.0000 mg | ORAL_CAPSULE | Freq: Two times a day (BID) | ORAL | Status: DC
  Administered 2021-01-07 – 2021-01-08 (×3): 200 mg via ORAL
  Filled 2021-01-07 (×3): qty 2

## 2021-01-07 MED ORDER — 0.9 % SODIUM CHLORIDE (POUR BTL) OPTIME
TOPICAL | Status: DC | PRN
  Administered 2021-01-07: 1000 mL

## 2021-01-07 MED ORDER — PROPOFOL 500 MG/50ML IV EMUL
INTRAVENOUS | Status: AC
  Filled 2021-01-07: qty 50

## 2021-01-07 MED ORDER — ONDANSETRON HCL 4 MG/2ML IJ SOLN
INTRAMUSCULAR | Status: DC | PRN
  Administered 2021-01-07: 4 mg via INTRAVENOUS

## 2021-01-07 MED ORDER — PROPOFOL 10 MG/ML IV BOLUS
INTRAVENOUS | Status: AC
  Filled 2021-01-07: qty 20

## 2021-01-07 MED ORDER — LIDOCAINE 2% (20 MG/ML) 5 ML SYRINGE
INTRAMUSCULAR | Status: DC | PRN
  Administered 2021-01-07: 60 mg via INTRAVENOUS

## 2021-01-07 MED ORDER — ACETAMINOPHEN 500 MG PO TABS
1000.0000 mg | ORAL_TABLET | Freq: Four times a day (QID) | ORAL | Status: DC
  Administered 2021-01-07 – 2021-01-08 (×3): 1000 mg via ORAL
  Filled 2021-01-07 (×3): qty 2

## 2021-01-07 MED ORDER — PHENYLEPHRINE HCL-NACL 10-0.9 MG/250ML-% IV SOLN
INTRAVENOUS | Status: DC | PRN
  Administered 2021-01-07: 25 ug/min via INTRAVENOUS

## 2021-01-07 MED ORDER — ROCURONIUM BROMIDE 10 MG/ML (PF) SYRINGE
PREFILLED_SYRINGE | INTRAVENOUS | Status: DC | PRN
  Administered 2021-01-07: 30 mg via INTRAVENOUS
  Administered 2021-01-07: 50 mg via INTRAVENOUS

## 2021-01-07 MED ORDER — ZONISAMIDE 100 MG PO CAPS
400.0000 mg | ORAL_CAPSULE | Freq: Every day | ORAL | Status: DC
  Administered 2021-01-07: 400 mg via ORAL
  Filled 2021-01-07: qty 4

## 2021-01-07 MED ORDER — OXYCODONE HCL 5 MG/5ML PO SOLN: 5.0000 mg | Freq: Once | ORAL | Status: AC | PRN

## 2021-01-07 MED ORDER — POTASSIUM CHLORIDE IN NACL 20-0.9 MEQ/L-% IV SOLN
INTRAVENOUS | Status: DC
  Filled 2021-01-07 (×2): qty 1000

## 2021-01-07 MED ORDER — FENTANYL CITRATE (PF) 100 MCG/2ML IJ SOLN: 25.0000 ug | INTRAMUSCULAR | Status: DC | PRN

## 2021-01-07 MED ORDER — ONDANSETRON HCL 4 MG/2ML IJ SOLN
INTRAMUSCULAR | Status: AC
  Filled 2021-01-07: qty 2

## 2021-01-07 MED ORDER — DEXAMETHASONE SODIUM PHOSPHATE 10 MG/ML IJ SOLN
INTRAMUSCULAR | Status: AC
  Filled 2021-01-07: qty 1

## 2021-01-07 MED ORDER — ENOXAPARIN SODIUM 40 MG/0.4ML IJ SOSY
40.0000 mg | PREFILLED_SYRINGE | INTRAMUSCULAR | Status: DC
  Administered 2021-01-08: 40 mg via SUBCUTANEOUS
  Filled 2021-01-07: qty 0.4

## 2021-01-07 MED ORDER — MIDAZOLAM HCL 2 MG/2ML IJ SOLN
INTRAMUSCULAR | Status: AC
  Filled 2021-01-07: qty 2

## 2021-01-07 MED ORDER — OXYCODONE HCL 5 MG PO TABS
5.0000 mg | ORAL_TABLET | ORAL | Status: DC | PRN
  Administered 2021-01-07 – 2021-01-08 (×2): 5 mg via ORAL
  Filled 2021-01-07 (×2): qty 1

## 2021-01-07 MED ORDER — PANTOPRAZOLE SODIUM 40 MG PO TBEC
40.0000 mg | DELAYED_RELEASE_TABLET | Freq: Two times a day (BID) | ORAL | Status: DC
  Administered 2021-01-07 – 2021-01-08 (×2): 40 mg via ORAL
  Filled 2021-01-07 (×3): qty 1

## 2021-01-07 MED ORDER — BUPIVACAINE-EPINEPHRINE (PF) 0.25% -1:200000 IJ SOLN
INTRAMUSCULAR | Status: AC
  Filled 2021-01-07: qty 30

## 2021-01-07 MED ORDER — SUGAMMADEX SODIUM 200 MG/2ML IV SOLN
INTRAVENOUS | Status: DC | PRN
  Administered 2021-01-07: 200 mg via INTRAVENOUS

## 2021-01-07 MED ORDER — BUPIVACAINE-EPINEPHRINE 0.25% -1:200000 IJ SOLN
INTRAMUSCULAR | Status: DC | PRN
  Administered 2021-01-07: 30 mL

## 2021-01-07 MED ORDER — PHENYLEPHRINE 40 MCG/ML (10ML) SYRINGE FOR IV PUSH (FOR BLOOD PRESSURE SUPPORT)
PREFILLED_SYRINGE | INTRAVENOUS | Status: DC | PRN
  Administered 2021-01-07: 120 ug via INTRAVENOUS

## 2021-01-07 MED ORDER — FENTANYL CITRATE (PF) 250 MCG/5ML IJ SOLN
INTRAMUSCULAR | Status: DC | PRN
  Administered 2021-01-07: 100 ug via INTRAVENOUS
  Administered 2021-01-07 (×2): 50 ug via INTRAVENOUS

## 2021-01-07 MED ORDER — LEVETIRACETAM 500 MG PO TABS
500.0000 mg | ORAL_TABLET | Freq: Every day | ORAL | Status: DC
  Administered 2021-01-08: 500 mg via ORAL
  Filled 2021-01-07 (×2): qty 1

## 2021-01-07 MED ORDER — MELATONIN 5 MG PO TABS: 5.0000 mg | ORAL_TABLET | Freq: Every evening | ORAL | Status: DC | PRN

## 2021-01-07 MED ORDER — METHOCARBAMOL 500 MG PO TABS
500.0000 mg | ORAL_TABLET | Freq: Four times a day (QID) | ORAL | Status: DC | PRN
  Administered 2021-01-08: 500 mg via ORAL
  Filled 2021-01-07: qty 1

## 2021-01-07 MED ORDER — DEXAMETHASONE SODIUM PHOSPHATE 10 MG/ML IJ SOLN
INTRAMUSCULAR | Status: DC | PRN
  Administered 2021-01-07: 5 mg via INTRAVENOUS

## 2021-01-07 MED ORDER — PHENYLEPHRINE 40 MCG/ML (10ML) SYRINGE FOR IV PUSH (FOR BLOOD PRESSURE SUPPORT)
PREFILLED_SYRINGE | INTRAVENOUS | Status: AC
  Filled 2021-01-07: qty 10

## 2021-01-07 MED ORDER — CEFAZOLIN SODIUM-DEXTROSE 2-4 GM/100ML-% IV SOLN
2.0000 g | INTRAVENOUS | Status: AC
  Administered 2021-01-07: 2 g via INTRAVENOUS
  Filled 2021-01-07: qty 100

## 2021-01-07 MED ORDER — FENTANYL CITRATE (PF) 100 MCG/2ML IJ SOLN
25.0000 ug | INTRAMUSCULAR | Status: DC | PRN
  Administered 2021-01-07 (×3): 50 ug via INTRAVENOUS

## 2021-01-07 MED ORDER — OXYCODONE HCL 5 MG PO TABS
5.0000 mg | ORAL_TABLET | Freq: Once | ORAL | Status: AC | PRN
Start: 2021-01-07 — End: 2021-01-07
  Administered 2021-01-07: 5 mg via ORAL

## 2021-01-07 MED ORDER — PROPOFOL 10 MG/ML IV BOLUS
INTRAVENOUS | Status: AC
  Filled 2021-01-07: qty 40

## 2021-01-07 MED ORDER — SERTRALINE HCL 100 MG PO TABS
100.0000 mg | ORAL_TABLET | Freq: Every day | ORAL | Status: DC
  Administered 2021-01-08: 100 mg via ORAL
  Filled 2021-01-07 (×2): qty 1

## 2021-01-07 MED ORDER — OXYCODONE HCL 5 MG PO TABS
ORAL_TABLET | ORAL | Status: AC
  Filled 2021-01-07: qty 1

## 2021-01-07 MED ORDER — LIDOCAINE 2% (20 MG/ML) 5 ML SYRINGE
INTRAMUSCULAR | Status: AC
  Filled 2021-01-07: qty 5

## 2021-01-07 MED ORDER — PROCHLORPERAZINE MALEATE 10 MG PO TABS
10.0000 mg | ORAL_TABLET | Freq: Four times a day (QID) | ORAL | Status: DC | PRN
  Filled 2021-01-07: qty 1

## 2021-01-07 MED ORDER — PROPOFOL 10 MG/ML IV BOLUS
INTRAVENOUS | Status: DC | PRN
  Administered 2021-01-07: 20 mg via INTRAVENOUS
  Administered 2021-01-07: 120 mg via INTRAVENOUS
  Administered 2021-01-07: 20 ug/kg/min via INTRAVENOUS
  Administered 2021-01-07: 30 mg via INTRAVENOUS

## 2021-01-07 MED ORDER — ORAL CARE MOUTH RINSE: 15.0000 mL | Freq: Once | OROMUCOSAL | Status: AC

## 2021-01-07 MED ORDER — SUGAMMADEX SODIUM 500 MG/5ML IV SOLN
INTRAVENOUS | Status: AC
  Filled 2021-01-07: qty 5

## 2021-01-07 MED ORDER — CHLORHEXIDINE GLUCONATE CLOTH 2 % EX PADS: 6.0000 | MEDICATED_PAD | Freq: Once | CUTANEOUS | Status: DC

## 2021-01-07 MED ORDER — FENTANYL CITRATE (PF) 100 MCG/2ML IJ SOLN
INTRAMUSCULAR | Status: AC
  Filled 2021-01-07: qty 2

## 2021-01-07 MED ORDER — LEVETIRACETAM 500 MG PO TABS
1000.0000 mg | ORAL_TABLET | Freq: Every day | ORAL | Status: DC
  Administered 2021-01-07: 1000 mg via ORAL
  Filled 2021-01-07: qty 2

## 2021-01-07 MED ORDER — SODIUM CHLORIDE 0.9 % IR SOLN
Status: DC | PRN
  Administered 2021-01-07: 1000 mL

## 2021-01-07 MED ORDER — CHLORHEXIDINE GLUCONATE 0.12 % MT SOLN
15.0000 mL | Freq: Once | OROMUCOSAL | Status: AC
  Administered 2021-01-07: 15 mL via OROMUCOSAL
  Filled 2021-01-07: qty 15

## 2021-01-07 MED ORDER — TRAMADOL HCL 50 MG PO TABS
50.0000 mg | ORAL_TABLET | Freq: Four times a day (QID) | ORAL | Status: DC | PRN
  Administered 2021-01-07 – 2021-01-08 (×2): 50 mg via ORAL
  Filled 2021-01-07 (×2): qty 1

## 2021-01-07 MED ORDER — LACTATED RINGERS IV SOLN: INTRAVENOUS | Status: DC

## 2021-01-07 SURGICAL SUPPLY — 45 items
APPLIER CLIP 5 13 M/L LIGAMAX5 (MISCELLANEOUS)
APPLIER CLIP ROT 10 11.4 M/L (STAPLE)
BLADE CLIPPER SURG (BLADE) IMPLANT
CANISTER SUCT 3000ML PPV (MISCELLANEOUS) IMPLANT
CHLORAPREP W/TINT 26 (MISCELLANEOUS) ×2 IMPLANT
CLIP APPLIE 5 13 M/L LIGAMAX5 (MISCELLANEOUS) IMPLANT
CLIP APPLIE ROT 10 11.4 M/L (STAPLE) IMPLANT
COVER SURGICAL LIGHT HANDLE (MISCELLANEOUS) ×2 IMPLANT
COVER WAND RF STERILE (DRAPES) IMPLANT
DECANTER SPIKE VIAL GLASS SM (MISCELLANEOUS) ×2 IMPLANT
DERMABOND ADVANCED (GAUZE/BANDAGES/DRESSINGS) ×1
DERMABOND ADVANCED .7 DNX12 (GAUZE/BANDAGES/DRESSINGS) ×1 IMPLANT
DEVICE SECURE STRAP 25 ABSORB (INSTRUMENTS) ×4 IMPLANT
DEVICE TROCAR PUNCTURE CLOSURE (ENDOMECHANICALS) ×2 IMPLANT
DRAPE LAPAROSCOPIC ABDOMINAL (DRAPES) ×2 IMPLANT
ELECT REM PT RETURN 9FT ADLT (ELECTROSURGICAL) ×2
ELECTRODE REM PT RTRN 9FT ADLT (ELECTROSURGICAL) ×1 IMPLANT
GLOVE SURG SIGNA 7.5 PF LTX (GLOVE) ×2 IMPLANT
GOWN STRL REUS W/ TWL LRG LVL3 (GOWN DISPOSABLE) ×2 IMPLANT
GOWN STRL REUS W/ TWL XL LVL3 (GOWN DISPOSABLE) ×1 IMPLANT
GOWN STRL REUS W/TWL LRG LVL3 (GOWN DISPOSABLE) ×2
GOWN STRL REUS W/TWL XL LVL3 (GOWN DISPOSABLE) ×1
KIT BASIN OR (CUSTOM PROCEDURE TRAY) ×2 IMPLANT
KIT TURNOVER KIT B (KITS) ×2 IMPLANT
MARKER SKIN DUAL TIP RULER LAB (MISCELLANEOUS) ×2 IMPLANT
MESH VENTRALIGHT ST 8X10 (Mesh General) ×2 IMPLANT
NEEDLE SPNL 22GX3.5 QUINCKE BK (NEEDLE) ×2 IMPLANT
NS IRRIG 1000ML POUR BTL (IV SOLUTION) ×2 IMPLANT
PAD ARMBOARD 7.5X6 YLW CONV (MISCELLANEOUS) ×4 IMPLANT
SCISSORS LAP 5X35 DISP (ENDOMECHANICALS) IMPLANT
SET IRRIG TUBING LAPAROSCOPIC (IRRIGATION / IRRIGATOR) IMPLANT
SET TUBE SMOKE EVAC HIGH FLOW (TUBING) ×2 IMPLANT
SHEARS HARMONIC ACE PLUS 36CM (ENDOMECHANICALS) IMPLANT
SHEARS HARMONIC HDI 36CM (ELECTROSURGICAL) ×2 IMPLANT
SLEEVE ENDOPATH XCEL 5M (ENDOMECHANICALS) IMPLANT
SUT MON AB 4-0 PC3 18 (SUTURE) ×2 IMPLANT
SUT NOVA NAB GS-21 0 18 T12 DT (SUTURE) ×2 IMPLANT
TOWEL GREEN STERILE (TOWEL DISPOSABLE) ×2 IMPLANT
TOWEL GREEN STERILE FF (TOWEL DISPOSABLE) ×2 IMPLANT
TRAY FOLEY W/BAG SLVR 16FR (SET/KITS/TRAYS/PACK)
TRAY FOLEY W/BAG SLVR 16FR ST (SET/KITS/TRAYS/PACK) IMPLANT
TRAY LAPAROSCOPIC MC (CUSTOM PROCEDURE TRAY) ×2 IMPLANT
TROCAR XCEL NON-BLD 11X100MML (ENDOMECHANICALS) ×2 IMPLANT
TROCAR XCEL NON-BLD 5MMX100MML (ENDOMECHANICALS) ×2 IMPLANT
WATER STERILE IRR 1000ML POUR (IV SOLUTION) ×2 IMPLANT

## 2021-01-07 NOTE — Anesthesia Procedure Notes (Signed)
Procedure Name: Intubation Date/Time: 01/07/2021 8:41 AM Performed by: Rande Brunt, CRNA Pre-anesthesia Checklist: Patient identified, Emergency Drugs available, Suction available and Patient being monitored Patient Re-evaluated:Patient Re-evaluated prior to induction Oxygen Delivery Method: Circle System Utilized Preoxygenation: Pre-oxygenation with 100% oxygen Induction Type: IV induction Ventilation: Mask ventilation without difficulty Laryngoscope Size: Mac and 3 Grade View: Grade I Tube type: Oral Tube size: 7.0 mm Number of attempts: 1 Airway Equipment and Method: Stylet Placement Confirmation: ETT inserted through vocal cords under direct vision,  positive ETCO2 and breath sounds checked- equal and bilateral Secured at: 22 cm Tube secured with: Tape Dental Injury: Teeth and Oropharynx as per pre-operative assessment

## 2021-01-07 NOTE — Transfer of Care (Signed)
Immediate Anesthesia Transfer of Care Note  Patient: Valerie Fisher  Procedure(s) Performed: LAPAROSCOPIC INCISIONAL HERNIA WITH MESH (N/A Abdomen)  Patient Location: PACU  Anesthesia Type:General  Level of Consciousness: awake, alert , drowsy and patient cooperative  Airway & Oxygen Therapy: Patient Spontanous Breathing and Patient connected to face mask oxygen  Post-op Assessment: Report given to RN, Post -op Vital signs reviewed and stable and Patient moving all extremities X 4  Post vital signs: Reviewed and stable  Last Vitals:  Vitals Value Taken Time  BP 119/94   Temp    Pulse 66 01/07/21 1036  Resp 15 01/07/21 1036  SpO2 98 % 01/07/21 1036  Vitals shown include unvalidated device data.  Last Pain:  Vitals:   01/07/21 4562  TempSrc: Oral         Complications: No complications documented.

## 2021-01-07 NOTE — Op Note (Signed)
LAPAROSCOPIC INCISIONAL HERNIA WITH MESH  Procedure Note  Valerie Fisher 01/07/2021   Pre-op Diagnosis: INCISIONAL HERNIA     Post-op Diagnosis: same  Procedure(s): LAPAROSCOPIC INCISIONAL HERNIA WITH MESH LYSIS OF ADHESIONS  Surgeon(s): [name], MD  Anesthesia: General  Staff:  Circulator: Seward Grater, RN Relief Circulator: Yves Dill D, RN Scrub Person: Ma Rings, RN; Garald Braver, RN; Deland Pretty, RN; Rico Ala  Estimated Blood Loss: Minimal               Findings: The patient was found to have a large incisional hernia containing small bowel and omentum.  It was repaired with a 20 cm x 25 cm piece of ventral light ST mesh from [facility]  Procedure: Patient brought to operating identifies correct patient.  She is placed upon the operating room table and general anesthesia was induced.  Her abdomen was prepped and draped in usual sterile fashion.  I made a small incision in the patient's left upper quadrant with a scalpel and then using the 5 mm trocar and Optiview camera slowly traversed all layers of the abdominal wall under direct vision and gained entrance to the peritoneal cavity.  Insufflation with carbon dioxide was then begun.  I evaluated the trocar site and saw no evidence of bowel injury.  The patient had extensive adhesions of omentum and small bowel to the abdominal wall and hernia which was situated at the lower midline and laterally.  I placed two 5 mm trochars in the patient's right upper quadrant and a 12 mm trocar in the patient's right lower quadrant under direct vision.  With the harmonic scalpel and the laparoscopic scissors I then lysed the adhesions of the omentum to the abdominal wall and was able to free up with sharp dissection the small bowel going into the hernia.  I examined the small bowel and saw no evidence of bowel injury.  I was able to fully visualize the entire fascial defect circumferentially.  There was a small  defect just above the umbilicus as well.  I measured the size of the defect.  Next a 20 x 25 cm oval piece of ventral light ST mesh was brought to the field.  I placed 4 separate #1 Novafil sutures into the corners of the mesh.  The mesh was then rolled up and soaked in saline.  I then introduced it under direct vision through the 12 mm trocar.  I then made for a stab incision with a scalpel and using the suture passer was able to pull all sutures up through the 4 quadrants of the abdominal wall staying widely around the fascial defect.  The 4 sutures were then tied in place pulling the mesh up to the peritoneal surface.  I then tacked the mesh and circumferentially with the absorbable tacker.  Wide, greater than 4 cm extension around the fascial defect with the mesh appeared to be achieved.  We again evaluated the small bowel and abdominal cavity and saw no evidence of bowel injury.  Hemostasis also appeared to be achieved.  All ports were then removed under direct vision and the abdomen was deflated.  All incisions were anesthetized with Marcaine and closed with 4-0 Monocryl sutures.  Dermabond was then applied.  The patient tolerated the procedure well.  All the counts were correct at the end of the procedure.  The patient was then extubated in the operating room and taken in stable condition to the recovery room.  [name]   Date: 01/07/2021  Time: 10:20 AM

## 2021-01-07 NOTE — Interval H&P Note (Signed)
History and Physical Interval Note: no change in H and P  01/07/2021 8:05 AM  Valerie Fisher  has presented today for surgery, with the diagnosis of INCISIONAL HERNIA.  The various methods of treatment have been discussed with the patient and family. After consideration of risks, benefits and other options for treatment, the patient has consented to  Procedure(s): Ada (N/A) as a surgical intervention.  The patient's history has been reviewed, patient examined, no change in status, stable for surgery.  I have reviewed the patient's chart and labs.  Questions were answered to the patient's satisfaction.     [name]

## 2021-01-07 NOTE — Plan of Care (Signed)

## 2021-01-07 NOTE — Anesthesia Preprocedure Evaluation (Addendum)
Anesthesia Evaluation  Patient identified by MRN, date of birth, ID band Patient awake    Reviewed: Allergy & Precautions, H&P , NPO status , Patient's Chart, lab work & pertinent test results  History of Anesthesia Complications (+) PONV and history of anesthetic complications  Airway Mallampati: II   Neck ROM: full    Dental   Pulmonary neg pulmonary ROS,    breath sounds clear to auscultation       Cardiovascular negative cardio ROS   Rhythm:regular Rate:Normal     Neuro/Psych Seizures -, Well Controlled,  PSYCHIATRIC DISORDERS Anxiety    GI/Hepatic GERD  ,  Endo/Other    Renal/GU      Musculoskeletal  (+) Arthritis ,   Abdominal   Peds  Hematology   Anesthesia Other Findings   Reproductive/Obstetrics H/o breast CA                             Anesthesia Physical Anesthesia Plan  ASA: II  Anesthesia Plan: General   Post-op Pain Management:    Induction: Intravenous  PONV Risk Score and Plan: 4 or greater and Dexamethasone, Midazolam, Treatment may vary due to age or medical condition and Propofol infusion  Airway Management Planned: Oral ETT  Additional Equipment:   Intra-op Plan:   Post-operative Plan: Extubation in OR  Informed Consent: I have reviewed the patients History and Physical, chart, labs and discussed the procedure including the risks, benefits and alternatives for the proposed anesthesia with the patient or authorized representative who has indicated his/her understanding and acceptance.     Dental advisory given  Plan Discussed with: CRNA, Anesthesiologist and Surgeon  Anesthesia Plan Comments:        Anesthesia Quick Evaluation

## 2021-01-08 ENCOUNTER — Encounter (HOSPITAL_COMMUNITY): Payer: Self-pay | Admitting: Surgery

## 2021-01-08 DIAGNOSIS — K432 Incisional hernia without obstruction or gangrene: Secondary | ICD-10-CM | POA: Diagnosis not present

## 2021-01-08 DIAGNOSIS — K660 Peritoneal adhesions (postprocedural) (postinfection): Secondary | ICD-10-CM | POA: Diagnosis not present

## 2021-01-08 LAB — BASIC METABOLIC PANEL
Anion gap: 7 (ref 5–15)
BUN: 9 mg/dL (ref 8–23)
CO2: 21 mmol/L — ABNORMAL LOW (ref 22–32)
Calcium: 8.6 mg/dL — ABNORMAL LOW (ref 8.9–10.3)
Chloride: 109 mmol/L (ref 98–111)
Creatinine, Ser: 0.68 mg/dL (ref 0.44–1.00)
GFR, Estimated: >60 mL/min (ref >60)
Glucose, Bld: 112 mg/dL — ABNORMAL HIGH (ref 70–99)
Potassium: 3.6 mmol/L (ref 3.5–5.1)
Sodium: 137 mmol/L (ref 135–145)

## 2021-01-08 LAB — CBC
HCT: 37.9 % (ref 36.0–46.0)
Hemoglobin: 12.4 g/dL (ref 12.0–15.0)
MCH: 29.3 pg (ref 26.0–34.0)
MCHC: 32.7 g/dL (ref 30.0–36.0)
MCV: 89.6 fL (ref 80.0–100.0)
Platelets: 260 10*3/uL (ref 150–400)
RBC: 4.23 MIL/uL (ref 3.87–5.11)
RDW: 12.9 % (ref 11.5–15.5)
WBC: 7.7 10*3/uL (ref 4.0–10.5)
nRBC: 0 % (ref 0.0–0.2)

## 2021-01-08 MED ORDER — TRAMADOL HCL 50 MG PO TABS: 50.0000 mg | ORAL_TABLET | Freq: Four times a day (QID) | ORAL | 0 refills | Status: DC | PRN

## 2021-01-08 NOTE — Discharge Instructions (Signed)
CC[facility] _______[facility]  UMBILICAL OR INGUINAL HERNIA REPAIR: POST OP INSTRUCTIONS  Always review your discharge instruction sheet given to you by the facility where your surgery was performed. IF YOU HAVE DISABILITY OR FAMILY LEAVE FORMS, YOU MUST BRING THEM TO THE OFFICE FOR PROCESSING.   DO NOT GIVE THEM TO YOUR DOCTOR.  1. A  prescription for pain medication may be given to you upon discharge.  Take your pain medication as prescribed, if needed.  If narcotic pain medicine is not needed, then you may take acetaminophen (Tylenol) or ibuprofen (Advil) as needed. 2. Take your usually prescribed medications unless otherwise directed. If you need a refill on your pain medication, please contact your pharmacy.  They will contact our office to request authorization. Prescriptions will not be filled after 5 pm or on week-ends. 3. You should follow a light diet the first 24 hours after arrival home, such as soup and crackers, etc.  Be sure to include lots of fluids daily.  Resume your normal diet the day after surgery. 4.Most patients will experience some swelling and bruising around the umbilicus or in the groin and scrotum.  Ice packs and reclining will help.  Swelling and bruising can take several days to resolve.  6. It is common to experience some constipation if taking pain medication after surgery.  Increasing fluid intake and taking a stool softener (such as Colace) will usually help or prevent this problem from occurring.  A mild laxative (Milk of Magnesia or Miralax) should be taken according to package directions if there are no bowel movements after 48 hours. 7. Unless discharge instructions indicate otherwise, you may remove your bandages 24-48 hours after surgery, and you may shower at that time.  You may have steri-strips (small skin tapes) in place directly over the incision.  These strips should be left on the skin for 7-10 days.  If your surgeon used skin glue on the  incision, you may shower in 24 hours.  The glue will flake off over the next 2-3 weeks.  Any sutures or staples will be removed at the office during your follow-up visit. 8. ACTIVITIES:  You may resume regular (light) daily activities beginning the next day--such as daily self-care, walking, climbing stairs--gradually increasing activities as tolerated.  You may have sexual intercourse when it is comfortable.  Refrain from any heavy lifting or straining until approved by your doctor.  a.You may drive when you are no longer taking prescription pain medication, you can comfortably wear a seatbelt, and you can safely maneuver your car and apply brakes. b.RETURN TO WORK:   _____________________________________________  9.You should see your doctor in the office for a follow-up appointment approximately 2-3 weeks after your surgery.  Make sure that you call for this appointment within a day or two after you arrive home to insure a convenient appointment time. 10.OTHER INSTRUCTIONS: __OK TO SHOWER TODAY NO LIFTING MORE THAN 15 TO 20 POUNDS FOR 6 WEEKS ICE PACK, EXTRA STRENGTH TYLENOL ALSO FOR PAIN_______________________    _____________________________________  WHEN TO CALL YOUR DOCTOR: 1. Fever over 101.0 2. Inability to urinate 3. Nausea and/or vomiting 4. Extreme swelling or bruising 5. Continued bleeding from incision. 6. Increased pain, redness, or drainage from the incision  The clinic staff is available to answer your questions during regular business hours.  Please don't hesitate to call and ask to speak to one of the nurses for clinical concerns.  If you have a medical emergency, go to the  nearest emergency room or call 911.  A surgeon from [facility] is always on call at the hospital   [address] [place], [place], [place]  [address] ?  [address], [place], [place]   [address] [phone] ? [phone] ? FAX [phone] Web site:  [email or web address]

## 2021-01-08 NOTE — Discharge Summary (Signed)
Physician Discharge Summary  Patient ID: Valerie Fisher MRN: [id number] DOB/AGE: [birth year] 72 y.o.  Admit date: 01/07/2021 Discharge date: 01/08/2021  Admission Diagnoses:  Discharge Diagnoses:  Active Problems:   Incisional hernia   Discharged Condition: good  Hospital Course: uneventful post op recovery.  Discharged POD#1 doing well  Consults: None  Significant Diagnostic Studies:   Treatments: surgery: laparoscopic incisional hernia repair with mesh   Discharge Exam: Blood pressure 97/69, pulse 71, temperature 98.2 F (36.8 C), resp. rate 17, height 5' (1.524 m), weight 58.1 kg, SpO2 93 %. General appearance: alert, cooperative and no distress Resp: clear to auscultation bilaterally Cardio: regular rate and rhythm, S1, S2 normal, no murmur, click, rub or gallop Incision/Wound:abdomen soft, minimally tender, incisions dry  Disposition: Discharge disposition: 01-Home or Self Care        Allergies as of 01/08/2021      Reactions   Aspirin Other (See Comments)   GI upset   Hydrocodone-acetaminophen Nausea And Vomiting   Hydromorphone Hcl Nausea And Vomiting   Morphine Nausea And Vomiting   Pseudoephedrine Hcl Nausea And Vomiting   Other Nausea Only   General anesthesia    Zofran [ondansetron] Other (See Comments)   Headache      Medication List    TAKE these medications   aspirin-acetaminophen-caffeine 250-250-65 MG tablet Commonly known as: EXCEDRIN MIGRAINE Take 1 tablet by mouth every 6 (six) hours as needed for headache.   fluticasone 50 MCG/ACT nasal spray Commonly known as: FLONASE Place 1 spray into both nostrils daily as needed for allergies or rhinitis.   levETIRAcetam 500 MG tablet Commonly known as: KEPPRA Take 500-1,000 mg by mouth See admin instructions. Take 500 mg in the morning and 1000 mg in the evening   Melatonin 5 MG Caps Take 5 mg by mouth at bedtime as needed (sleep).   pantoprazole 40 MG tablet Commonly known as:  PROTONIX Take 1 tablet (40 mg total) by mouth 2 (two) times daily before a meal.   Prolia 60 MG/ML Sosy injection Generic drug: denosumab Inject 60 mg into the skin every 6 (six) months.   sertraline 100 MG tablet Commonly known as: ZOLOFT Take 1 tablet (100 mg total) by mouth daily.   sucralfate 1 g tablet Commonly known as: Carafate Take 1 tablet (1 g total) by mouth 4 (four) times daily -  with meals and at bedtime.   traMADol 50 MG tablet Commonly known as: ULTRAM Take 1 tablet (50 mg total) by mouth every 6 (six) hours as needed.   zonisamide 100 MG capsule Commonly known as: ZONEGRAN Take 400 mg by mouth at bedtime.       Follow-up Information    [name], MD. Schedule an appointment as soon as possible for a visit in 4 week(s).   Specialty: General Surgery Contact information: [address] [address] [phone]               Signed: [name] 01/08/2021, 8:17 AM

## 2021-01-08 NOTE — Plan of Care (Signed)

## 2021-01-08 NOTE — Anesthesia Postprocedure Evaluation (Signed)
Anesthesia Post Note  Patient: Valerie Fisher  Procedure(s) Performed: LAPAROSCOPIC INCISIONAL HERNIA WITH MESH (N/A Abdomen)     Patient location during evaluation: PACU Anesthesia Type: General Level of consciousness: awake and alert Pain management: pain level controlled Vital Signs Assessment: post-procedure vital signs reviewed and stable Respiratory status: spontaneous breathing, nonlabored ventilation, respiratory function stable and patient connected to nasal cannula oxygen Cardiovascular status: blood pressure returned to baseline and stable Postop Assessment: no apparent nausea or vomiting Anesthetic complications: no   No complications documented.  Last Vitals:  Vitals:   01/08/21 0134 01/08/21 0534  BP: 95/64 97/69  Pulse: 76 71  Resp: 16 17  Temp:  36.8 C  SpO2: 94% 93%    Last Pain:  Vitals:   01/08/21 0419  TempSrc:   PainSc: [place]

## 2021-01-08 NOTE — Plan of Care (Signed)
Problem: Education: Goal: Knowledge of General Education information will improve Description: Including pain rating scale, medication(s)/side effects and non-pharmacologic comfort measures 01/08/2021 1013 by Camillia Herter, RN Outcome: Adequate for Discharge 01/08/2021 1013 by Camillia Herter, RN Outcome: Progressing 01/08/2021 1011 by Camillia Herter, RN Outcome: Progressing   Problem: Health Behavior/Discharge Planning: Goal: Ability to manage health-related needs will improve 01/08/2021 1013 by Camillia Herter, RN Outcome: Adequate for Discharge 01/08/2021 1013 by Camillia Herter, RN Outcome: Progressing 01/08/2021 1011 by Camillia Herter, RN Outcome: Progressing   Problem: Clinical Measurements: Goal: Ability to maintain clinical measurements within normal limits will improve 01/08/2021 1013 by Camillia Herter, RN Outcome: Adequate for Discharge 01/08/2021 1013 by Camillia Herter, RN Outcome: Progressing 01/08/2021 1011 by Camillia Herter, RN Outcome: Progressing Goal: Will remain free from infection 01/08/2021 1013 by Camillia Herter, RN Outcome: Adequate for Discharge 01/08/2021 1013 by Camillia Herter, RN Outcome: Progressing 01/08/2021 1011 by Camillia Herter, RN Outcome: Progressing Goal: Diagnostic test results will improve 01/08/2021 1013 by Camillia Herter, RN Outcome: Adequate for Discharge 01/08/2021 1013 by Camillia Herter, RN Outcome: Progressing 01/08/2021 1011 by Camillia Herter, RN Outcome: Progressing Goal: Respiratory complications will improve 01/08/2021 1013 by Camillia Herter, RN Outcome: Adequate for Discharge 01/08/2021 1013 by Camillia Herter, RN Outcome: Progressing 01/08/2021 1011 by Julius Bowels A, RN Outcome: Progressing Goal: Cardiovascular complication will be avoided 01/08/2021 1013 by Camillia Herter, RN Outcome: Adequate for Discharge 01/08/2021 1013 by Camillia Herter, RN Outcome: Progressing 01/08/2021 1011 by Camillia Herter, RN Outcome: Progressing   Problem: Activity: Goal: Risk for activity intolerance will decrease 01/08/2021 1013 by Camillia Herter, RN Outcome: Adequate for Discharge 01/08/2021 1013 by Camillia Herter, RN Outcome: Progressing 01/08/2021 1011 by Camillia Herter, RN Outcome: Progressing   Problem: Nutrition: Goal: Adequate nutrition will be maintained 01/08/2021 1013 by Camillia Herter, RN Outcome: Adequate for Discharge 01/08/2021 1013 by Camillia Herter, RN Outcome: Progressing 01/08/2021 1011 by Camillia Herter, RN Outcome: Progressing   Problem: Coping: Goal: Level of anxiety will decrease 01/08/2021 1013 by Camillia Herter, RN Outcome: Adequate for Discharge 01/08/2021 1013 by Camillia Herter, RN Outcome: Progressing 01/08/2021 1011 by Camillia Herter, RN Outcome: Progressing   Problem: Elimination: Goal: Will not experience complications related to bowel motility 01/08/2021 1013 by Camillia Herter, RN Outcome: Adequate for Discharge 01/08/2021 1013 by Camillia Herter, RN Outcome: Progressing 01/08/2021 1011 by Camillia Herter, RN Outcome: Progressing Goal: Will not experience complications related to urinary retention 01/08/2021 1013 by Camillia Herter, RN Outcome: Adequate for Discharge 01/08/2021 1013 by Camillia Herter, RN Outcome: Progressing 01/08/2021 1011 by Camillia Herter, RN Outcome: Progressing   Problem: Pain Managment: Goal: General experience of comfort will improve 01/08/2021 1013 by Camillia Herter, RN Outcome: Adequate for Discharge 01/08/2021 1013 by Camillia Herter, RN Outcome: Progressing 01/08/2021 1011 by Julius Bowels A, RN Outcome: Progressing   Problem: Safety: Goal: Ability to remain free from injury will improve 01/08/2021 1013 by Camillia Herter, RN Outcome: Adequate for Discharge 01/08/2021 1013 by Camillia Herter, RN Outcome: Progressing 01/08/2021 1011 by Julius Bowels A, RN Outcome: Progressing   Problem:  Skin Integrity: Goal: Risk for impaired skin integrity will decrease 01/08/2021 1013 by Camillia Herter, RN Outcome: Adequate for Discharge 01/08/2021 1013 by Camillia Herter, RN Outcome: Progressing 01/08/2021 1011 by Camillia Herter, RN  Outcome: Progressing   

## 2021-01-08 NOTE — Progress Notes (Signed)
Valerie Fisher to be D/C'd  per MD order. Discussed with the patient and all questions fully answered.  VSS, Skin clean, dry and intact without evidence of skin break down, no evidence of skin tears noted.  IV catheter discontinued intact. Site without signs and symptoms of complications. Dressing and pressure applied.  An After Visit Summary was printed and given to the patient. Patient received prescription.  D/c education completed with patient/family including follow up instructions, medication list, d/c activities limitations if indicated, with other d/c instructions as indicated by MD - patient able to verbalize understanding, all questions fully answered.   Patient instructed to return to ED, call 911, or call MD for any changes in condition.   Patient to be escorted via [place], and D/C home via private auto.

## 2021-02-04 ENCOUNTER — Ambulatory Visit: Payer: Medicare Other

## 2021-02-10 ENCOUNTER — Ambulatory Visit: Payer: Medicare Other

## 2021-02-17 ENCOUNTER — Other Ambulatory Visit: Payer: Self-pay

## 2021-02-17 ENCOUNTER — Ambulatory Visit (INDEPENDENT_AMBULATORY_CARE_PROVIDER_SITE_OTHER): Payer: Medicare Other | Admitting: Pharmacist

## 2021-02-17 DIAGNOSIS — R1013 Epigastric pain: Secondary | ICD-10-CM

## 2021-02-17 DIAGNOSIS — M810 Age-related osteoporosis without current pathological fracture: Secondary | ICD-10-CM | POA: Diagnosis not present

## 2021-02-17 DIAGNOSIS — G40A09 Absence epileptic syndrome, not intractable, without status epilepticus: Secondary | ICD-10-CM

## 2021-02-17 DIAGNOSIS — F411 Generalized anxiety disorder: Secondary | ICD-10-CM

## 2021-02-17 NOTE — Progress Notes (Signed)
Chronic Care Management Pharmacy Note  02/18/2021 Name:  Valerie Fisher MRN:  [id number] DOB:  [birth year]  Summary: addressed mental health, osteoporosis, epilepsy, epigastric pain. Patient is not taking carafate or pantoprazole d/t stated side effect of stomach upset, pt was advised by specialist can hold off & see if symptoms resolve, pending more GI imaging.  Recommendations/Changes made from today's visit: none  Plan: f/u with pharmacist in 3 months to assess medication side effects/outcome of GI imaging as well as chronic conditions   Subjective: Valerie Fisher is an 70 y.o. year old female who is a primary patient of Metheney, Rene Kocher, MD.  The CCM team was consulted for assistance with disease management and care coordination needs.    Engaged with patient by telephone for initial visit in response to provider referral for pharmacy case management and/or care coordination services.   Consent to Services:  The patient was given information about Chronic Care Management services, agreed to services, and gave verbal consent prior to initiation of services.  Please see initial visit note for detailed documentation.   Patient Care Team: Hali Marry, MD as PCP - General (Family Medicine) Darius Bump, [facility] as Pharmacist (Pharmacist)   Objective:  Lab Results  Component Value Date   CREATININE 0.68 01/08/2021   CREATININE 0.80 01/07/2021   CREATININE 0.88 09/22/2020       Component Value Date/Time   CHOL 246 (H) 09/12/2019 1321   TRIG 138 09/12/2019 1321   HDL 88 09/12/2019 1321   CHOLHDL 2.8 09/12/2019 1321   CHOLHDL 2.9 09/07/2019 1434   LDLCALC 134 (H) 09/12/2019 1321   LDLCALC 123 (H) 09/07/2019 1434   LDLCALC CANCELED 09/07/2019 1434    Hepatic Function Latest Ref Rng & Units 01/07/2021 09/22/2020 03/14/2020  Total Protein 6.5 - 8.1 g/dL 6.7 7.2 7.0  Albumin 3.5 - 5.0 g/dL 4.0 4.8(H) 4.6  AST 15 - 41 U/L _0 ALT 0 - 44 U/L _1 Alk  Phosphatase 38 - 126 U/L 43 58 56  Total Bilirubin 0.3 - 1.2 mg/dL 1.0 0.4 0.7    Lab Results  Component Value Date/Time   TSH 1.650 09/12/2019 01:21 PM    CBC Latest Ref Rng & Units 01/08/2021 01/07/2021 09/22/2020  WBC 4.0 - 10.5 K/uL 7.7 5.2 5.1  Hemoglobin 12.0 - 15.0 g/dL 12.4 14.7 15.2  Hematocrit 36.0 - 46.0 % 37.9 45.3 45.6  Platelets 150 - 400 K/uL 260 302 327    Social History   Tobacco Use  Smoking Status Never  Smokeless Tobacco Never   BP Readings from Last 3 Encounters:  01/08/21 97/69  12/15/20 122/81  10/24/20 102/62   Pulse Readings from Last 3 Encounters:  01/08/21 71  12/15/20 72  10/24/20 69   Wt Readings from Last 3 Encounters:  01/07/21 128 lb (58.1 kg)  12/15/20 131 lb (59.4 kg)  11/10/20 134 lb (60.8 kg)    Assessment: Review of patient past medical history, allergies, medications, health status, including review of consultants reports, laboratory and other test data, was performed as part of comprehensive evaluation and provision of chronic care management services.   SDOH:  (Social Determinants of Health) assessments and interventions performed:    CCM Care Plan  Allergies  Allergen Reactions   Aspirin Other (See Comments)    GI upset    Hydrocodone-Acetaminophen Nausea And Vomiting   Hydromorphone Hcl Nausea And Vomiting   Morphine Nausea And Vomiting   Pseudoephedrine Hcl Nausea And  Vomiting   Other Nausea Only    General anesthesia    Zofran [Ondansetron] Other (See Comments)    Headache    Medications Reviewed Today     Reviewed by Darius Bump, [facility] (Pharmacist) on 02/17/21 at [place] List Status: <None>   Medication Order Taking? Sig Documenting Provider Last Dose Status Informant  aspirin-acetaminophen-caffeine (EXCEDRIN MIGRAINE) 132-440-10 MG tablet [id number] Yes Take 1 tablet by mouth every 6 (six) hours as needed for headache. [provider] Taking Active Self  Calcium Carbonate-Vit D-Min (CALCIUM 1200  PO) [id number] Yes Take 1 tablet by mouth daily. [provider] Taking Active   Cholecalciferol (VITAMIN D) 125 MCG (5000 UT) CAPS [id number] Yes Take 5,000 Units by mouth daily. [provider] Taking Active   denosumab (PROLIA) 60 MG/ML SOSY injection [id number] Yes Inject 60 mg into the skin every 6 (six) months. [provider] Taking Active Self           Med Note Darra Lis Feb 17, 2021  1:53 PM) Due August 2022  fluticasone [facility]) 50 MCG/ACT nasal spray [id number] Yes Place 1 spray into both nostrils daily as needed for allergies or rhinitis. [provider] Taking Active Self  levETIRAcetam (KEPPRA) 500 MG tablet [id number] Yes Take 500-1,000 mg by mouth See admin instructions. Take 500 mg in the morning and 1000 mg in the evening Merlene Morse, MD Taking Active Self           Med Note Darius Bump   Tue Feb 17, 2021  1:54 PM) Plan to wean off keppra fall 2022  Melatonin 5 MG CAPS [id number] Yes Take 5 mg by mouth at bedtime as needed (sleep). [provider] Taking Active Self  Multiple Vitamin (MULTIVITAMIN ADULT PO) [id number] Yes Take 2 each by mouth daily. [provider] Taking Active            Med Note Darius Bump   Tue Feb 17, 2021  2:01 PM) Vitafusion gummies  Multiple Vitamins-Minerals (PRESERVISION AREDS 2+MULTI VIT PO) [id number] Yes Take 1 tablet by mouth daily. [provider] Taking Active   pantoprazole (PROTONIX) 40 MG tablet [id number] No Take 1 tablet (40 mg total) by mouth 2 (two) times daily before a meal.  Patient not taking: Reported on 02/17/2021   Hali Marry, MD Not Taking Active Self  sertraline (ZOLOFT) 100 MG tablet [id number] Yes Take 1 tablet (100 mg total) by mouth daily. Hali Marry, MD Taking Active Self  sucralfate (CARAFATE) 1 g tablet [id number] No Take 1 tablet (1 g total) by mouth 4 (four) times daily -  with meals and at bedtime.  Patient not taking: Reported  on 02/17/2021   Hali Marry, MD Not Taking Active Self           Med Note Darius Bump   Tue Feb 17, 2021  1:56 PM)    traMADol (ULTRAM) 50 MG tablet [id number] No Take 1 tablet (50 mg total) by mouth every 6 (six) hours as needed.  Patient not taking: Reported on 02/17/2021   [name], MD Not Taking Active   zonisamide [facility]) 100 MG capsule [id number] Yes Take 400 mg by mouth at bedtime. Merlene Morse, MD Taking Active Self            Patient Active Problem List   Diagnosis Date Noted   Incisional hernia 01/07/2021   Fatty liver 10/07/2020   Calculus of  gallbladder without cholecystitis without obstruction 10/07/2020   Insomnia 05/07/2019   Right leg pain 05/07/2019   GAD (generalized anxiety disorder) 03/05/2019   History of right breast cancer 03/05/2019   H/O colectomy 02/15/2019   DDD (degenerative disc disease) 01/18/2019   History of diverticular abscess of colon 12/12/2017   Auditory complaints of both ears 04/04/2016   Osteoporosis 03/28/2015   Therapeutic drug monitoring 01/24/2014   Absence epilepsy, not intractable, without status epilepticus ([place]) 05/15/2013   Generalized nonconvulsive epilepsy ([place]) 05/15/2013   Rotator cuff tendonitis 04/23/2013   Internal hemorrhoids 03/30/2013    Immunization History  Administered Date(s) Administered   Fluad Quad(high Dose 65+) 05/02/2019, 05/30/2020   Influenza, High Dose Seasonal PF 06/03/2018   Influenza, Seasonal, Injecte, Preservative Fre 05/17/2015   PFIZER(Purple Top)SARS-COV-2 Vaccination 09/16/2019, 10/14/2019, 11/07/2019   Pneumococcal Conjugate-13 08/26/2014   Pneumococcal Polysaccharide-23 09/08/2015   Tdap 07/26/2008, 08/04/2018   Zoster, Live 08/15/2014    Conditions to be addressed/monitored: Anxiety, osteoporosis, epilepsy  Care Plan : Medication Management  Updates made by Darius Bump, [place] since 02/18/2021 12:00 AM     Problem: Depression/Anxiety, Osteoporosis, Epilepsy,  Epigastric Pain      Long-Range Goal: Disease Progression Prevention   Start Date: 02/17/2021  This Visit's Progress: On track  Priority: High  Note:   Current Barriers:  None at present Pt does report not taking carafate or pantoprazole d/t suspected side effects, advised by specialist can hold off on these for now to see if resolves  Pharmacist Clinical Goal(s):  Over the next 90 days, patient will maintain control of chronic conditions as evidenced by medication fill history, lab values, and vital signs  through collaboration with PharmD and provider.   Interventions: 1:1 collaboration with Hali Marry, MD regarding development and update of comprehensive plan of care as evidenced by provider attestation and co-signature Inter-disciplinary care team collaboration (see longitudinal plan of care) Comprehensive medication review performed; medication list updated in electronic medical record  Depression/Anxiety:  Controlled; current treatment:sertraline 11m daily;   Recommended continue current regimen,  Osteoporosis:  Current treatment:prolia Q683month   Last DEXA: 08/24/2019, t score -2.5  Supplementation: calcium carbonate 120018m vit D 5000IU daily Epilepsy  Controlled; current treatment:keppra 500m65m, 1000mg35m and zonisamide 400mg 67medtime;   Managed by neurology, titrating off keprra d/t patient reports of feeling foggy/spacey   Recommended continue current regimen, Epigastric Pain  Controlled; current treatment:carafate and pantoprazole (though patient not currently taking d/t side effects, pt reports was advised by specialist can trial off the meds to see if resolves;   Pending further GI imaging, upcoming visit w/GI 03/09/21  Recommended continue current regimen  Patient Goals/Self-Care Activities Over the next 90 days, patient will:  take medications as prescribed  Follow Up Plan: Telephone follow up appointment with care management team member  scheduled for:  3 months      Medication Assistance: None required.  Patient affirms current coverage meets needs.  Patient's preferred pharmacy is:  [facility] [address] [place]: [phone] Mail [facility]  ([address] [place] [place]: [phone] pill box? Yes Pt endorses 100% compliance  Follow Up:  Patient agrees to Care Plan and Follow-up.  Plan: Telephone follow up appointment with care management  team member scheduled for:  3 months  Darius Bump

## 2021-02-18 NOTE — Patient Instructions (Signed)
Visit Information   PATIENT GOALS:   Goals Addressed             This Visit's Progress    Medication Management       Patient Goals/Self-Care Activities Over the next 90 days, patient will:  take medications as prescribed  Follow Up Plan: Telephone follow up appointment with care management team member scheduled for:  3 months          Consent to CCM Services: Valerie Fisher was given information about Chronic Care Management services today including:  CCM service includes personalized support from designated clinical staff supervised by her physician, including individualized plan of care and coordination with other care providers 24/7 contact phone numbers for assistance for urgent and routine care needs. Service will only be billed when office clinical staff spend 20 minutes or more in a month to coordinate care. Only one practitioner may furnish and bill the service in a calendar month. The patient may stop CCM services at any time (effective at the end of the month) by phone call to the office staff. The patient will be responsible for cost sharing (co-pay) of up to 20% of the service fee (after annual deductible is met).  Patient agreed to services and verbal consent obtained.   Patient verbalizes understanding of instructions provided today and agrees to view in [place].   Telephone follow up appointment with care management team member scheduled for: 3 months  Valerie Fisher   CLINICAL CARE PLAN: Patient Care Plan: Medication Management     Problem Identified: Depression/Anxiety, Osteoporosis, Epilepsy, Epigastric Pain      Long-Range Goal: Disease Progression Prevention   Start Date: 02/17/2021  This Visit's Progress: On track  Priority: High  Note:   Current Barriers:  None at present Pt does report not taking carafate or pantoprazole d/t suspected side effects, advised by specialist can hold off on these for now to see if resolves  Pharmacist Clinical  Goal(s):  Over the next 90 days, patient will maintain control of chronic conditions as evidenced by medication fill history, lab values, and vital signs  through collaboration with PharmD and provider.   Interventions: 1:1 collaboration with Hali Marry, MD regarding development and update of comprehensive plan of care as evidenced by provider attestation and co-signature Inter-disciplinary care team collaboration (see longitudinal plan of care) Comprehensive medication review performed; medication list updated in electronic medical record  Depression/Anxiety:  Controlled; current treatment:sertraline 100mg  daily;   Recommended continue current regimen,  Osteoporosis:  Current treatment:prolia Q43months;   Last DEXA: 08/24/2019, t score -2.5  Supplementation: calcium carbonate 1200mg  + vit D 5000IU daily Epilepsy  Controlled; current treatment:keppra 500mg  AM, 1000mg  PM, and zonisamide 400mg  at bedtime;   Managed by neurology, titrating off keprra d/t patient reports of feeling foggy/spacey   Recommended continue current regimen, Epigastric Pain  Controlled; current treatment:carafate and pantoprazole (though patient not currently taking d/t side effects, pt reports was advised by specialist can trial off the meds to see if resolves;   Pending further GI imaging, upcoming visit w/GI 03/09/21  Recommended continue current regimen  Patient Goals/Self-Care Activities Over the next 90 days, patient will:  take medications as prescribed  Follow Up Plan: Telephone follow up appointment with care management team member scheduled for:  3 months

## 2021-02-26 ENCOUNTER — Encounter: Payer: Self-pay | Admitting: Family Medicine

## 2021-02-26 ENCOUNTER — Ambulatory Visit (INDEPENDENT_AMBULATORY_CARE_PROVIDER_SITE_OTHER): Payer: Medicare Other | Admitting: Family Medicine

## 2021-02-26 ENCOUNTER — Other Ambulatory Visit: Payer: Self-pay

## 2021-02-26 VITALS — BP 106/72 | HR 83 | Temp 98.4°F | Ht 60.0 in | Wt 126.0 lb

## 2021-02-26 DIAGNOSIS — S39012A Strain of muscle, fascia and tendon of lower back, initial encounter: Secondary | ICD-10-CM

## 2021-02-26 MED ORDER — MELOXICAM 15 MG PO TABS: 15.0000 mg | ORAL_TABLET | Freq: Every day | ORAL | 0 refills | Status: DC

## 2021-02-26 MED ORDER — KETOROLAC TROMETHAMINE 60 MG/2ML IM SOLN
60.0000 mg | Freq: Once | INTRAMUSCULAR | Status: AC
  Administered 2021-02-26: 60 mg via INTRAMUSCULAR

## 2021-02-26 NOTE — Progress Notes (Signed)
Acute Office Visit  Subjective:    Patient ID: Valerie Fisher, female    DOB: 02/14/1951, 70 y.o.   MRN: [id number]  Chief Complaint  Patient presents with   Back Pain    HPI Patient is in today for left lower back pain.  Patient states she was cleaning her garage about 5 days ago and recalls twisting and lifting a lot.  Now her left mid back hurts all the way around to her side.  She denies any rashes, numbness or tingling, changes to gait or balance, radiation of pain.  She reports it hurts to roll over in bed, twist left or right, bend over, push up to get out of a chair, sit for long periods of time.  Reports the most comfortable position is standing.  Pain can be as high as 9 out of 10 with these movements.  She has been able to sleep fine at night.  She has found minimal improvement with ibuprofen.    Past Medical History:  Diagnosis Date   Cancer [facility])    Breast Cancer 2010 right   Fatty liver    GERD (gastroesophageal reflux disease)    History of diverticular abscess of colon 12/12/2017   PONV (postoperative nausea and vomiting)    also slow to wake up   Seizures ([place])    last grand mal seizure was 14 years ago (as of 01/05/21)    Past Surgical History:  Procedure Laterality Date   bladder lift     COLOSTOMY     COLOSTOMY REVERSAL  02/2019   INCISIONAL HERNIA REPAIR N/A 01/07/2021   Procedure: LAPAROSCOPIC [place];  Surgeon: [name], MD;  Location: [place];  Service: General;  Laterality: N/A;    Family History  Problem Relation Age of Onset   Diabetes Father    Heart attack Father        mid 7s    Social History   Socioeconomic History   Marital status: Married    Spouse name: BJ   Number of children: 3   Years of education: 20   Highest education level: Master's degree (e.g., MA, MS, MEng, MEd, MSW, MBA)  Occupational History   Occupation: retired Pharmacist, hospital  Tobacco Use   Smoking status: Never   Smokeless tobacco: Never   Vaping Use   Vaping Use: Never used  Substance and Sexual Activity   Alcohol use: Not Currently    Alcohol/week: 0.0 standard drinks   Drug use: Never   Sexual activity: Yes    Partners: Male    Birth control/protection: None  Other Topics Concern   Not on file  Social History Narrative   Lives with her husband. They have three children. They have one dog. In her free time, she enjoys cooking and exercising.    Social Determinants of Health   Financial Resource Strain: Low Risk    Difficulty of Paying Living Expenses: Not hard at all  Food Insecurity: No Food Insecurity   Worried About Charity fundraiser in the Last Year: Never true   [place] in the Last Year: Never true  Transportation Needs: No Transportation Needs   Lack of Transportation (Medical): No   Lack of Transportation (Non-Medical): No  Physical Activity: Sufficiently Active   Days of Exercise per Week: 5 days   Minutes of Exercise per Session: 30 min  Stress: No Stress Concern Present   Feeling of Stress : Not at all  Social Connections:  Moderately Isolated   Frequency of Communication with Friends and Family: More than three times a week   Frequency of Social Gatherings with Friends and Family: Once a week   Attends Religious Services: Never   Marine scientist or Organizations: No   Attends Music therapist: Never   Marital Status: Married  Human resources officer Violence: Not At Risk   Fear of Current or Ex-Partner: No   Emotionally Abused: No   Physically Abused: No   Sexually Abused: No    Outpatient Medications Prior to Visit  Medication Sig Dispense Refill   aspirin-acetaminophen-caffeine (EXCEDRIN MIGRAINE) 250-250-65 MG tablet Take 1 tablet by mouth every 6 (six) hours as needed for headache.     Calcium Carbonate-Vit D-Min (CALCIUM 1200 PO) Take 1 tablet by mouth daily.     Cholecalciferol (VITAMIN D) 125 MCG (5000 UT) CAPS Take 5,000 Units by mouth daily.     denosumab  (PROLIA) 60 MG/ML SOSY injection Inject 60 mg into the skin every 6 (six) months.     fluticasone (FLONASE) 50 MCG/ACT nasal spray Place 1 spray into both nostrils daily as needed for allergies or rhinitis.     levETIRAcetam (KEPPRA) 500 MG tablet Take 500-1,000 mg by mouth See admin instructions. Take 500 mg in the morning and 1000 mg in the evening  3   Melatonin 5 MG CAPS Take 5 mg by mouth at bedtime as needed (sleep).     Multiple Vitamin (MULTIVITAMIN ADULT PO) Take 2 each by mouth daily.     Multiple Vitamins-Minerals (PRESERVISION AREDS 2+MULTI VIT PO) Take 1 tablet by mouth daily.     pantoprazole (PROTONIX) 40 MG tablet Take 1 tablet (40 mg total) by mouth 2 (two) times daily before a meal. 60 tablet 3   sertraline (ZOLOFT) 100 MG tablet Take 1 tablet (100 mg total) by mouth daily. 90 tablet 1   sucralfate (CARAFATE) 1 g tablet Take 1 tablet (1 g total) by mouth 4 (four) times daily -  with meals and at bedtime. 120 tablet 1   traMADol (ULTRAM) 50 MG tablet Take 1 tablet (50 mg total) by mouth every 6 (six) hours as needed. 25 tablet 0   zonisamide (ZONEGRAN) 100 MG capsule Take 400 mg by mouth at bedtime.  1   No facility-administered medications prior to visit.    Allergies  Allergen Reactions   Aspirin Other (See Comments)    GI upset    Hydrocodone-Acetaminophen Nausea And Vomiting   Hydromorphone Hcl Nausea And Vomiting   Morphine Nausea And Vomiting   Pseudoephedrine Hcl Nausea And Vomiting   Other Nausea Only    General anesthesia    Zofran [Ondansetron] Other (See Comments)    Headache    Review of Systems All review of systems negative except what is listed in the HPI     Objective:    Physical Exam Vitals reviewed.  Constitutional:      Appearance: Normal appearance.  Musculoskeletal:        General: No swelling or deformity.     Cervical back: Normal range of motion.     Comments: Left lower back: full range of motion, but pain with twisting/bending, no  increasing pain on palpation, no erythema, rashes, ecchymosis, edema  Skin:    General: Skin is warm and dry.     Capillary Refill: Capillary refill takes less than 2 seconds.     Findings: No rash.  Neurological:     General: No focal  deficit present.     Mental Status: She is alert and oriented to person, place, and time. Mental status is at baseline.  Psychiatric:        Mood and Affect: Mood normal.        Behavior: Behavior normal.        Thought Content: Thought content normal.        Judgment: Judgment normal.    BP 106/72   Pulse 83   Temp 98.4 F (36.9 C)   Ht 5' (1.524 m)   Wt 126 lb (57.2 kg)   SpO2 99%   BMI 24.61 kg/m  Wt Readings from Last 3 Encounters:  02/26/21 126 lb (57.2 kg)  01/07/21 128 lb (58.1 kg)  12/15/20 131 lb (59.4 kg)    Health Maintenance Due  Topic Date Due   Zoster Vaccines- Shingrix (1 of 2) Never done    There are no preventive care reminders to display for this patient.   Lab Results  Component Value Date   TSH 1.650 09/12/2019   Lab Results  Component Value Date   WBC 7.7 01/08/2021   HGB 12.4 01/08/2021   HCT 37.9 01/08/2021   MCV 89.6 01/08/2021   PLT 260 01/08/2021   Lab Results  Component Value Date   NA 137 01/08/2021   K 3.6 01/08/2021   CO2 21 (L) 01/08/2021   GLUCOSE 112 (H) 01/08/2021   BUN 9 01/08/2021   CREATININE 0.68 01/08/2021   BILITOT 1.0 01/07/2021   ALKPHOS 43 01/07/2021   AST 16 01/07/2021   ALT 11 01/07/2021   PROT 6.7 01/07/2021   ALBUMIN 4.0 01/07/2021   CALCIUM 8.6 (L) 01/08/2021   ANIONGAP 7 01/08/2021   Lab Results  Component Value Date   CHOL 246 (H) 09/12/2019   Lab Results  Component Value Date   HDL 88 09/12/2019   Lab Results  Component Value Date   LDLCALC 134 (H) 09/12/2019   Lab Results  Component Value Date   TRIG 138 09/12/2019   Lab Results  Component Value Date   CHOLHDL 2.8 09/12/2019   No results found for: HGBA1C     Assessment & Plan:   1. Strain of  lumbar region, initial encounter Giving Toradol injection in office.  Starting tomorrow patient can take meloxicam daily for the next couple of weeks.  Also encouraged heating pad, massage, gentle stretching (handout with exercises provided).  Patient aware that she should not take any other NSAIDs while on meloxicam, Tylenol would be fine. Patient aware of signs/symptoms requiring further/urgent evaluation.  - ketorolac (TORADOL) injection 60 mg - meloxicam (MOBIC) 15 MG tablet; Take 1 tablet (15 mg total) by mouth daily.  Dispense: 30 tablet; Refill: 0  Follow-up in 4 to 6 weeks if not improving or sooner if needed.  Purcell Nails Olevia Bowens, DNP, FNP-C

## 2021-03-05 ENCOUNTER — Ambulatory Visit (INDEPENDENT_AMBULATORY_CARE_PROVIDER_SITE_OTHER): Payer: Medicare Other | Admitting: Family Medicine

## 2021-03-05 ENCOUNTER — Other Ambulatory Visit: Payer: Self-pay | Admitting: Family Medicine

## 2021-03-05 ENCOUNTER — Other Ambulatory Visit: Payer: Self-pay

## 2021-03-05 ENCOUNTER — Ambulatory Visit (INDEPENDENT_AMBULATORY_CARE_PROVIDER_SITE_OTHER): Payer: Medicare Other

## 2021-03-05 ENCOUNTER — Encounter: Payer: Self-pay | Admitting: Family Medicine

## 2021-03-05 VITALS — BP 109/71 | HR 87 | Ht 60.0 in | Wt 124.1 lb

## 2021-03-05 DIAGNOSIS — M549 Dorsalgia, unspecified: Secondary | ICD-10-CM

## 2021-03-05 DIAGNOSIS — M5137 Other intervertebral disc degeneration, lumbosacral region: Secondary | ICD-10-CM | POA: Diagnosis not present

## 2021-03-05 DIAGNOSIS — R921 Mammographic calcification found on diagnostic imaging of breast: Secondary | ICD-10-CM | POA: Diagnosis not present

## 2021-03-05 DIAGNOSIS — M47816 Spondylosis without myelopathy or radiculopathy, lumbar region: Secondary | ICD-10-CM | POA: Diagnosis not present

## 2021-03-05 DIAGNOSIS — S22000A Wedge compression fracture of unspecified thoracic vertebra, initial encounter for closed fracture: Secondary | ICD-10-CM

## 2021-03-05 DIAGNOSIS — S22070A Wedge compression fracture of T9-T10 vertebra, initial encounter for closed fracture: Secondary | ICD-10-CM | POA: Diagnosis not present

## 2021-03-05 DIAGNOSIS — M4696 Unspecified inflammatory spondylopathy, lumbar region: Secondary | ICD-10-CM | POA: Diagnosis not present

## 2021-03-05 DIAGNOSIS — S3992XA Unspecified injury of lower back, initial encounter: Secondary | ICD-10-CM | POA: Diagnosis not present

## 2021-03-05 IMAGING — DX DG THORACIC SPINE 3V
3 series · 3 of 3 positions shown · non-contrast
Comparison: Chest radiograph [DATE]

CLINICAL DATA: mid back pain, not improving

EXAM:
THORACIC SPINE - 3 VIEWS

[t-spine ap]
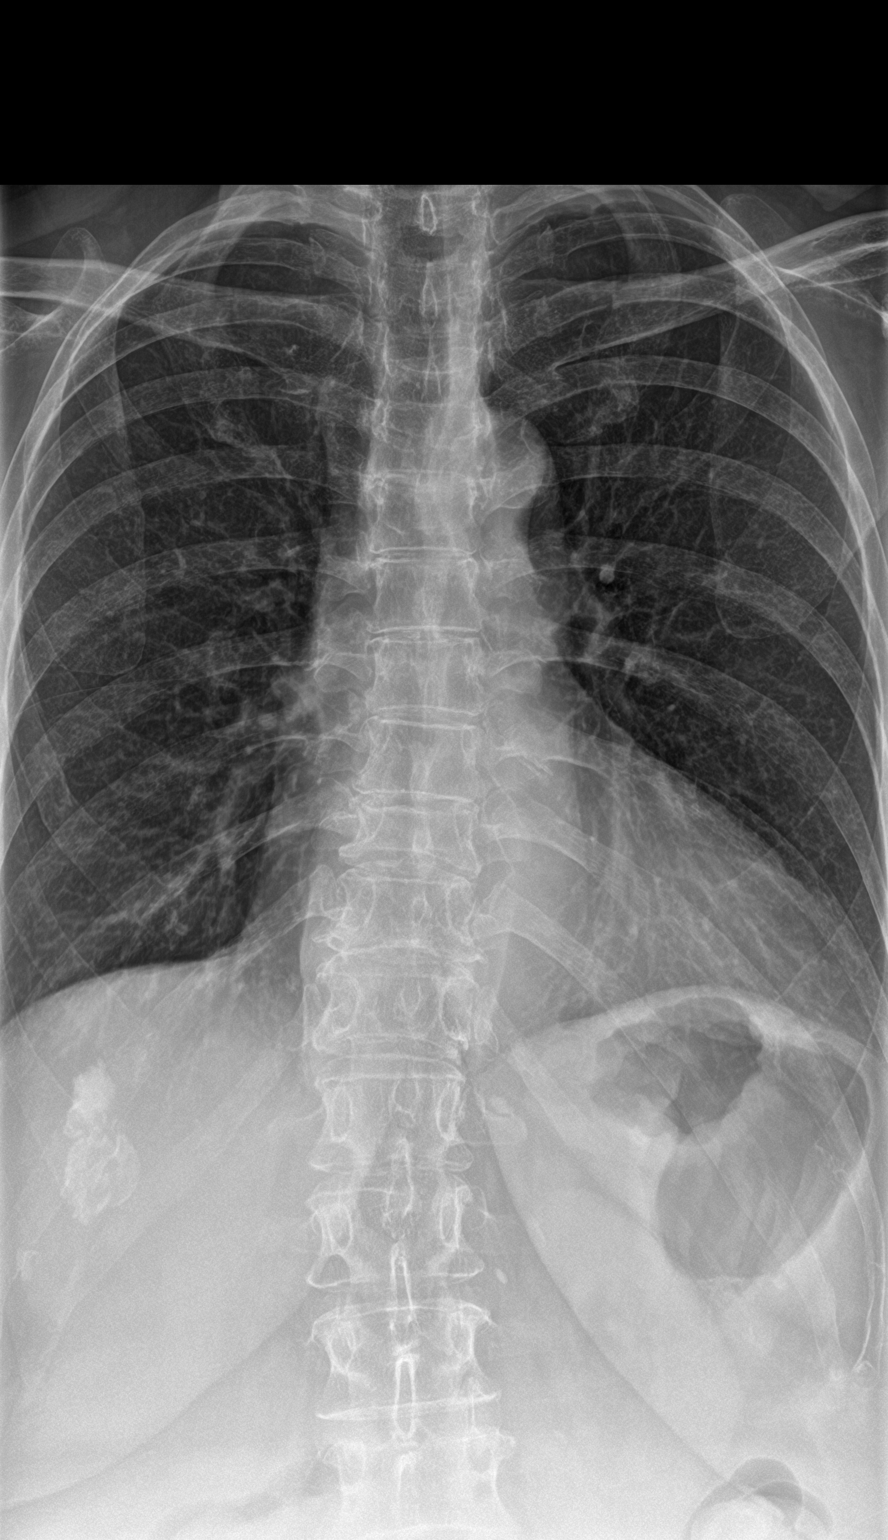

[t-spine lat]
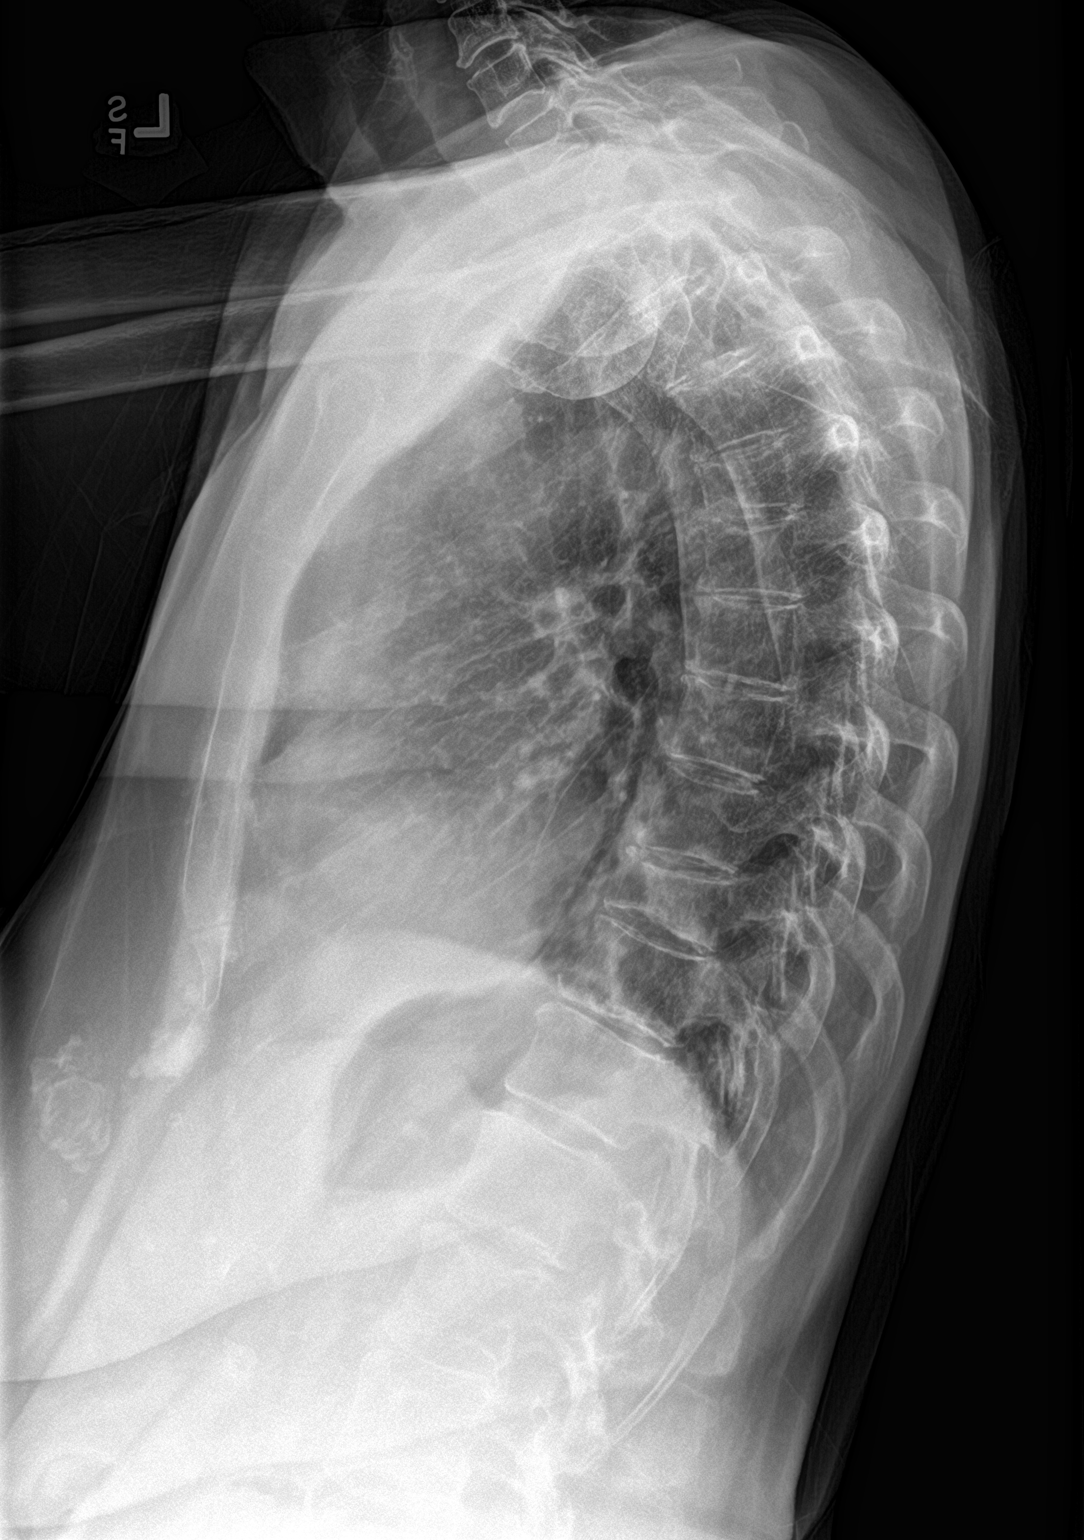

[t-spine swimmers]
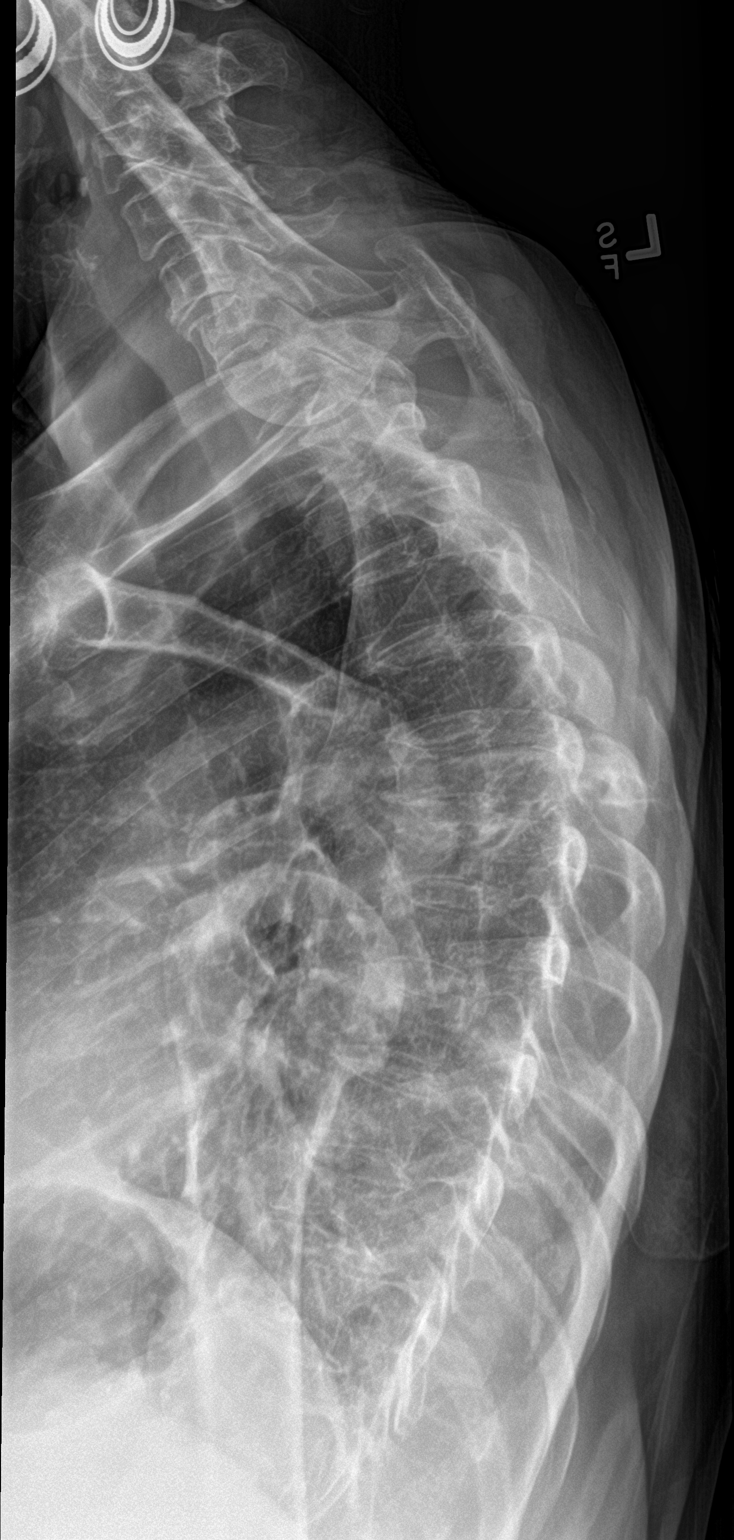

[3 of 3 positions shown; findings below may reference images not displayed]

FINDINGS: There is a compression deformity of T10 with approximately 40%
height loss which is new since prior chest radiograph in [DATE].
There is also a compression deformity of T5 with approximately 30%
height loss and a T6 compression deformity with 10% height loss,
unchanged from prior chest radiograph. There is a large
calcification overlying the right the breast, possibly fat necrosis.
This should be correlated with mammography.
IMPRESSION: New compression fracture of T10 with approximately 40% height loss.

Unchanged mild chronic compression deformities of T5 and T6 without
progressive

Large calcification overlying the right breast, possibly fat
necrosis. This should be correlated with mammography.

## 2021-03-05 IMAGING — DX DG LUMBAR SPINE COMPLETE 4+V
5 series · 5 of 5 positions shown · non-contrast
Comparison: Chest radiograph [DATE], CT [DATE]

CLINICAL DATA: Mid lower back injury for 3 weeks

EXAM:
LUMBAR SPINE - COMPLETE 4+ VIEW

[l-spine ap]
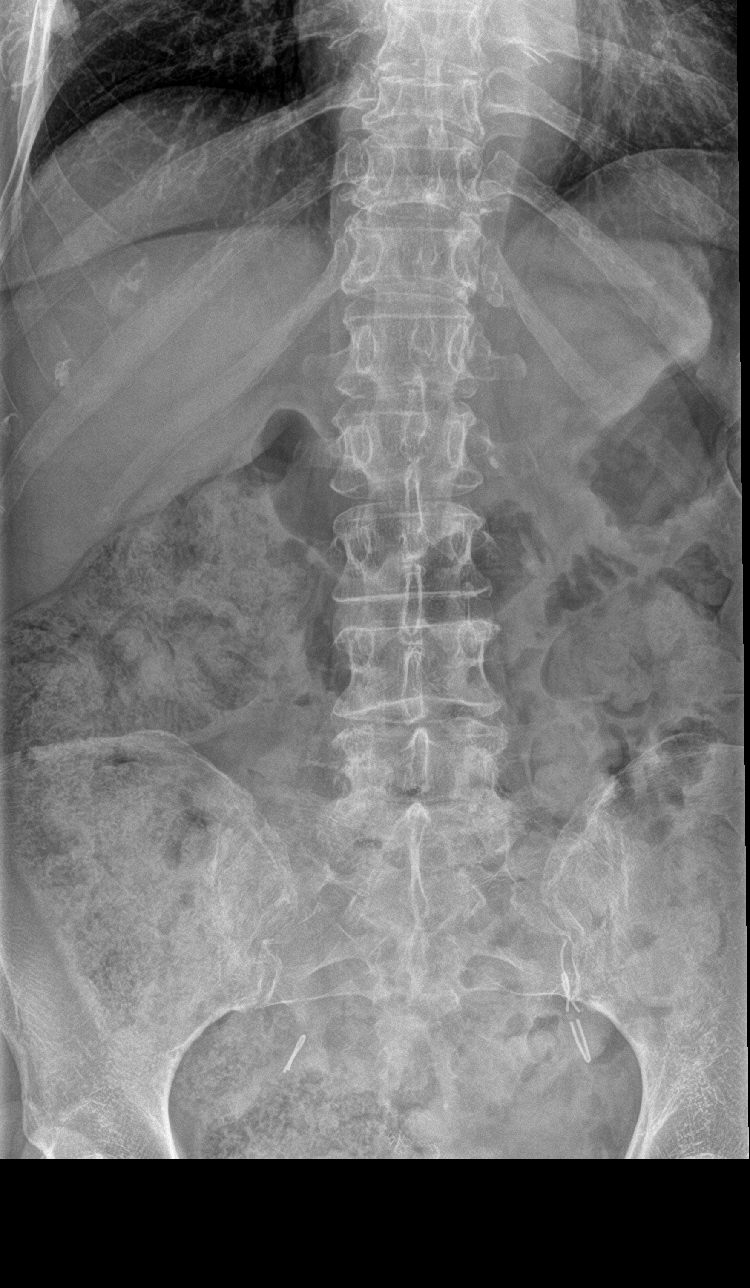

[l-spine obl (1 of 2)]
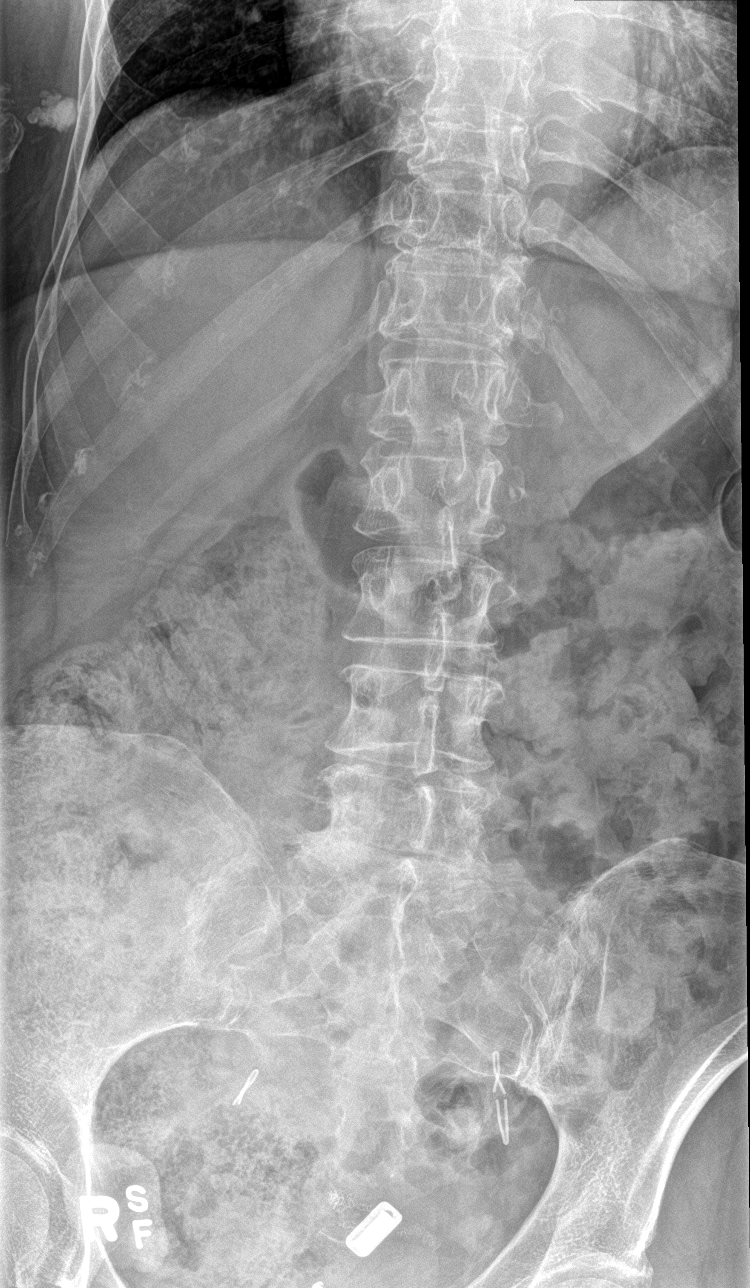

[l-spine obl (2 of 2)]
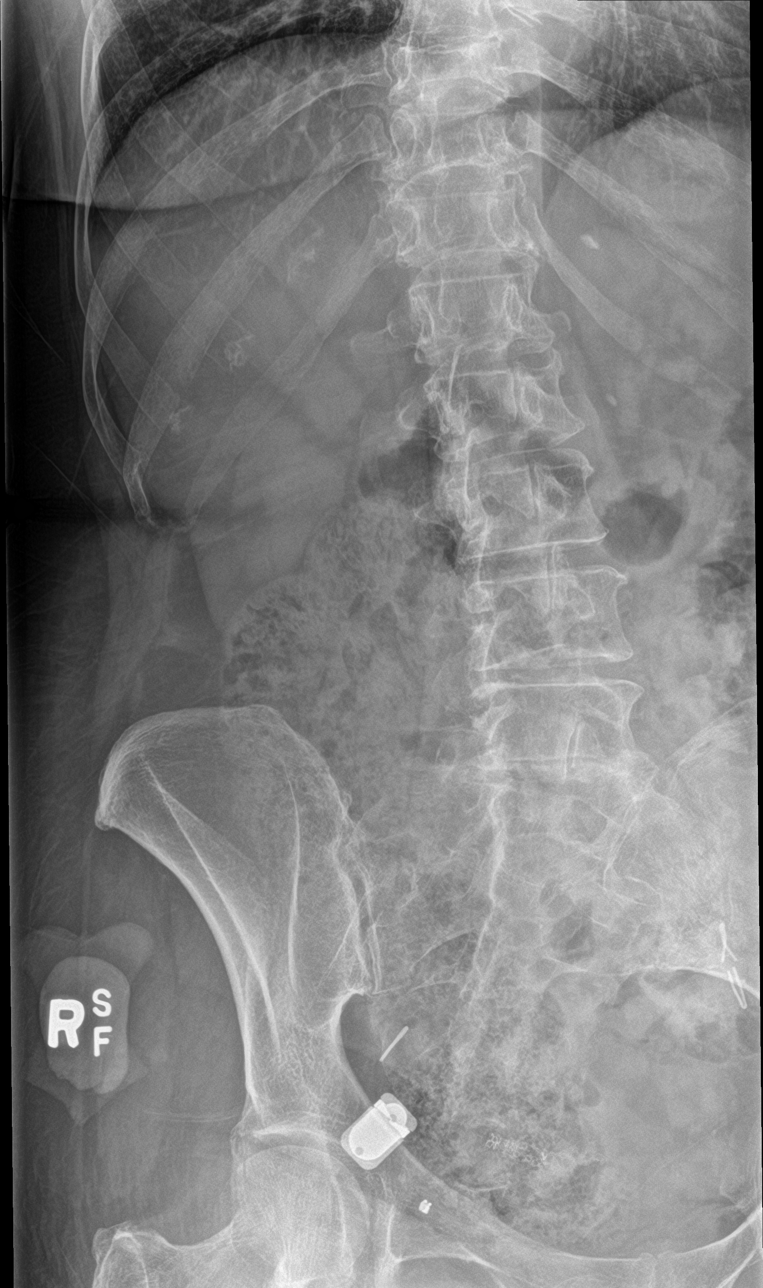

[l-spine lat]
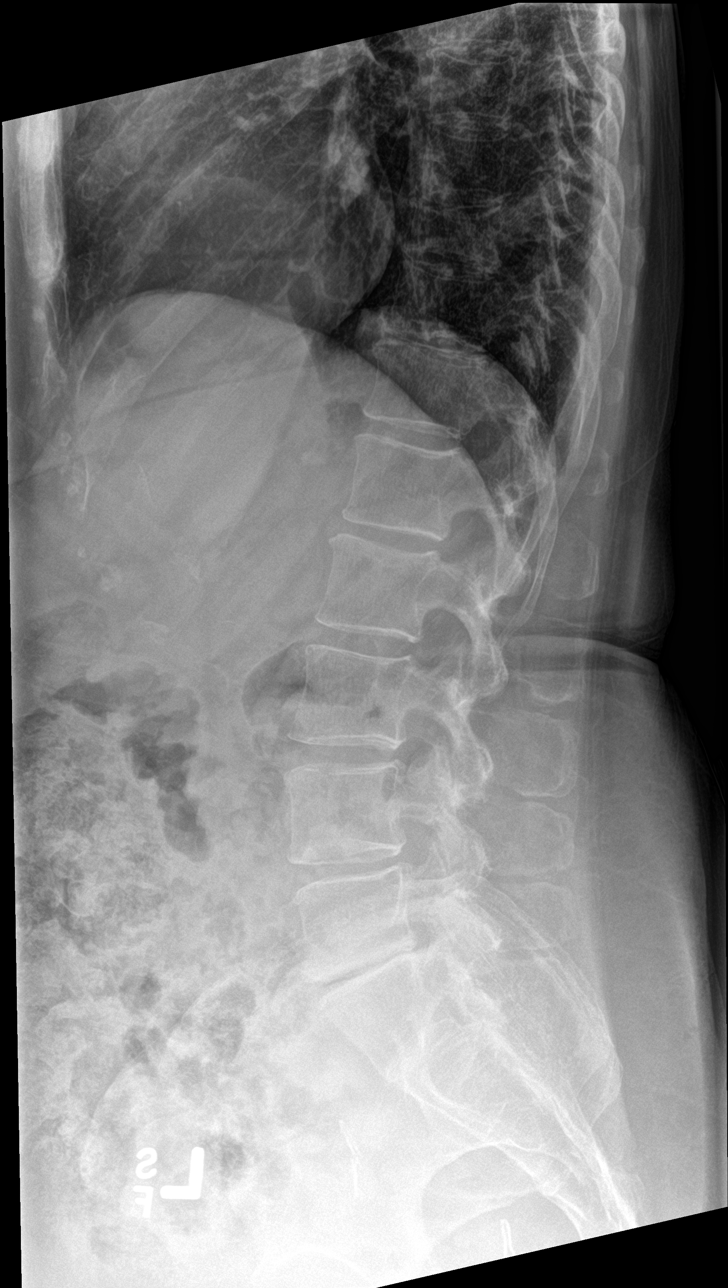

[l-spine spot]
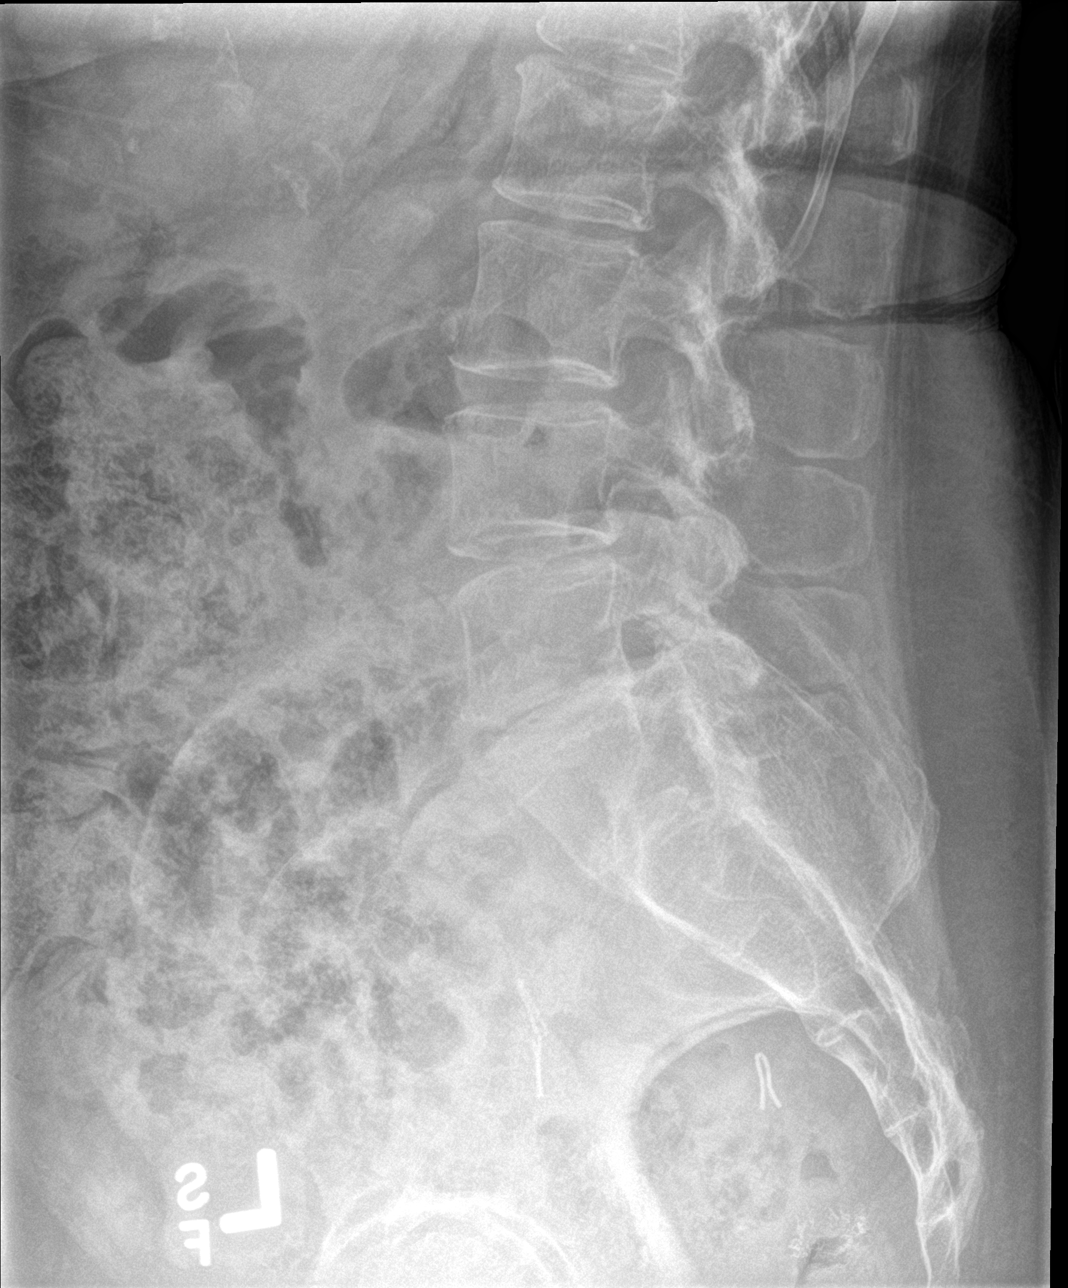

[5 of 5 positions shown; findings below may reference images not displayed]

FINDINGS: There are 5 non-rib-bearing lumbar vertebrae. There is no evidence
of lumbar spine fracture. There is a T10 compression deformity with
approximately 40% height loss, see separately dictated thoracic
spine radiograph. There is mild multilevel degenerative disc
disease, most prominent at L4-L5 and L5-S1. There is mild to
moderate lower lumbar predominant facet arthritis. There are
postsurgical changes overlying the pelvis. There is an additional
metallic object overlying the pelvis which may be exterior to the
patient.
IMPRESSION: T10 compression deformity with approximately 40% height loss, new
since prior chest radiograph in [DATE].

No evidence of lumbar spine fracture.

Mild multilevel degenerative disc disease, most prominent at L4-L5
and L5-S1.

Mild to moderate lower lumbar predominant facet arthropathy.

## 2021-03-05 MED ORDER — METHYLPREDNISOLONE ACETATE 80 MG/ML IJ SUSP
80.0000 mg | Freq: Once | INTRAMUSCULAR | Status: AC
  Administered 2021-03-05: 80 mg via INTRAMUSCULAR

## 2021-03-05 NOTE — Progress Notes (Signed)
Acute Office Visit  Subjective:    Patient ID: Valerie Fisher, female    DOB: Oct 22, 1950, 70 y.o.   MRN: [id number]  Chief Complaint  Patient presents with   Back Pain    HPI Patient is in today for acute back pain.  Was seen initially about 7 days ago for left lower back pain.  She had been cleaning her garage about 5 days prior to that visit and started having some left-sided mid back pain that was radiating towards the buttock area.  Since then she now has developed bilateral mid back pain that radiates towards both outer hips.  A little bit worse on the left compared to the right.  It does not go past the hips.  She has been using Tylenol for pain and it does help some.  She said she had an episode of back pain several years ago and ended up getting an epidural and did get significant relief at that time.  She says it is painful in her mid back to cough or sneeze or to turn.  She says she is having trouble bending over to pick something up out of the floor.  He decided not to take the meloxicam because of increased GI risk.  Past Medical History:  Diagnosis Date   Cancer [facility])    Breast Cancer 2010 right   Fatty liver    GERD (gastroesophageal reflux disease)    History of diverticular abscess of colon 12/12/2017   PONV (postoperative nausea and vomiting)    also slow to wake up   Seizures ([place])    last grand mal seizure was 14 years ago (as of 01/05/21)    Past Surgical History:  Procedure Laterality Date   bladder lift     COLOSTOMY     COLOSTOMY REVERSAL  02/2019   INCISIONAL HERNIA REPAIR N/A 01/07/2021   Procedure: LAPAROSCOPIC [place];  Surgeon: [name], MD;  Location: [facility];  Service: General;  Laterality: N/A;    Family History  Problem Relation Age of Onset   Diabetes Father    Heart attack Father        mid 9s    Social History   Socioeconomic History   Marital status: Married    Spouse name: BJ   Number of children: 3    Years of education: 20   Highest education level: Master's degree (e.g., MA, MS, MEng, MEd, MSW, MBA)  Occupational History   Occupation: retired Pharmacist, hospital  Tobacco Use   Smoking status: Never   Smokeless tobacco: Never  Vaping Use   Vaping Use: Never used  Substance and Sexual Activity   Alcohol use: Not Currently    Alcohol/week: 0.0 standard drinks   Drug use: Never   Sexual activity: Yes    Partners: Male    Birth control/protection: None  Other Topics Concern   Not on file  Social History Narrative   Lives with her husband. They have three children. They have one dog. In her free time, she enjoys cooking and exercising.    Social Determinants of Health   Financial Resource Strain: Low Risk    Difficulty of Paying Living Expenses: Not hard at all  Food Insecurity: No Food Insecurity   Worried About Charity fundraiser in the Last Year: Never true   [place] in the Last Year: Never true  Transportation Needs: No Transportation Needs   Lack of Transportation (Medical): No   Lack of  Transportation (Non-Medical): No  Physical Activity: Sufficiently Active   Days of Exercise per Week: 5 days   Minutes of Exercise per Session: 30 min  Stress: No Stress Concern Present   Feeling of Stress : Not at all  Social Connections: Moderately Isolated   Frequency of Communication with Friends and Family: More than three times a week   Frequency of Social Gatherings with Friends and Family: Once a week   Attends Religious Services: Never   Marine scientist or Organizations: No   Attends Music therapist: Never   Marital Status: Married  Human resources officer Violence: Not At Risk   Fear of Current or Ex-Partner: No   Emotionally Abused: No   Physically Abused: No   Sexually Abused: No    Outpatient Medications Prior to Visit  Medication Sig Dispense Refill   aspirin-acetaminophen-caffeine (EXCEDRIN MIGRAINE) 250-250-65 MG tablet Take 1 tablet by mouth every 6  (six) hours as needed for headache.     Calcium Carbonate-Vit D-Min (CALCIUM 1200 PO) Take 1 tablet by mouth daily.     Cholecalciferol (VITAMIN D) 125 MCG (5000 UT) CAPS Take 5,000 Units by mouth daily.     denosumab (PROLIA) 60 MG/ML SOSY injection Inject 60 mg into the skin every 6 (six) months.     fluticasone (FLONASE) 50 MCG/ACT nasal spray Place 1 spray into both nostrils daily as needed for allergies or rhinitis.     levETIRAcetam (KEPPRA) 500 MG tablet Take 500-1,000 mg by mouth See admin instructions. Take 500 mg in the morning and 1000 mg in the evening  3   Melatonin 5 MG CAPS Take 5 mg by mouth at bedtime as needed (sleep).     Multiple Vitamin (MULTIVITAMIN ADULT PO) Take 2 each by mouth daily.     Multiple Vitamins-Minerals (PRESERVISION AREDS 2+MULTI VIT PO) Take 1 tablet by mouth daily.     sertraline (ZOLOFT) 100 MG tablet Take 1 tablet (100 mg total) by mouth daily. 90 tablet 1   zonisamide (ZONEGRAN) 100 MG capsule Take 400 mg by mouth at bedtime.  1   meloxicam (MOBIC) 15 MG tablet Take 1 tablet (15 mg total) by mouth daily. (Patient not taking: Reported on 03/05/2021) 30 tablet 0   pantoprazole (PROTONIX) 40 MG tablet Take 1 tablet (40 mg total) by mouth 2 (two) times daily before a meal. 60 tablet 3   sucralfate (CARAFATE) 1 g tablet Take 1 tablet (1 g total) by mouth 4 (four) times daily -  with meals and at bedtime. 120 tablet 1   No facility-administered medications prior to visit.    Allergies  Allergen Reactions   Aspirin Other (See Comments)    GI upset    Hydrocodone-Acetaminophen Nausea And Vomiting   Hydromorphone Hcl Nausea And Vomiting   Morphine Nausea And Vomiting   Pseudoephedrine Hcl Nausea And Vomiting   Other Nausea Only    General anesthesia    Zofran [Ondansetron] Other (See Comments)    Headache    Review of Systems     Objective:    Physical Exam Vitals reviewed.  Constitutional:      Appearance: She is well-developed.  HENT:      Head: Normocephalic and atraumatic.  Eyes:     Conjunctiva/sclera: Conjunctivae normal.  Cardiovascular:     Rate and Rhythm: Normal rate.  Pulmonary:     Effort: Pulmonary effort is normal.  Musculoskeletal:     Comments: Mildly decreased flexion.  Significantly decreased extension, that  she had pain with flexion and extension.  Symmetric rotation right and left but she did have pain particularly with rotation to the left.  Nontender over the thoracic spine or paraspinous muscles.  Nontender over the lumbar spine.  She says that when she rotates most of her pain is actually in lateral just above the iliac crest bilaterally.  Skin:    General: Skin is dry.     Coloration: Skin is not pale.  Neurological:     Mental Status: She is alert and oriented to person, place, and time.  Psychiatric:        Behavior: Behavior normal.    BP 109/71   Pulse 87   Ht 5' (1.524 m)   Wt 124 lb 1.3 oz (56.3 kg)   SpO2 100%   BMI 24.23 kg/m  Wt Readings from Last 3 Encounters:  03/05/21 124 lb 1.3 oz (56.3 kg)  02/26/21 126 lb (57.2 kg)  01/07/21 128 lb (58.1 kg)    Health Maintenance Due  Topic Date Due   Zoster Vaccines- Shingrix (1 of 2) Never done    There are no preventive care reminders to display for this patient.   Lab Results  Component Value Date   TSH 1.650 09/12/2019   Lab Results  Component Value Date   WBC 7.7 01/08/2021   HGB 12.4 01/08/2021   HCT 37.9 01/08/2021   MCV 89.6 01/08/2021   PLT 260 01/08/2021   Lab Results  Component Value Date   NA 137 01/08/2021   K 3.6 01/08/2021   CO2 21 (L) 01/08/2021   GLUCOSE 112 (H) 01/08/2021   BUN 9 01/08/2021   CREATININE 0.68 01/08/2021   BILITOT 1.0 01/07/2021   ALKPHOS 43 01/07/2021   AST 16 01/07/2021   ALT 11 01/07/2021   PROT 6.7 01/07/2021   ALBUMIN 4.0 01/07/2021   CALCIUM 8.6 (L) 01/08/2021   ANIONGAP 7 01/08/2021   Lab Results  Component Value Date   CHOL 246 (H) 09/12/2019   Lab Results  Component  Value Date   HDL 88 09/12/2019   Lab Results  Component Value Date   LDLCALC 134 (H) 09/12/2019   Lab Results  Component Value Date   TRIG 138 09/12/2019   Lab Results  Component Value Date   CHOLHDL 2.8 09/12/2019   No results found for: HGBA1C     Assessment & Plan:   Problem List Items Addressed This Visit   None Visit Diagnoses     Mid back pain    -  Primary   Relevant Medications   methylPREDNISolone acetate (DEPO-MEDROL) injection 80 mg (Completed)   Other Relevant Orders   DG Thoracic Spine W/Swimmers   DG Lumbar Spine Complete      Mid back pain-at this point we will go ahead and move forward with x-rays for further evaluation I am a little concerned about potential for compression fracture especially since her pain radiates and its worse with coughing and sneezing we opted to do a steroid injection since she cannot take NSAIDs she said she has received 1 in the past and it was helpful so she would like to try it again I think it is reasonable.  If she is not improving and the x-ray looks okay the recommend formal physical therapy.  Hx of BrCa in 2010.  Consider MRI if not improving.    Meds ordered this encounter  Medications   methylPREDNISolone acetate (DEPO-MEDROL) injection 80 mg     Beatrice Lecher,  MD

## 2021-03-09 ENCOUNTER — Ambulatory Visit (INDEPENDENT_AMBULATORY_CARE_PROVIDER_SITE_OTHER): Payer: Medicare Other | Admitting: Gastroenterology

## 2021-03-09 ENCOUNTER — Encounter: Payer: Self-pay | Admitting: Gastroenterology

## 2021-03-09 ENCOUNTER — Other Ambulatory Visit: Payer: Self-pay

## 2021-03-09 VITALS — BP 124/74 | HR 80 | Ht 60.0 in | Wt 124.0 lb

## 2021-03-09 DIAGNOSIS — K5732 Diverticulitis of large intestine without perforation or abscess without bleeding: Secondary | ICD-10-CM

## 2021-03-09 DIAGNOSIS — R634 Abnormal weight loss: Secondary | ICD-10-CM | POA: Diagnosis not present

## 2021-03-09 DIAGNOSIS — R1013 Epigastric pain: Secondary | ICD-10-CM

## 2021-03-09 DIAGNOSIS — Z9049 Acquired absence of other specified parts of digestive tract: Secondary | ICD-10-CM | POA: Diagnosis not present

## 2021-03-09 DIAGNOSIS — R6881 Early satiety: Secondary | ICD-10-CM | POA: Diagnosis not present

## 2021-03-09 DIAGNOSIS — R630 Anorexia: Secondary | ICD-10-CM

## 2021-03-09 DIAGNOSIS — K582 Mixed irritable bowel syndrome: Secondary | ICD-10-CM | POA: Diagnosis not present

## 2021-03-09 MED ORDER — CALCITONIN (SALMON) 200 UNIT/ACT NA SOLN: 1.0000 | Freq: Every day | NASAL | 1 refills | Status: DC

## 2021-03-09 MED ORDER — CLENPIQ 10-3.5-12 MG-GM -GM/160ML PO SOLN: 1.0000 | ORAL | 0 refills | Status: DC

## 2021-03-09 NOTE — Patient Instructions (Signed)
If you are age 72 or older, your body mass index should be between 23-30. Your Body mass index is 24.22 kg/m. If this is out of the aforementioned range listed, please consider follow up with your Primary Care Provider.  If you are age 59 or younger, your body mass index should be between 19-25. Your Body mass index is 24.22 kg/m. If this is out of the aformentioned range listed, please consider follow up with your Primary Care Provider.   We have sent the following medications to your pharmacy for you to pick up at your convenience:  Clenpiq   Due to recent changes in healthcare laws, you may see the results of your imaging and laboratory studies on MyChart before your provider has had a chance to review them.  We understand that in some cases there may be results that are confusing or concerning to you. Not all laboratory results come back in the same time frame and the provider may be waiting for multiple results in order to interpret others.  Please give [place] 48 hours in order for your provider to thoroughly review all the results before contacting the office for clarification of your results.   Thank you for choosing me and [facility].  Vito Cirigliano, D.O.

## 2021-03-09 NOTE — Progress Notes (Signed)
Chief Complaint: Epigastric pain, discuss colonoscopy  Referring Provider:     Hali Marry,  MD   HPI:    Valerie Fisher is a 70 y.o. female referred to the [facility]astroenterology Clinic for evaluation of epigastric pain.  Epigastric pain started in February.  Pain tends to be worse after eating, but can also have pain with fasting.  + Early satiety and decreased appetite but no sitophobia per se.  Has lost 20# since symptoms onset due to early satiety.  Had trialed course of Protonix, but did not tolerate well (increased abdominal pain, nausea/vomiting, wt loss). No hematochezia or melena.  No prior similar symptoms.  Does have a long history of IBS mixed type.  Index symptoms of alternating bowel habits and abdominal cramping.  Previously followed with [facility], last seen on 04/06/2019.  Previously treated with Bentyl, fiber supplement, low FODMAP diet, along with MiraLAX as needed.  Last colonoscopy 03/2013 as outlined below (no polyps).  Separately, bowel resection with ~22 cm colon removed and colostomy in [id number] for complicated diverticulitis, with reversal in 02/2019.  No colonoscopy since 2014.  Flare of IBS symptoms in February described as alternating bowel habits and cramping.  Symptoms well controlled now. - CT abdomen/pelvis (09/2020): Fatty liver, diverticulosis, herniation of loop of mid transverse colon in anterior abdominal wall hernia without incarceration.  Single loop of small bowel extends into anterior abdominal wall hernia as well.  Subsequently underwent laparoscopic incisional hernia repair with mesh in 12/2020. Doing well post op.   Recent labs from 12/2018 were otherwise largely unremarkable.  Endoscopic History: - Colonoscopy (03/2013): Descending and sigmoid diverticulosis, small internal hemorrhoids   CBC Latest Ref Rng & Units 01/08/2021 01/07/2021 09/22/2020  WBC 4.0 - 10.5 K/uL 7.7 5.2 5.1  Hemoglobin 12.0 - 15.0 g/dL 12.4 14.7 15.2   Hematocrit 36.0 - 46.0 % 37.9 45.3 45.6  Platelets 150 - 400 K/uL 260 302 327   CMP Latest Ref Rng & Units 01/08/2021 01/07/2021 09/22/2020  Glucose 70 - 99 mg/dL 112(H) 100(H) 97  BUN 8 - 23 mg/dL '9 15 11  '$ Creatinine 0.44 - 1.00 mg/dL 0.68 0.80 0.88  Sodium 135 - 145 mmol/L 137 138 141  Potassium 3.5 - 5.1 mmol/L 3.6 3.9 4.6  Chloride 98 - 111 mmol/L 109 106 105  CO2 22 - 32 mmol/L 21(L) 23 21  Calcium 8.9 - 10.3 mg/dL 8.6(L) 8.9 9.7  Total Protein 6.5 - 8.1 g/dL - 6.7 7.2  Total Bilirubin 0.3 - 1.2 mg/dL - 1.0 0.4  Alkaline Phos 38 - 126 U/L - 43 58  AST 15 - 41 U/L - 16 12  ALT 0 - 44 U/L - 11 8     Past Medical History:  Diagnosis Date   Cancer ([place])    Breast Cancer 2010 right   Fatty liver    GERD (gastroesophageal reflux disease)    History of diverticular abscess of colon 12/12/2017   PONV (postoperative nausea and vomiting)    also slow to wake up   Seizures ([place])    last grand mal seizure was 14 years ago (as of 01/05/21)     Past Surgical History:  Procedure Laterality Date   bladder lift     COLOSTOMY     COLOSTOMY REVERSAL  02/2019   INCISIONAL HERNIA REPAIR N/A 01/07/2021   Procedure: LAPAROSCOPIC INCISIONAL HERNIA WITH MESH;  Surgeon: [name], MD;  Location: [facility]  OR;  Service: General;  Laterality: N/A;   Family History  Problem Relation Age of Onset   Diabetes Father    Heart attack Father        mid 65s   Colon cancer Neg Hx    Pancreatic cancer Neg Hx    Stomach cancer Neg Hx    Esophageal cancer Neg Hx    Liver cancer Neg Hx    Social History   Tobacco Use   Smoking status: Never   Smokeless tobacco: Never  Vaping Use   Vaping Use: Never used  Substance Use Topics   Alcohol use: Not Currently    Alcohol/week: 0.0 standard drinks   Drug use: Never   Current Outpatient Medications  Medication Sig Dispense Refill   aspirin-acetaminophen-caffeine (EXCEDRIN MIGRAINE) 250-250-65 MG tablet Take 1 tablet by mouth every 6 (six) hours as  needed for headache.     calcitonin, salmon, (MIACALCIN) 200 UNIT/ACT nasal spray Place 1 spray into alternate nostrils daily. 3.7 mL 1   Calcium Carbonate-Vit D-Min (CALCIUM 1200 PO) Take 1 tablet by mouth daily.     Cholecalciferol (VITAMIN D) 125 MCG (5000 UT) CAPS Take 5,000 Units by mouth daily.     denosumab (PROLIA) 60 MG/ML SOSY injection Inject 60 mg into the skin every 6 (six) months.     fluticasone (FLONASE) 50 MCG/ACT nasal spray Place 1 spray into both nostrils daily as needed for allergies or rhinitis.     levETIRAcetam (KEPPRA) 500 MG tablet Take 500-1,000 mg by mouth See admin instructions. Take 500 mg in the morning and 1000 mg in the evening  3   Melatonin 5 MG CAPS Take 5 mg by mouth at bedtime as needed (sleep).     Multiple Vitamin (MULTIVITAMIN ADULT PO) Take 2 each by mouth daily.     Multiple Vitamins-Minerals (PRESERVISION AREDS 2+MULTI VIT PO) Take 1 tablet by mouth daily.     sertraline (ZOLOFT) 100 MG tablet Take 1 tablet (100 mg total) by mouth daily. 90 tablet 1   zonisamide (ZONEGRAN) 100 MG capsule Take 400 mg by mouth at bedtime.  1   No current facility-administered medications for this visit.   Allergies  Allergen Reactions   Aspirin Other (See Comments)    GI upset    Hydrocodone-Acetaminophen Nausea And Vomiting   Hydromorphone Hcl Nausea And Vomiting   Morphine Nausea And Vomiting   Pseudoephedrine Hcl Nausea And Vomiting   Other Nausea Only    General anesthesia    Zofran [Ondansetron] Other (See Comments)    Headache     Review of Systems: All systems reviewed and negative except where noted in HPI.     Physical Exam:    Wt Readings from Last 3 Encounters:  03/09/21 124 lb (56.2 kg)  03/05/21 124 lb 1.3 oz (56.3 kg)  02/26/21 126 lb (57.2 kg)    BP 124/74   Pulse 80   Ht 5' (1.524 m)   Wt 124 lb (56.2 kg)   SpO2 98%   BMI 24.22 kg/m  Constitutional:  Pleasant, in no acute distress. Psychiatric: Normal mood and affect.  Behavior is normal. EENT: Pupils normal.  Conjunctivae are normal. No scleral icterus. Neck supple. No cervical LAD. Cardiovascular: Normal rate, regular rhythm. No edema Pulmonary/chest: Effort normal and breath sounds normal. No wheezing, rales or rhonchi. Abdominal: Soft, nondistended, nontender. Bowel sounds active throughout. There are no masses palpable. No hepatomegaly. Neurological: Alert and oriented to person place and time. Skin: Skin is  warm and dry. No rashes noted.   ASSESSMENT AND PLAN;   1) Epigastric pain 2) Early satiety 3) Decreased appetite 4) Weight loss - EGD to evaluate for mucosal/luminal pathology with biopsies - Patient intolerant to previous trial of Protonix and prefers to hold off on new medication until EGD  5) Diverticulosis with history of complicated diverticulitis s/p hemicolectomy 6) Colon cancer screening - Repeat colonoscopy now for CRC screening  7) IBS - Symptoms largely well controlled - Evaluate for pathology time of colonoscopy as above  The indications, risks, and benefits of EGD and colonoscopy were explained to the patient in detail. Risks include but are not limited to bleeding, perforation, adverse reaction to medications, and cardiopulmonary compromise. Sequelae include but are not limited to the possibility of surgery, hospitalization, and mortality. The patient verbalized understanding and wished to proceed. All questions answered, referred to scheduler and bowel prep ordered. Further recommendations pending results of the exam.    Lavena Bullion, DO, FACG  03/09/2021, 11:09 AM   Hali Marry, *

## 2021-03-10 ENCOUNTER — Encounter: Payer: Self-pay | Admitting: Family Medicine

## 2021-03-10 DIAGNOSIS — M810 Age-related osteoporosis without current pathological fracture: Secondary | ICD-10-CM

## 2021-03-10 DIAGNOSIS — S22070D Wedge compression fracture of T9-T10 vertebra, subsequent encounter for fracture with routine healing: Secondary | ICD-10-CM

## 2021-03-11 NOTE — Telephone Encounter (Signed)
[place] for ref. Dx osteoporosis with fracture

## 2021-03-16 ENCOUNTER — Other Ambulatory Visit: Payer: Self-pay

## 2021-03-16 ENCOUNTER — Ambulatory Visit (INDEPENDENT_AMBULATORY_CARE_PROVIDER_SITE_OTHER): Payer: Medicare Other | Admitting: Family Medicine

## 2021-03-16 ENCOUNTER — Encounter: Payer: Self-pay | Admitting: Family Medicine

## 2021-03-16 VITALS — BP 111/74 | HR 85 | Temp 98.4°F | Ht 60.0 in | Wt 121.0 lb

## 2021-03-16 DIAGNOSIS — S22070D Wedge compression fracture of T9-T10 vertebra, subsequent encounter for fracture with routine healing: Secondary | ICD-10-CM | POA: Diagnosis not present

## 2021-03-16 DIAGNOSIS — R1013 Epigastric pain: Secondary | ICD-10-CM | POA: Diagnosis not present

## 2021-03-16 MED ORDER — KETOROLAC TROMETHAMINE 60 MG/2ML IM SOLN
60.0000 mg | Freq: Once | INTRAMUSCULAR | Status: AC
  Administered 2021-03-16: 60 mg via INTRAMUSCULAR

## 2021-03-16 NOTE — Progress Notes (Signed)
Established Patient Office Visit  Subjective:  Patient ID: Valerie Fisher, female    DOB: May 01, 1951  Age: 70 y.o. MRN: [id number]  CC:  Chief Complaint  Patient presents with   Back Pain    HPI Valerie Fisher presents for follow-up of T10 compression fracture.  She continued to have back pains when I last saw her we did an x-ray which confirmed a compression fracture at T10 with 40% loss of the vertebral height.  She says she still having some significant discomfort she says after about 30 minutes of standing she has to sit down and wait and rest it radiates around from her mid back bilaterally.  She is doing the calcitonin nasal spray and feels like it has been helping she says she does feel about 30 to 35% better than she did a few weeks ago.  She can now roll over in bed where she was not able to do that a few weeks ago.  And she has been doing some exercises.  She is concerned that tomorrow she is riding in the car for a long car ride  In regards to her epigastric pain she does have her scope scheduled about 2 weeks on August 17.  She is lost a little bit more weight and just does not want to continue to lose weight.  She says she can just eat a little bit and then starts to feel full and nauseated.  She has been trying to do some protein drinks and that has been helping.  Past Medical History:  Diagnosis Date   Cancer [facility])    Breast Cancer 2010 right   Fatty liver    GERD (gastroesophageal reflux disease)    History of diverticular abscess of colon 12/12/2017   PONV (postoperative nausea and vomiting)    also slow to wake up   Seizures ([place])    last grand mal seizure was 14 years ago (as of 01/05/21)    Past Surgical History:  Procedure Laterality Date   bladder lift     COLOSTOMY     COLOSTOMY REVERSAL  02/2019   INCISIONAL HERNIA REPAIR N/A 01/07/2021   Procedure: LAPAROSCOPIC INCISIONAL HERNIA WITH MESH;  Surgeon: [name], MD;  Location: [place];  Service: General;   Laterality: N/A;    Family History  Problem Relation Age of Onset   Diabetes Father    Heart attack Father        mid 70s   Colon cancer Neg Hx    Pancreatic cancer Neg Hx    Stomach cancer Neg Hx    Esophageal cancer Neg Hx    Liver cancer Neg Hx     Social History   Socioeconomic History   Marital status: Married    Spouse name: BJ   Number of children: 3   Years of education: 20   Highest education level: Master's degree (e.g., MA, MS, MEng, MEd, MSW, MBA)  Occupational History   Occupation: retired Pharmacist, hospital  Tobacco Use   Smoking status: Never   Smokeless tobacco: Never  Vaping Use   Vaping Use: Never used  Substance and Sexual Activity   Alcohol use: Not Currently    Alcohol/week: 0.0 standard drinks   Drug use: Never   Sexual activity: Yes    Partners: Male    Birth control/protection: None  Other Topics Concern   Not on file  Social History Narrative   Lives with her husband. They have three children. They have one dog. In  her free time, she enjoys cooking and exercising.    Social Determinants of Health   Financial Resource Strain: Low Risk    Difficulty of Paying Living Expenses: Not hard at all  Food Insecurity: No Food Insecurity   Worried About Charity fundraiser in the Last Year: Never true   [place] in the Last Year: Never true  Transportation Needs: No Transportation Needs   Lack of Transportation (Medical): No   Lack of Transportation (Non-Medical): No  Physical Activity: Sufficiently Active   Days of Exercise per Week: 5 days   Minutes of Exercise per Session: 30 min  Stress: No Stress Concern Present   Feeling of Stress : Not at all  Social Connections: Moderately Isolated   Frequency of Communication with Friends and Family: More than three times a week   Frequency of Social Gatherings with Friends and Family: Once a week   Attends Religious Services: Never   Marine scientist or Organizations: No   Attends Programme researcher, broadcasting/film/video: Never   Marital Status: Married  Human resources officer Violence: Not At Risk   Fear of Current or Ex-Partner: No   Emotionally Abused: No   Physically Abused: No   Sexually Abused: No    Outpatient Medications Prior to Visit  Medication Sig Dispense Refill   aspirin-acetaminophen-caffeine (EXCEDRIN MIGRAINE) 250-250-65 MG tablet Take 1 tablet by mouth every 6 (six) hours as needed for headache.     calcitonin, salmon, (MIACALCIN) 200 UNIT/ACT nasal spray Place 1 spray into alternate nostrils daily. 3.7 mL 1   Calcium Carbonate-Vit D-Min (CALCIUM 1200 PO) Take 1 tablet by mouth daily.     Cholecalciferol (VITAMIN D) 125 MCG (5000 UT) CAPS Take 5,000 Units by mouth daily.     denosumab (PROLIA) 60 MG/ML SOSY injection Inject 60 mg into the skin every 6 (six) months.     fluticasone (FLONASE) 50 MCG/ACT nasal spray Place 1 spray into both nostrils daily as needed for allergies or rhinitis.     levETIRAcetam (KEPPRA) 500 MG tablet Take 500-1,000 mg by mouth See admin instructions. Take 500 mg in the morning and 1000 mg in the evening  3   Melatonin 5 MG CAPS Take 5 mg by mouth at bedtime as needed (sleep).     Multiple Vitamin (MULTIVITAMIN ADULT PO) Take 2 each by mouth daily.     sertraline (ZOLOFT) 100 MG tablet Take 1 tablet (100 mg total) by mouth daily. 90 tablet 1   Sod Picosulfate-Mag Ox-Cit Acd (CLENPIQ) 10-3.5-12 MG-GM -GM/160ML SOLN Take 1 kit by mouth as directed. 320 mL 0   zonisamide (ZONEGRAN) 100 MG capsule Take 400 mg by mouth at bedtime.  1   Multiple Vitamins-Minerals (PRESERVISION AREDS 2+MULTI VIT PO) Take 1 tablet by mouth daily.     No facility-administered medications prior to visit.    Allergies  Allergen Reactions   Aspirin Other (See Comments)    GI upset    Hydrocodone-Acetaminophen Nausea And Vomiting   Hydromorphone Hcl Nausea And Vomiting   Morphine Nausea And Vomiting   Pseudoephedrine Hcl Nausea And Vomiting   Other Nausea Only     General anesthesia    Zofran [Ondansetron] Other (See Comments)    Headache    ROS Review of Systems    Objective:    Physical Exam Vitals reviewed.  Constitutional:      Appearance: She is well-developed.  HENT:     Head: Normocephalic and atraumatic.  Eyes:  Conjunctiva/sclera: Conjunctivae normal.  Cardiovascular:     Rate and Rhythm: Normal rate.  Pulmonary:     Effort: Pulmonary effort is normal.  Skin:    General: Skin is dry.     Coloration: Skin is not pale.  Neurological:     Mental Status: She is alert and oriented to person, place, and time.  Psychiatric:        Behavior: Behavior normal.    BP 111/74   Pulse 85   Temp 98.4 F (36.9 C) (Oral)   Ht 5' (1.524 m)   Wt 121 lb (54.9 kg)   SpO2 100% Comment: on RA  BMI 23.63 kg/m  Wt Readings from Last 3 Encounters:  03/16/21 121 lb (54.9 kg)  03/09/21 124 lb (56.2 kg)  03/05/21 124 lb 1.3 oz (56.3 kg)     Health Maintenance Due  Topic Date Due   Zoster Vaccines- Shingrix (1 of 2) Never done   COVID-19 Vaccine (4 - Booster for Pfizer series) 02/07/2020   INFLUENZA VACCINE  03/16/2021    There are no preventive care reminders to display for this patient.  Lab Results  Component Value Date   TSH 1.650 09/12/2019   Lab Results  Component Value Date   WBC 7.7 01/08/2021   HGB 12.4 01/08/2021   HCT 37.9 01/08/2021   MCV 89.6 01/08/2021   PLT 260 01/08/2021   Lab Results  Component Value Date   NA 137 01/08/2021   K 3.6 01/08/2021   CO2 21 (L) 01/08/2021   GLUCOSE 112 (H) 01/08/2021   BUN 9 01/08/2021   CREATININE 0.68 01/08/2021   BILITOT 1.0 01/07/2021   ALKPHOS 43 01/07/2021   AST 16 01/07/2021   ALT 11 01/07/2021   PROT 6.7 01/07/2021   ALBUMIN 4.0 01/07/2021   CALCIUM 8.6 (L) 01/08/2021   ANIONGAP 7 01/08/2021   Lab Results  Component Value Date   CHOL 246 (H) 09/12/2019   Lab Results  Component Value Date   HDL 88 09/12/2019   Lab Results  Component Value Date    LDLCALC 134 (H) 09/12/2019   Lab Results  Component Value Date   TRIG 138 09/12/2019   Lab Results  Component Value Date   CHOLHDL 2.8 09/12/2019   No results found for: HGBA1C    Assessment & Plan:   Problem List Items Addressed This Visit   None Visit Diagnoses     Compression fracture of T10 vertebra with routine healing, subsequent encounter    -  Primary   Epigastric pain          Compression fracture. -We discussed options including potential treatment like vertebroplasty she wondered if a steroid injection would be helpful but I discussed that that would be a good idea since she has a known compression fracture we can try Toradol just for short-term to see if this helps her with her travel tomorrow encouraged her to use a cushion make sure she stretching well and avoid any heavy lifting she has had about a 35% improvement in pain over the last couple of weeks so I do think she is going in the right direction we discussed is try to get a little bit better pain control continuing with the calcitonin and seeing if over the next 3 to 4 weeks she continues to improve significantly if not we can go from there and get her in with sports med and or orthopedist.  Epigastric pain-she has appointment coming up in about 2 weeks  we just discussed ways to try to keep her protein and calories even though she does not have much of an appetite.  She really thinks the epigastric pain was started when she was taking multiple medications she thinks it really just upset her stomach.  No orders of the defined types were placed in this encounter.   Follow-up: Return in about 4 weeks (around 04/13/2021) for fracture, compression T10.    Beatrice Lecher, MD

## 2021-03-16 NOTE — Addendum Note (Signed)
Addended by: Fonnie Mu on: 03/16/2021 04:10 PM   Modules accepted: Orders

## 2021-03-19 ENCOUNTER — Other Ambulatory Visit: Payer: Self-pay | Admitting: Family Medicine

## 2021-03-19 DIAGNOSIS — F411 Generalized anxiety disorder: Secondary | ICD-10-CM

## 2021-03-24 ENCOUNTER — Other Ambulatory Visit: Payer: Self-pay

## 2021-03-24 ENCOUNTER — Ambulatory Visit: Payer: Medicare Other

## 2021-03-24 DIAGNOSIS — M810 Age-related osteoporosis without current pathological fracture: Secondary | ICD-10-CM

## 2021-03-24 NOTE — Progress Notes (Signed)
Ordered labs for Prolia injection.  

## 2021-03-26 ENCOUNTER — Ambulatory Visit: Payer: Medicare Other

## 2021-03-27 ENCOUNTER — Other Ambulatory Visit: Payer: Self-pay | Admitting: Family Medicine

## 2021-03-27 DIAGNOSIS — M810 Age-related osteoporosis without current pathological fracture: Secondary | ICD-10-CM | POA: Diagnosis not present

## 2021-03-28 LAB — CMP14+EGFR
ALT: 9 IU/L (ref 0–32)
AST: 15 IU/L (ref 0–40)
Albumin/Globulin Ratio: 2.2 (ref 1.2–2.2)
Albumin: 4.9 g/dL — ABNORMAL HIGH (ref 3.7–4.7)
Alkaline Phosphatase: 86 IU/L (ref 44–121)
BUN/Creatinine Ratio: 33 — ABNORMAL HIGH (ref 12–28)
BUN: 25 mg/dL (ref 8–27)
Bilirubin Total: 0.4 mg/dL (ref 0.0–1.2)
CO2: 22 mmol/L (ref 20–29)
Calcium: 9.8 mg/dL (ref 8.7–10.3)
Chloride: 104 mmol/L (ref 96–106)
Creatinine, Ser: 0.75 mg/dL (ref 0.57–1.00)
Globulin, Total: 2.2 g/dL (ref 1.5–4.5)
Glucose: 96 mg/dL (ref 65–99)
Potassium: 4.9 mmol/L (ref 3.5–5.2)
Sodium: 142 mmol/L (ref 134–144)
Total Protein: 7.1 g/dL (ref 6.0–8.5)
eGFR: 85 mL/min/{1.73_m2} (ref >59)

## 2021-03-31 NOTE — Progress Notes (Signed)
Your lab work is within acceptable range and there are no concerning findings.   ?

## 2021-04-01 ENCOUNTER — Ambulatory Visit (AMBULATORY_SURGERY_CENTER): Payer: Medicare Other | Admitting: Gastroenterology

## 2021-04-01 ENCOUNTER — Other Ambulatory Visit: Payer: Self-pay

## 2021-04-01 ENCOUNTER — Encounter: Payer: Self-pay | Admitting: Gastroenterology

## 2021-04-01 VITALS — BP 124/64 | HR 61 | Temp 97.1°F | Resp 14 | Ht 61.0 in | Wt 124.0 lb

## 2021-04-01 DIAGNOSIS — K648 Other hemorrhoids: Secondary | ICD-10-CM

## 2021-04-01 DIAGNOSIS — R1013 Epigastric pain: Secondary | ICD-10-CM

## 2021-04-01 DIAGNOSIS — K2990 Gastroduodenitis, unspecified, without bleeding: Secondary | ICD-10-CM

## 2021-04-01 DIAGNOSIS — K2980 Duodenitis without bleeding: Secondary | ICD-10-CM | POA: Diagnosis not present

## 2021-04-01 DIAGNOSIS — K5732 Diverticulitis of large intestine without perforation or abscess without bleeding: Secondary | ICD-10-CM | POA: Diagnosis not present

## 2021-04-01 DIAGNOSIS — K2970 Gastritis, unspecified, without bleeding: Secondary | ICD-10-CM | POA: Diagnosis not present

## 2021-04-01 DIAGNOSIS — K5730 Diverticulosis of large intestine without perforation or abscess without bleeding: Secondary | ICD-10-CM

## 2021-04-01 DIAGNOSIS — R6881 Early satiety: Secondary | ICD-10-CM | POA: Diagnosis not present

## 2021-04-01 DIAGNOSIS — K2950 Unspecified chronic gastritis without bleeding: Secondary | ICD-10-CM | POA: Diagnosis not present

## 2021-04-01 DIAGNOSIS — K635 Polyp of colon: Secondary | ICD-10-CM | POA: Diagnosis not present

## 2021-04-01 DIAGNOSIS — R634 Abnormal weight loss: Secondary | ICD-10-CM

## 2021-04-01 DIAGNOSIS — R630 Anorexia: Secondary | ICD-10-CM | POA: Diagnosis not present

## 2021-04-01 DIAGNOSIS — K582 Mixed irritable bowel syndrome: Secondary | ICD-10-CM | POA: Diagnosis not present

## 2021-04-01 DIAGNOSIS — K640 First degree hemorrhoids: Secondary | ICD-10-CM

## 2021-04-01 DIAGNOSIS — D125 Benign neoplasm of sigmoid colon: Secondary | ICD-10-CM

## 2021-04-01 MED ORDER — PANTOPRAZOLE SODIUM 40 MG PO TBEC: 40.0000 mg | DELAYED_RELEASE_TABLET | Freq: Two times a day (BID) | ORAL | 0 refills | Status: DC

## 2021-04-01 MED ORDER — SODIUM CHLORIDE 0.9 % IV SOLN: 500.0000 mL | INTRAVENOUS | Status: DC

## 2021-04-01 NOTE — Progress Notes (Signed)
VS completed by AS.   No BP/IV sticks on right arm.   Pt's states no medical or surgical changes since previsit or office visit.

## 2021-04-01 NOTE — Progress Notes (Signed)
GASTROENTEROLOGY PROCEDURE H&P NOTE   Primary Care Physician: Hali Marry, MD    Reason for Procedure:   Epigastric pain, CRC screening  Plan:    EGD, Colonoscopy  Patient is appropriate for endoscopic procedure(s) in the ambulatory ([place]) setting.  The nature of the procedure, as well as the risks, benefits, and alternatives were carefully and thoroughly reviewed with the patient. Ample time for discussion and questions allowed. The patient understood, was satisfied, and agreed to proceed.     HPI: Valerie Fisher is a 70 y.o. female who presents for EGD and colonoscopy. She was evaluted by me on the GI clinic on 03/09/2021 for evaluation of epigastric pain, early satiety, decreased appetite, 20# wt loss, along with CRC screening. Refer to that note for additional details as needed.   Past Medical History:  Diagnosis Date   Cancer [facility])    Breast Cancer 2010 right   Fatty liver    GERD (gastroesophageal reflux disease)    History of diverticular abscess of colon 12/12/2017   PONV (postoperative nausea and vomiting)    also slow to wake up   Seizures ([place])    last grand mal seizure was 14 years ago (as of 01/05/21)    Past Surgical History:  Procedure Laterality Date   bladder lift     COLOSTOMY     COLOSTOMY REVERSAL  02/2019   INCISIONAL HERNIA REPAIR N/A 01/07/2021   Procedure: LAPAROSCOPIC INCISIONAL HERNIA WITH MESH;  Surgeon: [name], MD;  Location: [place];  Service: General;  Laterality: N/A;    Prior to Admission medications   Medication Sig Start Date End Date Taking? Authorizing Provider  Calcium Carbonate-Vit D-Min (CALCIUM 1200 PO) Take 1 tablet by mouth daily.   Yes [provider]  Cholecalciferol (VITAMIN D) 125 MCG (5000 UT) CAPS Take 5,000 Units by mouth daily.   Yes [provider]  levETIRAcetam (KEPPRA) 500 MG tablet Take 500-1,000 mg by mouth See admin instructions. Take 500 mg in the morning and 1000 mg in the  evening 10/14/20  Yes Sam, Aline August, MD  Melatonin 5 MG CAPS Take 5 mg by mouth at bedtime as needed (sleep).   Yes [provider]  Multiple Vitamin (MULTIVITAMIN ADULT PO) Take 2 each by mouth daily.   Yes [provider]  sertraline (ZOLOFT) 100 MG tablet Take 1 tablet (100 mg total) by mouth daily. 03/24/21  Yes Hali Marry, MD  zonisamide (ZONEGRAN) 100 MG capsule Take 400 mg by mouth at bedtime. 10/14/20  Yes SamAline August, MD  aspirin-acetaminophen-caffeine (EXCEDRIN MIGRAINE) [phone] MG tablet Take 1 tablet by mouth every 6 (six) hours as needed for headache.    [provider]  calcitonin, salmon, (MIACALCIN) 200 UNIT/ACT nasal spray Place 1 spray into alternate nostrils daily. 03/09/21   Hali Marry, MD  denosumab (PROLIA) 60 MG/ML SOSY injection Inject 60 mg into the skin every 6 (six) months. 09/24/19   [provider]  fluticasone (FLONASE) 50 MCG/ACT nasal spray Place 1 spray into both nostrils daily as needed for allergies or rhinitis.    [provider]    Current Outpatient Medications  Medication Sig Dispense Refill   Calcium Carbonate-Vit D-Min (CALCIUM 1200 PO) Take 1 tablet by mouth daily.     Cholecalciferol (VITAMIN D) 125 MCG (5000 UT) CAPS Take 5,000 Units by mouth daily.     levETIRAcetam (KEPPRA) 500 MG tablet Take 500-1,000 mg by mouth See admin instructions. Take 500 mg  in the morning and 1000 mg in the evening  3   Melatonin 5 MG CAPS Take 5 mg by mouth at bedtime as needed (sleep).     Multiple Vitamin (MULTIVITAMIN ADULT PO) Take 2 each by mouth daily.     sertraline (ZOLOFT) 100 MG tablet Take 1 tablet (100 mg total) by mouth daily. 30 tablet 0   zonisamide (ZONEGRAN) 100 MG capsule Take 400 mg by mouth at bedtime.  1   aspirin-acetaminophen-caffeine (EXCEDRIN MIGRAINE) 250-250-65 MG tablet Take 1 tablet by mouth every 6 (six) hours as needed for headache.     calcitonin, salmon, (MIACALCIN) 200 UNIT/ACT  nasal spray Place 1 spray into alternate nostrils daily. 3.7 mL 1   denosumab (PROLIA) 60 MG/ML SOSY injection Inject 60 mg into the skin every 6 (six) months.     fluticasone (FLONASE) 50 MCG/ACT nasal spray Place 1 spray into both nostrils daily as needed for allergies or rhinitis.     Current Facility-Administered Medications  Medication Dose Route Frequency Provider Last Rate Last Admin   0.9 %  sodium chloride infusion  500 mL Intravenous Continuous Gilberto Stanforth V, DO        Allergies as of 04/01/2021 - Review Complete 04/01/2021  Allergen Reaction Noted   Aspirin Other (See Comments) 08/18/2011   Hydrocodone-acetaminophen Nausea And Vomiting 08/18/2011   Hydromorphone hcl Nausea And Vomiting 12/12/2017   Morphine Nausea And Vomiting 12/12/2017   Pseudoephedrine hcl Nausea And Vomiting 08/18/2011   Other Nausea Only 01/02/2021   Zofran [ondansetron] Other (See Comments) 10/24/2020    Family History  Problem Relation Age of Onset   Diabetes Father    Heart attack Father        mid 6s   Colon cancer Neg Hx    Pancreatic cancer Neg Hx    Stomach cancer Neg Hx    Esophageal cancer Neg Hx    Liver cancer Neg Hx     Social History   Socioeconomic History   Marital status: Married    Spouse name: BJ   Number of children: 3   Years of education: 20   Highest education level: Master's degree (e.g., MA, MS, MEng, MEd, MSW, MBA)  Occupational History   Occupation: retired Pharmacist, hospital  Tobacco Use   Smoking status: Never   Smokeless tobacco: Never  Vaping Use   Vaping Use: Never used  Substance and Sexual Activity   Alcohol use: Not Currently    Alcohol/week: 0.0 standard drinks   Drug use: Never   Sexual activity: Yes    Partners: Male    Birth control/protection: None  Other Topics Concern   Not on file  Social History Narrative   Lives with her husband. They have three children. They have one dog. In her free time, she enjoys cooking and exercising.    Social  Determinants of Health   Financial Resource Strain: Low Risk    Difficulty of Paying Living Expenses: Not hard at all  Food Insecurity: No Food Insecurity   Worried About Charity fundraiser in the Last Year: Never true   [place] in the Last Year: Never true  Transportation Needs: No Transportation Needs   Lack of Transportation (Medical): No   Lack of Transportation (Non-Medical): No  Physical Activity: Sufficiently Active   Days of Exercise per Week: 5 days   Minutes of Exercise per Session: 30 min  Stress: No Stress Concern Present   Feeling of Stress : Not at all  Social Connections: Moderately Isolated   Frequency of Communication with Friends and Family: More than three times a week   Frequency of Social Gatherings with Friends and Family: Once a week   Attends Religious Services: Never   Marine scientist or Organizations: No   Attends Music therapist: Never   Marital Status: Married  Human resources officer Violence: Not At Risk   Fear of Current or Ex-Partner: No   Emotionally Abused: No   Physically Abused: No   Sexually Abused: No    Physical Exam: Vital signs in last 24 hours: '@BP'$  136/89   Pulse 71   Temp (!) 97.1 F (36.2 C)   Resp 13   Ht '5\' 1"'$  (1.549 m)   Wt 124 lb (56.2 kg)   SpO2 100%   BMI 23.43 kg/m  GEN: NAD EYE: Sclerae anicteric ENT: MMM CV: Non-tachycardic Pulm: CTA b/l GI: Soft, NT/ND NEURO:  Alert & Oriented x 3   Gerrit Heck, DO [facility]   04/01/2021 3:30 PM

## 2021-04-01 NOTE — Op Note (Signed)
[facility] Patient Name: Valerie Fisher Procedure Date: 04/01/2021 3:23 PM MRN: [id number] Endoscopist: Gerrit Heck , MD Age: 70 Referring MD:  Date of Birth: 1951/06/05 Gender: Female Account #: [id number] Procedure:                Colonoscopy Indications:              Screening for colorectal malignant neoplasm, Weight                            loss                           History of bowel resection with ~22 cm colon                            removed and colostomy in [id number] for complicated                            diverticulitis, with reversal in 02/2019. No                            colonoscopy since 2014. Medicines:                Monitored Anesthesia Care Procedure:                Pre-Anesthesia Assessment:                           - Prior to the procedure, a History and Physical                            was performed, and patient medications and                            allergies were reviewed. The patient's tolerance of                            previous anesthesia was also reviewed. The risks                            and benefits of the procedure and the sedation                            options and risks were discussed with the patient.                            All questions were answered, and informed consent                            was obtained. Prior Anticoagulants: The patient has                            taken no previous anticoagulant or antiplatelet  agents. ASA Grade Assessment: II - A patient with                            mild systemic disease. After reviewing the risks                            and benefits, the patient was deemed in                            satisfactory condition to undergo the procedure.                           After obtaining informed consent, the colonoscope                            was passed under direct vision. Throughout the                            procedure, the  patient's blood pressure, pulse, and                            oxygen saturations were monitored continuously. The                            CF [id number] was introduced through the anus                            and advanced to the the terminal ileum. The                            colonoscopy was performed without difficulty. The                            patient tolerated the procedure well. The quality                            of the bowel preparation was adequate. The terminal                            ileum, ileocecal valve, appendiceal orifice, and                            rectum were photographed. Scope In: 3:46:53 PM Scope Out: 4:02:26 PM Scope Withdrawal Time: 0 hours 9 minutes 47 seconds  Total Procedure Duration: 0 hours 15 minutes 33 seconds  Findings:                 The perianal and digital rectal examinations were                            normal.                           There was evidence of a prior end-to-end  colo-colonic anastomosis in the rectum. This was                            patent and was characterized by healthy appearing                            mucosa. The anastomosis was traversed.                           Multiple small and large-mouthed diverticula were                            found in the sigmoid colon, descending colon,                            transverse colon, ascending colon and cecum.                           A 3 mm polyp was found in the sigmoid colon. The                            polyp was sessile. The polyp was removed with a                            cold biopsy forceps. Resection and retrieval were                            complete. Estimated blood loss was minimal.                           Non-bleeding internal hemorrhoids were found during                            retroflexion. The hemorrhoids were small.                           The terminal ileum appeared normal.                            A moderate amount of semi-liquid stool was found in                            the ascending colon and in the cecum. Lavage of the                            area was performed using copious amounts of tap                            water, resulting in clearance with adequate                            visualization. Complications:            No immediate complications. Estimated Blood Loss:     Estimated blood  loss was minimal. Impression:               - Patent end-to-end colo-colonic anastomosis,                            characterized by healthy appearing mucosa.                           - Diverticulosis in the sigmoid colon, in the                            descending colon, in the transverse colon, in the                            ascending colon and in the cecum.                           - One 3 mm polyp in the sigmoid colon, removed with                            a cold biopsy forceps. Resected and retrieved.                           - Non-bleeding internal hemorrhoids.                           - The examined portion of the ileum was normal.                           - Stool in the ascending colon and in the cecum. Recommendation:           - Patient has a contact number available for                            emergencies. The signs and symptoms of potential                            delayed complications were discussed with the                            patient. Return to normal activities tomorrow.                            Written discharge instructions were provided to the                            patient.                           - Resume previous diet.                           - Continue present medications.                           - Await pathology results.                           -  Repeat colonoscopy for surveillance based on                            pathology results. Gerrit Heck, MD 04/01/2021 4:16:42 PM

## 2021-04-01 NOTE — Progress Notes (Signed)
Called to room to assist during endoscopic procedure.  Patient ID and intended procedure confirmed with present staff. Received instructions for my participation in the procedure from the performing physician.  

## 2021-04-01 NOTE — Op Note (Signed)
[facility] Patient Name: Valerie Fisher Procedure Date: 04/01/2021 3:25 PM MRN: [id number] Endoscopist: Gerrit Heck , MD Age: 70 Referring MD:  Date of Birth: Aug 10, 1951 Gender: Female Account #: [id number] Procedure:                Upper GI endoscopy Indications:              Epigastric abdominal pain, Early satiety, Weight                            loss, Decreased appetite Medicines:                Monitored Anesthesia Care Procedure:                Pre-Anesthesia Assessment:                           - Prior to the procedure, a History and Physical                            was performed, and patient medications and                            allergies were reviewed. The patient's tolerance of                            previous anesthesia was also reviewed. The risks                            and benefits of the procedure and the sedation                            options and risks were discussed with the patient.                            All questions were answered, and informed consent                            was obtained. Prior Anticoagulants: The patient has                            taken no previous anticoagulant or antiplatelet                            agents. ASA Grade Assessment: II - A patient with                            mild systemic disease. After reviewing the risks                            and benefits, the patient was deemed in                            satisfactory condition to undergo the procedure.  After obtaining informed consent, the endoscope was                            passed under direct vision. Throughout the                            procedure, the patient's blood pressure, pulse, and                            oxygen saturations were monitored continuously. The                            Endoscope was introduced through the mouth, and                            advanced to the third part of  duodenum. The upper                            GI endoscopy was accomplished without difficulty.                            The patient tolerated the procedure well. Scope In: Scope Out: Findings:                 The examined esophagus was normal.                           Diffuse mild inflammation characterized by                            congestion (edema) and erythema was found in the                            entire examined stomach. Biopsies were taken with a                            cold forceps for histology. Estimated blood loss                            was minimal. The pylorus was patent and easily                            traversed.                           Localized mildly erythematous mucosa without active                            bleeding and with no stigmata of bleeding was found                            in the duodenal bulb. Biopsies were taken with a                            cold  forceps for histology. Estimated blood loss                            was minimal.                           The second portion of the duodenum and third                            portion of the duodenum were normal. Biopsies were                            taken with a cold forceps for histology. Estimated                            blood loss was minimal. Complications:            No immediate complications. Estimated Blood Loss:     Estimated blood loss was minimal. Impression:               - Normal esophagus.                           - Gastritis. Biopsied.                           - Erythematous duodenopathy. Biopsied.                           - Normal second portion of the duodenum and third                            portion of the duodenum. Biopsied. Recommendation:           - Patient has a contact number available for                            emergencies. The signs and symptoms of potential                            delayed complications were discussed with the                             patient. Return to normal activities tomorrow.                            Written discharge instructions were provided to the                            patient.                           - Resume previous diet.                           - Continue present medications.                           -  Await pathology results.                           - Use Protonix (pantoprazole) 40 mg PO BID for 6                            weeks to promote mucosal healing.                           - Return to GI clinic at appointment to be                            scheduled.                           - Colonoscopy today. Gerrit Heck, MD 04/01/2021 4:11:11 PM

## 2021-04-01 NOTE — Progress Notes (Signed)
Report to PACU, RN, vss, BBS= Clear.  

## 2021-04-01 NOTE — Patient Instructions (Signed)
Thank you for allowing [place] to care for you today!  Await final biopsy results, approximately 2 weeks.  Will make further recommendations at that time.  Start prescription Pantoprazole 40 mg by mouth twice per day x 6 weeks , this will aid with with gastric mucosa healing.  Resume other medications and regular diet today.  Return to normal daily activities tomorrow.   YOU HAD AN ENDOSCOPIC PROCEDURE TODAY AT [facility]:   Refer to the procedure report that was given to you for any specific questions about what was found during the examination.  If the procedure report does not answer your questions, please call your gastroenterologist to clarify.  If you requested that your care partner not be given the details of your procedure findings, then the procedure report has been included in a sealed envelope for you to review at your convenience later.  YOU SHOULD EXPECT: Some feelings of bloating in the abdomen. Passage of more gas than usual.  Walking can help get rid of the air that was put into your GI tract during the procedure and reduce the bloating. If you had a lower endoscopy (such as a colonoscopy or flexible sigmoidoscopy) you may notice spotting of blood in your stool or on the toilet paper. If you underwent a bowel prep for your procedure, you may not have a normal bowel movement for a few days.  Please Note:  You might notice some irritation and congestion in your nose or some drainage.  This is from the oxygen used during your procedure.  There is no need for concern and it should clear up in a day or so.  SYMPTOMS TO REPORT IMMEDIATELY:  Following lower endoscopy (colonoscopy or flexible sigmoidoscopy):  Excessive amounts of blood in the stool  Significant tenderness or worsening of abdominal pains  Swelling of the abdomen that is new, acute  Fever of 100F or higher  Following upper endoscopy (EGD)  Vomiting of blood or coffee ground material  New chest pain or  pain under the shoulder blades  Painful or persistently difficult swallowing  New shortness of breath  Fever of 100F or higher  Black, tarry-looking stools  For urgent or emergent issues, a gastroenterologist can be reached at any hour by calling [phone]. Do not use MyChart messaging for urgent concerns.    DIET:  We do recommend a small meal at first, but then you may proceed to your regular diet.  Drink plenty of fluids but you should avoid alcoholic beverages for 24 hours.  ACTIVITY:  You should plan to take it easy for the rest of today and you should NOT DRIVE or use heavy machinery until tomorrow (because of the sedation medicines used during the test).    FOLLOW UP: Our staff will call the number listed on your records 48-72 hours following your procedure to check on you and address any questions or concerns that you may have regarding the information given to you following your procedure. If we do not reach you, we will leave a message.  We will attempt to reach you two times.  During this call, we will ask if you have developed any symptoms of COVID 19. If you develop any symptoms (ie: fever, flu-like symptoms, shortness of breath, cough etc.) before then, please call [phone].  If you test positive for Covid 19 in the 2 weeks post procedure, please call and report this information to [place].    If any biopsies were taken you will  be contacted by phone or by letter within the next 1-3 weeks.  Please call us at [phone] if you have not heard about the biopsies in 3 weeks.    SIGNATURES/CONFIDENTIALITY: You and/or your care partner have signed paperwork which will be entered into your electronic medical record.  These signatures attest to the fact that that the information above on your After Visit Summary has been reviewed and is understood.  Full responsibility of the confidentiality of this discharge information lies with you and/or your care-partner.

## 2021-04-02 ENCOUNTER — Ambulatory Visit (INDEPENDENT_AMBULATORY_CARE_PROVIDER_SITE_OTHER): Payer: Medicare Other | Admitting: Family Medicine

## 2021-04-02 ENCOUNTER — Telehealth: Payer: Self-pay

## 2021-04-02 ENCOUNTER — Ambulatory Visit: Payer: Medicare Other

## 2021-04-02 VITALS — BP 106/61 | HR 72 | Temp 98.2°F

## 2021-04-02 DIAGNOSIS — M810 Age-related osteoporosis without current pathological fracture: Secondary | ICD-10-CM

## 2021-04-02 MED ORDER — DENOSUMAB 60 MG/ML ~~LOC~~ SOSY
60.0000 mg | PREFILLED_SYRINGE | Freq: Once | SUBCUTANEOUS | Status: AC
  Administered 2021-04-02: 60 mg via SUBCUTANEOUS

## 2021-04-02 NOTE — Telephone Encounter (Signed)
Per 04/01/21 procedure report - Return to GI at appt to be scheduled  Patient has been scheduled for a follow up with Dr. Bryan Lemma on Tuesday, 05/12/21 at 11 am. Appt information sent to patient via my chart and a letter has been mailed.

## 2021-04-02 NOTE — Progress Notes (Signed)
Patient presents today as a nurse visit for Prolia 60 mg/1 ml injection. Patient is scheduled to get this injection every 6 months. Patients last injection was given on 09/24/20 in the right arm. Patients last CMP was done on 03/27/21.  Patient denies falls, CP, palpitations, ShOB, dizziness/lightheadedness, abdominal pain, headache, and mood swings.   Injection given in left arm. Pt tolerated injection well without complications. Pt instructed to schedule a nurse visit for the next injection that will be due in 6 months with lab work to be done prior.   Pt scheduled appt for nurse visit on 10/05/2021 to get her next injection in 6 months. Orders to have a CMP with [facility] entered as a future order to be collected approximately 1 week prior. A patient message has been created with a delivery postpone date of 09/16/2021 reminding her to come in for lab work prior to her next injection.

## 2021-04-03 ENCOUNTER — Telehealth: Payer: Self-pay | Admitting: *Deleted

## 2021-04-03 NOTE — Telephone Encounter (Signed)
  Follow up Call-  Call back number 04/01/2021  Post procedure Call Back phone  # [phone]  Permission to leave phone message Yes    First attempt for follow up phone call. No answer at number given.  Left message on voicemail.

## 2021-04-03 NOTE — Telephone Encounter (Signed)
  Follow up Call-  Call back number 04/01/2021  Post procedure Call Back phone  # [phone]  Permission to leave phone message Yes     Patient questions:  Do you have a fever, pain , or abdominal swelling? No. Pain Score  0 *  Have you tolerated food without any problems? Yes.    Have you been able to return to your normal activities? Yes.    Do you have any questions about your discharge instructions: Diet   No. Medications  No. Follow up visit  No.  Do you have questions or concerns about your Care? No.  Actions: * If pain score is 4 or above: No action needed, pain <4.  Have you developed a fever since your procedure? no  2.   Have you had an respiratory symptoms (SOB or cough) since your procedure? no  3.   Have you tested positive for COVID 19 since your procedure no  4.   Have you had any family members/close contacts diagnosed with the COVID 19 since your procedure?  no   If yes to any of these questions please route to Joylene John, RN and Joella Prince, RN

## 2021-04-09 ENCOUNTER — Encounter: Payer: Self-pay | Admitting: Gastroenterology

## 2021-04-13 ENCOUNTER — Ambulatory Visit: Payer: Medicare Other | Admitting: Family Medicine

## 2021-04-14 ENCOUNTER — Ambulatory Visit (INDEPENDENT_AMBULATORY_CARE_PROVIDER_SITE_OTHER): Payer: Medicare Other | Admitting: Family Medicine

## 2021-04-14 ENCOUNTER — Other Ambulatory Visit: Payer: Self-pay

## 2021-04-14 ENCOUNTER — Encounter: Payer: Self-pay | Admitting: Family Medicine

## 2021-04-14 VITALS — BP 116/74 | HR 79 | Ht 60.0 in | Wt 120.1 lb

## 2021-04-14 DIAGNOSIS — S22070D Wedge compression fracture of T9-T10 vertebra, subsequent encounter for fracture with routine healing: Secondary | ICD-10-CM | POA: Diagnosis not present

## 2021-04-14 DIAGNOSIS — K2970 Gastritis, unspecified, without bleeding: Secondary | ICD-10-CM

## 2021-04-14 DIAGNOSIS — Z23 Encounter for immunization: Secondary | ICD-10-CM | POA: Diagnosis not present

## 2021-04-14 DIAGNOSIS — S22000D Wedge compression fracture of unspecified thoracic vertebra, subsequent encounter for fracture with routine healing: Secondary | ICD-10-CM | POA: Insufficient documentation

## 2021-04-14 MED ORDER — LANSOPRAZOLE 30 MG PO CPDR: 30.0000 mg | DELAYED_RELEASE_CAPSULE | Freq: Two times a day (BID) | ORAL | 1 refills | Status: DC

## 2021-04-14 MED ORDER — KETOROLAC TROMETHAMINE 60 MG/2ML IM SOLN
60.0000 mg | Freq: Once | INTRAMUSCULAR | Status: AC
Start: 2021-04-14 — End: 2021-04-14
  Administered 2021-04-14: 60 mg via INTRAMUSCULAR

## 2021-04-14 NOTE — Assessment & Plan Note (Signed)
We did discuss possible referral to orthopedist since her pain is still quite significant but she really wants to give it a few more weeks she says she really wants to focus on getting her stomach a little better and she feels like that would actually even help with her pain levels with her back which I think is completely reasonable.  We will go ahead and give her Toradol 60 mg injection today for pain relief.  Continue to avoid NSAIDs.

## 2021-04-14 NOTE — Addendum Note (Signed)
Addended by: Teddy Spike on: 04/14/2021 05:24 PM   Modules accepted: Orders

## 2021-04-14 NOTE — Progress Notes (Signed)
Established Patient Office Visit  Subjective:  Patient ID: Valerie Fisher, female    DOB: 1950-12-25  Age: 70 y.o. MRN: [id number]  CC:  Chief Complaint  Patient presents with   Follow-up    HPI Valerie Fisher presents for   F/u compression fracture of t10.  She is still having a fair amount of pain on a daily basis she rates it about a 7 out of 10 most days.  But she wants to continue to give it a little bit more time and see if she just continues to get better and try to do some of the stretches and exercises.  She says the Toradol injection that she got last time she was here really helped and would like to know if she could have that done again.  He feels like the Tylenol does not really help.  She did see GI and had an endoscopy she was told that she had diffuse gastritis.  And they have kept her on the pantoprazole 40 mg twice a day and encouraged her to keep on it for at least 6 weeks.  She still having some discomfort and some early satiety.  She reports that they did decrease her Zonegran and she does feel like that is been helpful as well    Past Medical History:  Diagnosis Date   Cancer ([place])    Breast Cancer 2010 right   Fatty liver    GERD (gastroesophageal reflux disease)    History of diverticular abscess of colon 12/12/2017   PONV (postoperative nausea and vomiting)    also slow to wake up   Seizures ([place])    last grand mal seizure was 14 years ago (as of 01/05/21)    Past Surgical History:  Procedure Laterality Date   bladder lift     COLOSTOMY     COLOSTOMY REVERSAL  02/2019   INCISIONAL HERNIA REPAIR N/A 01/07/2021   Procedure: LAPAROSCOPIC INCISIONAL HERNIA WITH MESH;  Surgeon: [name], MD;  Location: [facility];  Service: General;  Laterality: N/A;    Family History  Problem Relation Age of Onset   Diabetes Father    Heart attack Father        mid 59s   Colon cancer Neg Hx    Pancreatic cancer Neg Hx    Stomach cancer Neg Hx    Esophageal  cancer Neg Hx    Liver cancer Neg Hx     Social History   Socioeconomic History   Marital status: Married    Spouse name: BJ   Number of children: 3   Years of education: 20   Highest education level: Master's degree (e.g., MA, MS, MEng, MEd, MSW, MBA)  Occupational History   Occupation: retired Pharmacist, hospital  Tobacco Use   Smoking status: Never   Smokeless tobacco: Never  Vaping Use   Vaping Use: Never used  Substance and Sexual Activity   Alcohol use: Not Currently    Alcohol/week: 0.0 standard drinks   Drug use: Never   Sexual activity: Yes    Partners: Male    Birth control/protection: None  Other Topics Concern   Not on file  Social History Narrative   Lives with her husband. They have three children. They have one dog. In her free time, she enjoys cooking and exercising.    Social Determinants of Health   Financial Resource Strain: Low Risk    Difficulty of Paying Living Expenses: Not hard at all  Food Insecurity: No Food Insecurity  Worried About Charity fundraiser in the Last Year: Never true   [place] in the Last Year: Never true  Transportation Needs: No Transportation Needs   Lack of Transportation (Medical): No   Lack of Transportation (Non-Medical): No  Physical Activity: Sufficiently Active   Days of Exercise per Week: 5 days   Minutes of Exercise per Session: 30 min  Stress: No Stress Concern Present   Feeling of Stress : Not at all  Social Connections: Moderately Isolated   Frequency of Communication with Friends and Family: More than three times a week   Frequency of Social Gatherings with Friends and Family: Once a week   Attends Religious Services: Never   Marine scientist or Organizations: No   Attends Music therapist: Never   Marital Status: Married  Human resources officer Violence: Not At Risk   Fear of Current or Ex-Partner: No   Emotionally Abused: No   Physically Abused: No   Sexually Abused: No    Outpatient  Medications Prior to Visit  Medication Sig Dispense Refill   aspirin-acetaminophen-caffeine (EXCEDRIN MIGRAINE) 250-250-65 MG tablet Take 1 tablet by mouth every 6 (six) hours as needed for headache.     calcitonin, salmon, (MIACALCIN) 200 UNIT/ACT nasal spray Place 1 spray into alternate nostrils daily. 3.7 mL 1   Calcium Carbonate-Vit D-Min (CALCIUM 1200 PO) Take 1 tablet by mouth daily.     Cholecalciferol (VITAMIN D) 125 MCG (5000 UT) CAPS Take 5,000 Units by mouth daily.     denosumab (PROLIA) 60 MG/ML SOSY injection Inject 60 mg into the skin every 6 (six) months.     fluticasone (FLONASE) 50 MCG/ACT nasal spray Place 1 spray into both nostrils daily as needed for allergies or rhinitis.     levETIRAcetam (KEPPRA) 500 MG tablet Take 500-1,000 mg by mouth See admin instructions. Take 500 mg in the morning and 1000 mg in the evening  3   Melatonin 5 MG CAPS Take 5 mg by mouth at bedtime as needed (sleep).     Multiple Vitamin (MULTIVITAMIN ADULT PO) Take 2 each by mouth daily.     sertraline (ZOLOFT) 100 MG tablet Take 1 tablet (100 mg total) by mouth daily. 30 tablet 0   zonisamide (ZONEGRAN) 100 MG capsule Take 400 mg by mouth at bedtime.  1   pantoprazole (PROTONIX) 40 MG tablet Take 1 tablet (40 mg total) by mouth 2 (two) times daily before a meal. 45 tablet 0   Facility-Administered Medications Prior to Visit  Medication Dose Route Frequency Provider Last Rate Last Admin   0.9 %  sodium chloride infusion  500 mL Intravenous Continuous Cirigliano, Vito V, DO        Allergies  Allergen Reactions   Aspirin Other (See Comments)    GI upset    Hydrocodone-Acetaminophen Nausea And Vomiting   Hydromorphone Hcl Nausea And Vomiting   Morphine Nausea And Vomiting   Pseudoephedrine Hcl Nausea And Vomiting   Other Nausea Only    General anesthesia    Zofran [Ondansetron] Other (See Comments)    Headache    ROS Review of Systems    Objective:    Physical Exam  BP 116/74   Pulse  79   Ht 5' (1.524 m)   Wt 120 lb 1.9 oz (54.5 kg)   SpO2 99%   BMI 23.46 kg/m  Wt Readings from Last 3 Encounters:  04/14/21 120 lb 1.9 oz (54.5 kg)  04/01/21 124 lb (  56.2 kg)  03/16/21 121 lb (54.9 kg)     Health Maintenance Due  Topic Date Due   Zoster Vaccines- Shingrix (1 of 2) Never done   COVID-19 Vaccine (4 - Booster for Pfizer series) 02/07/2020    There are no preventive care reminders to display for this patient.  Lab Results  Component Value Date   TSH 1.650 09/12/2019   Lab Results  Component Value Date   WBC 7.7 01/08/2021   HGB 12.4 01/08/2021   HCT 37.9 01/08/2021   MCV 89.6 01/08/2021   PLT 260 01/08/2021   Lab Results  Component Value Date   NA 142 03/27/2021   K 4.9 03/27/2021   CO2 22 03/27/2021   GLUCOSE 96 03/27/2021   BUN 25 03/27/2021   CREATININE 0.75 03/27/2021   BILITOT 0.4 03/27/2021   ALKPHOS 86 03/27/2021   AST 15 03/27/2021   ALT 9 03/27/2021   PROT 7.1 03/27/2021   ALBUMIN 4.9 (H) 03/27/2021   CALCIUM 9.8 03/27/2021   ANIONGAP 7 01/08/2021   EGFR 85 03/27/2021   Lab Results  Component Value Date   CHOL 246 (H) 09/12/2019   Lab Results  Component Value Date   HDL 88 09/12/2019   Lab Results  Component Value Date   LDLCALC 134 (H) 09/12/2019   Lab Results  Component Value Date   TRIG 138 09/12/2019   Lab Results  Component Value Date   CHOLHDL 2.8 09/12/2019   No results found for: HGBA1C    Assessment & Plan:   Problem List Items Addressed This Visit       Musculoskeletal and Integument   Compression fracture of thoracic vertebra with routine healing - Primary    We did discuss possible referral to orthopedist since her pain is still quite significant but she really wants to give it a few more weeks she says she really wants to focus on getting her stomach a little better and she feels like that would actually even help with her pain levels with her back which I think is completely reasonable.  We will go  ahead and give her Toradol 60 mg injection today for pain relief.  Continue to avoid NSAIDs.      Other Visit Diagnoses     Need for immunization against influenza       Relevant Orders   Flu Vaccine QUAD High Dose(Fluad) (Completed)   Gastritis without bleeding, unspecified chronicity, unspecified gastritis type       Relevant Medications   lansoprazole (PREVACID) 30 MG capsule      Gastritis-discussed switching to a different PPI working to try lansoprazole 30 mg twice daily instead and see if she feels like that is working a little bit better if she does feel like it is more effective than continue for at least 6 weeks for full healing.  If not okay to switch back to the pantoprazole.  Meds ordered this encounter  Medications   lansoprazole (PREVACID) 30 MG capsule    Sig: Take 1 capsule (30 mg total) by mouth 2 (two) times daily before a meal.    Dispense:  60 capsule    Refill:  1    Follow-up: No follow-ups on file.    Beatrice Lecher, MD

## 2021-04-16 ENCOUNTER — Telehealth: Payer: Self-pay | Admitting: Gastroenterology

## 2021-04-16 NOTE — Telephone Encounter (Signed)
Spoke with patient, advised that certain medications can cause gastritis along with certain foods. Advised patient that she can try avoiding foods that are spicy, acidic, citrus, or raw vegetables for a little while. She states that she did notice that broccoli an cauliflower made her have pain. Advised that she can eat cooked vegetables but nothing raw right now. She is aware of her follow up appt in [place] later this month. Pt verbalized understanding and had no concerns at the end of the call.

## 2021-04-16 NOTE — Telephone Encounter (Signed)
Pt wants to know the cause of gastritis so she can adjust her diet. Pls call her.

## 2021-05-04 ENCOUNTER — Other Ambulatory Visit: Payer: Self-pay | Admitting: Family Medicine

## 2021-05-04 DIAGNOSIS — F411 Generalized anxiety disorder: Secondary | ICD-10-CM

## 2021-05-12 ENCOUNTER — Other Ambulatory Visit: Payer: Self-pay

## 2021-05-12 ENCOUNTER — Encounter: Payer: Self-pay | Admitting: Gastroenterology

## 2021-05-12 ENCOUNTER — Ambulatory Visit (INDEPENDENT_AMBULATORY_CARE_PROVIDER_SITE_OTHER): Payer: Medicare Other | Admitting: Gastroenterology

## 2021-05-12 VITALS — BP 118/78 | HR 86 | Ht 60.0 in | Wt 122.2 lb

## 2021-05-12 DIAGNOSIS — R634 Abnormal weight loss: Secondary | ICD-10-CM | POA: Diagnosis not present

## 2021-05-12 DIAGNOSIS — R112 Nausea with vomiting, unspecified: Secondary | ICD-10-CM

## 2021-05-12 DIAGNOSIS — R1115 Cyclical vomiting syndrome unrelated to migraine: Secondary | ICD-10-CM | POA: Diagnosis not present

## 2021-05-12 DIAGNOSIS — K2970 Gastritis, unspecified, without bleeding: Secondary | ICD-10-CM

## 2021-05-12 DIAGNOSIS — G8929 Other chronic pain: Secondary | ICD-10-CM | POA: Diagnosis not present

## 2021-05-12 DIAGNOSIS — K2990 Gastroduodenitis, unspecified, without bleeding: Secondary | ICD-10-CM

## 2021-05-12 DIAGNOSIS — R1013 Epigastric pain: Secondary | ICD-10-CM

## 2021-05-12 DIAGNOSIS — G43D0 Abdominal migraine, not intractable: Secondary | ICD-10-CM

## 2021-05-12 DIAGNOSIS — R630 Anorexia: Secondary | ICD-10-CM

## 2021-05-12 DIAGNOSIS — K582 Mixed irritable bowel syndrome: Secondary | ICD-10-CM | POA: Diagnosis not present

## 2021-05-12 MED ORDER — SUMATRIPTAN 20 MG/ACT NA SOLN: 20.0000 mg | NASAL | 2 refills | Status: DC | PRN

## 2021-05-12 NOTE — Patient Instructions (Signed)
If you are age 72 or older, your body mass index should be between 23-30. Your Body mass index is 23.88 kg/m. If this is out of the aforementioned range listed, please consider follow up with your Primary Care Provider.  If you are age 6 or younger, your body mass index should be between 19-25. Your Body mass index is 23.88 kg/m. If this is out of the aformentioned range listed, please consider follow up with your Primary Care Provider.    Please purchase the following medications over the counter and take as directed:  FD Guard  We have sent the following medications to your pharmacy for you to pick up at your convenience:  Imitrex 20 mg spray  Due to recent changes in healthcare laws, you may see the results of your imaging and laboratory studies on MyChart before your provider has had a chance to review them.  We understand that in some cases there may be results that are confusing or concerning to you. Not all laboratory results come back in the same time frame and the provider may be waiting for multiple results in order to interpret others.  Please give [place] 48 hours in order for your provider to thoroughly review all the results before contacting the office for clarification of your results.   Thank you for choosing me and [facility].  Vito Cirigliano, D.O.

## 2021-05-12 NOTE — Progress Notes (Signed)
Chief Complaint:    Procedure follow-up, Epigastric pain  GI History: 70 year old female follows in the GI clinic for the following:  1) Epigastric pain.  Started in 09/2020. Pain tends to be worse after eating, but can also have pain with fasting.  + Early satiety and decreased appetite but no sitophobia per se.  Has lost 20# since symptoms onset due to early satiety.  Had trialed course of Protonix, but did not tolerate well (increased abdominal pain, nausea/vomiting, wt loss). - 09/2020: CT abdomen/pelvis: Fatty liver, diverticulosis, herniation of loop of mid transverse colon in anterior abdominal wall hernia without incarceration.  Single loop of small bowel extends into anterior abdominal wall hernia as well. -12/2020: laparoscopic incisional hernia repair with mesh - 12/2020: Normal CBC, CMP - 03/2021: EGD (gastritis, peptic duodenitis) and colonoscopy (unremarkable)  2) IBS Mixed type.Index symptoms of alternating bowel habits and abdominal cramping.  Previously followed with [facility], last seen on 04/06/2019.  Previously treated with Bentyl, fiber supplement, low FODMAP diet, along with MiraLAX as needed.  - 02/2021: IBS symptoms well controlled  3) History of diverticulitis. bowel resection with ~22 cm colon removed and colostomy in 12/1023 for complicated diverticulitis, with reversal in 02/2019.  Endoscopic History: - Colonoscopy (03/2013): Descending and sigmoid diverticulosis, small internal hemorrhoids - EGD (03/2021): Mild gastritis and duodenitis in bulb, otherwise normal.  Started on Protonix 40 mg bid x6 weeks - Colonoscopy (03/2021): Healthy colonic anastomosis in rectum, pandiverticulosis, 3 mm benign sigmoid polyp, small internal hemorrhoids, normal TI.  No specific repeat for screening due to age >63  HPI:     Patient is a 70 y.o. female presenting to the Gastroenterology Clinic for follow-up.  Was initially seen by me in the GI clinic on 03/09/2021 for evaluation of  epigastric pain, weight loss, decreased appetite, early satiety, and for ongoing CRC screening.  EGD/colonoscopy completed last month as outlined above.  Was started on Protonix but no improvement in symptoms.  Exchanged for lansoprazole by PCM, and still no improvement, so she stopped this as well.  Otherwise no new labs or abdominal imaging for review today.  Today, states still with reduced appetite and nausea/vomiting. Nausea as soon as she wakes, and tends to improve through the day. Still with epigastric pain which can be independent of nausea/vomiting.  Weight has stabilized since last appointment.  Only uses APAP prn (rarely) for headache. No NsIADs.  Follows with Neurology at [facility] for history of absence epilepsy, currently on Topamax and Keppra.  Review of systems:     No chest pain, no SOB, no fevers, no urinary sx   Past Medical History:  Diagnosis Date   Cancer ([place])    Breast Cancer 2010 right   Fatty liver    GERD (gastroesophageal reflux disease)    History of diverticular abscess of colon 12/12/2017   PONV (postoperative nausea and vomiting)    also slow to wake up   Seizures ([place])    last grand mal seizure was 14 years ago (as of 01/05/21)    Patient's surgical history, family medical history, social history, medications and allergies were all reviewed in Epic    Current Outpatient Medications  Medication Sig Dispense Refill   aspirin-acetaminophen-caffeine (EXCEDRIN MIGRAINE) 250-250-65 MG tablet Take 1 tablet by mouth every 6 (six) hours as needed for headache.     calcitonin, salmon, (MIACALCIN) 200 UNIT/ACT nasal spray Place 1 spray into alternate nostrils daily. 3.7 mL 1   Calcium Carbonate-Vit D-Min (CALCIUM 1200  PO) Take 1 tablet by mouth daily.     Cholecalciferol (VITAMIN D) 125 MCG (5000 UT) CAPS Take 5,000 Units by mouth daily.     denosumab (PROLIA) 60 MG/ML SOSY injection Inject 60 mg into the skin every 6 (six) months.     fluticasone (FLONASE) 50  MCG/ACT nasal spray Place 1 spray into both nostrils daily as needed for allergies or rhinitis.     lansoprazole (PREVACID) 30 MG capsule Take 1 capsule (30 mg total) by mouth 2 (two) times daily before a meal. 60 capsule 1   levETIRAcetam (KEPPRA) 500 MG tablet Take 500-1,000 mg by mouth See admin instructions. Take 500 mg in the morning and 1000 mg in the evening  3   Melatonin 5 MG CAPS Take 5 mg by mouth at bedtime as needed (sleep).     Multiple Vitamin (MULTIVITAMIN ADULT PO) Take 2 each by mouth daily.     sertraline (ZOLOFT) 100 MG tablet Take 1 tablet (100 mg total) by mouth daily. 30 tablet 0   zonisamide (ZONEGRAN) 100 MG capsule Take 400 mg by mouth at bedtime.  1   Current Facility-Administered Medications  Medication Dose Route Frequency Provider Last Rate Last Admin   0.9 %  sodium chloride infusion  500 mL Intravenous Continuous Darneisha Windhorst V, DO        Physical Exam:     There were no vitals taken for this visit.  GENERAL:  Pleasant female in NAD PSYCH: : Cooperative, normal affect Musculoskeletal:  Normal muscle tone, normal strength NEURO: Alert and oriented x 3, no focal neurologic deficits   IMPRESSION and PLAN:    1) Nausea/Vomiting 2) Epigastric pain/Dyspepsia 3) Weight loss-stabilized 4) Decreased appetite  Discussed broad DDx with plan for the following: - EGD with mild non-H. pylori gastritis.  No improvement with trial of PPI therapy - Trial of Imitrex for possible intestinal migraine/CVS - Trial course of FD guard -Possibly medication side effect.         - Started Topamax in 05/2020: nausea 7-13%, anorexia 4-15%, dysguesia 3-15%, abdominal pain 6-15%, dyspepsia 4-5%, gastritis 3%          - Keppra carries GI ADR profile as well          - Prolia: Nausea 32%, abdominal pain 3%, vomiting 3%          -Zonegran: Anorexia 13%, nausea 9%, abdominal pain 6%, weight loss 3%, dysguesia 2%           -Zoloft: Nausea 26%, abdominal pain 5%, decreased  appetite 7%, dyspepsia 8%, vomiting 4%  - If no improvement, discussed possible additional work-up/treatment options as below:       -GES       -CT angiography to evaluate for vascular etiology       -SIBO/IMO breath testing and trial course of probiotics - Patient to discuss with Neurologist about current medications   5) Diverticulosis with history of complicated diverticulitis s/p hemicolectomy 6) IBS - IBS symptoms well controlled - Healthy-appearing colocolonic anastomosis on recent colonoscopy  RTC in 2 months or sooner as needed  I spent 35 minutes of time, including in depth chart review, independent review of results as outlined above, communicating results with the patient directly, face-to-face time with the patient, coordinating care, and ordering studies and medications as appropriate, and documentation.           Lavena Bullion ,DO, FACG 05/12/2021, 10:52 AM

## 2021-06-08 ENCOUNTER — Ambulatory Visit: Payer: Medicare Other | Admitting: Internal Medicine

## 2021-06-11 ENCOUNTER — Ambulatory Visit (INDEPENDENT_AMBULATORY_CARE_PROVIDER_SITE_OTHER): Payer: Medicare Other | Admitting: Pharmacist

## 2021-06-11 ENCOUNTER — Other Ambulatory Visit: Payer: Self-pay

## 2021-06-11 DIAGNOSIS — F411 Generalized anxiety disorder: Secondary | ICD-10-CM

## 2021-06-11 DIAGNOSIS — K2970 Gastritis, unspecified, without bleeding: Secondary | ICD-10-CM

## 2021-06-11 DIAGNOSIS — M810 Age-related osteoporosis without current pathological fracture: Secondary | ICD-10-CM

## 2021-06-11 DIAGNOSIS — R1013 Epigastric pain: Secondary | ICD-10-CM

## 2021-06-11 NOTE — Progress Notes (Signed)
Chronic Care Management Pharmacy Note  06/12/2021 Name:  Valerie Fisher MRN:  [id number] DOB:  1951/05/14  Summary: addressed mental health, osteoporosis, epilepsy, epigastric pain. Still working with GI for stomach pain workup. She self-reduced sertraline to 61m and has been at this dose for about 1 month.  Recommendations/Changes made from today's visit: none, recommended she discuss w/PCP if her goal is to come off sertraline altogether.   Plan: f/u with pharmacist in 6 months  Subjective: BKeishawna Carranzais an 70y.o. year old female who is a primary patient of Valerie Fisher, CRene Kocher MD.  The CCM team was consulted for assistance with disease management and care coordination needs.    Engaged with patient by telephone for follow up visit in response to provider referral for pharmacy case management and/or care coordination services.   Consent to Services:  The patient was given information about Chronic Care Management services, agreed to services, and gave verbal consent prior to initiation of services.  Please see initial visit note for detailed documentation.   Patient Care Team: MHali Marry MD as PCP - General (Family Medicine) KDarius [facility] Pharmacist (Pharmacist)   Objective:  Lab Results  Component Value Date   CREATININE 0.75 03/27/2021   CREATININE 0.68 01/08/2021   CREATININE 0.80 01/07/2021       Component Value Date/Time   CHOL 246 (H) 09/12/2019 1321   TRIG 138 09/12/2019 1321   HDL 88 09/12/2019 1321   CHOLHDL 2.8 09/12/2019 1321   CHOLHDL 2.9 09/07/2019 1434   LDLCALC 134 (H) 09/12/2019 1321   LDLCALC 123 (H) 09/07/2019 1434   [place]ED 09/07/2019 1434    Hepatic Function Latest Ref Rng & Units 03/27/2021 01/07/2021 09/22/2020  Total Protein 6.0 - 8.5 g/dL 7.1 6.7 7.2  Albumin 3.7 - 4.7 g/dL 4.9(H) 4.0 4.8(H)  AST 0 - 40 IU/L _0 ALT 0 - 32 IU/L _1 Alk Phosphatase 44 - 121 IU/L 86 43 58  Total Bilirubin 0.0 -  1.2 mg/dL 0.4 1.0 0.4    Lab Results  Component Value Date/Time   TSH 1.650 09/12/2019 01:21 PM    CBC Latest Ref Rng & Units 01/08/2021 01/07/2021 09/22/2020  WBC 4.0 - 10.5 K/uL 7.7 5.2 5.1  Hemoglobin 12.0 - 15.0 g/dL 12.4 14.7 15.2  Hematocrit 36.0 - 46.0 % 37.9 45.3 45.6  Platelets 150 - 400 K/uL 260 302 327    Social History   Tobacco Use  Smoking Status Never  Smokeless Tobacco Never   BP Readings from Last 3 Encounters:  05/12/21 118/78  04/14/21 116/74  04/02/21 106/61   Pulse Readings from Last 3 Encounters:  05/12/21 86  04/14/21 79  04/02/21 72   Wt Readings from Last 3 Encounters:  05/12/21 122 lb 4 oz (55.5 kg)  04/14/21 120 lb 1.9 oz (54.5 kg)  04/01/21 124 lb (56.2 kg)    Assessment: Review of patient past medical history, allergies, medications, health status, including review of consultants reports, laboratory and other test data, was performed as part of comprehensive evaluation and provision of chronic care management services.   SDOH:  (Social Determinants of Health) assessments and interventions performed:    CCM Care Plan  Allergies  Allergen Reactions   Aspirin Other (See Comments)    GI upset    Hydrocodone-Acetaminophen Nausea And Vomiting   Hydromorphone Hcl Nausea And Vomiting   Morphine Nausea And Vomiting   Pseudoephedrine Hcl Nausea And Vomiting  Other Nausea Only    General anesthesia    Zofran [Ondansetron] Other (See Comments)    Headache    Medications Reviewed Today     Reviewed by Valerie Fisher, Valerie Fisher (Pharmacist) on 06/11/21 at 1135  Med List Status: <None>   Medication Order Taking? Sig Documenting Provider Last Dose Status Informant  0.9 %  sodium chloride infusion [id number]   Valerie Fisher, Valerie V, DO  Active   aspirin-acetaminophen-caffeine (EXCEDRIN MIGRAINE) 250-250-65 MG tablet [id number] No Take 1 tablet by mouth every 6 (six) hours as needed for headache.  Patient not taking: Reported on 06/11/2021   [provider] Not Taking Active Self  Calcium Carbonate-Vit D-Min (CALCIUM 1200 PO) [id number] Yes Take 1 tablet by mouth daily. [provider] Taking Active   Cholecalciferol (VITAMIN D) 125 MCG (5000 UT) CAPS [id number] Yes Take 5,000 Units by mouth daily. [provider] Taking Active   denosumab (PROLIA) 60 MG/ML SOSY injection [id number] Yes Inject 60 mg into the skin every 6 (six) months. [provider] Taking Active Self           Med Note Valerie Fisher Feb 17, 2021  1:53 PM) Due August 2022  fluticasone [facility]) 50 MCG/ACT nasal spray [id number] Yes Place 1 spray into both nostrils daily as needed for allergies or rhinitis. [provider] Taking Active Self  lansoprazole (PREVACID) 30 MG capsule [id number] Yes Take 1 capsule (30 mg total) by mouth 2 (two) times daily before a meal. Valerie Marry, MD Taking Active   levETIRAcetam (KEPPRA) 500 MG tablet [id number] Yes Take 500-1,000 mg by mouth See admin instructions. Take 500 mg in the morning and 1000 mg in the evening Valerie Morse, MD Taking Active Self           Med Note Valerie Fisher   Tue Feb 17, 2021  1:54 PM) Plan to wean off keppra fall 2022  Melatonin 5 MG CAPS [id number] Yes Take 5 mg by mouth at bedtime as needed (sleep). [provider] Taking Active Self  Multiple Vitamin (MULTIVITAMIN ADULT PO) [id number] Yes Take 2 each by mouth daily. [provider] Taking Active            Med Note Valerie Fisher   Tue Feb 17, 2021  2:01 PM) Vitafusion gummies  OVER THE COUNTER MEDICATION [id number] Yes Take 2 capsules by mouth 3 (three) times daily before meals. [provider] Taking Active   sertraline (ZOLOFT) 100 MG tablet [id number] Yes Take 1 tablet (100 mg total) by mouth daily.  Patient taking differently: Take 50 mg by mouth daily.   Valerie Marry, MD Taking Active   SUMAtriptan [facility]) 20 MG/ACT nasal spray [id number] No Place 1 spray (20 mg  total) into the nose every 2 (two) hours as needed for migraine. May repeat in 2 hours if headache persists or recurs.  Patient not taking: Reported on 06/11/2021   Lavena Bullion, DO Not Taking Active   zonisamide (ZONEGRAN) 100 MG capsule [id number] Yes Take 300 mg by mouth at bedtime. Valerie Morse, MD Taking Active Self            Patient Active Problem List   Diagnosis Date Noted   Compression fracture of thoracic vertebra with routine healing 04/14/2021   Incisional hernia 01/07/2021   Fatty liver 10/07/2020   Calculus of gallbladder without cholecystitis without obstruction 10/07/2020   Insomnia 05/07/2019   Right leg pain 05/07/2019  GAD (generalized anxiety disorder) 03/05/2019   History of right breast cancer 03/05/2019   H/O colectomy 02/15/2019   DDD (degenerative disc disease) 01/18/2019   History of diverticular abscess of colon 12/12/2017   Auditory complaints of both ears 04/04/2016   Osteoporosis 03/28/2015   Therapeutic drug monitoring 01/24/2014   Absence epilepsy, not intractable, without status epilepticus ([place]) 05/15/2013   Generalized nonconvulsive epilepsy ([place]) 05/15/2013   Rotator cuff tendonitis 04/23/2013   Internal hemorrhoids 03/30/2013    Immunization History  Administered Date(s) Administered   Fluad Quad(high Dose 65+) 05/02/2019, 05/30/2020, 04/14/2021   Influenza, High Dose Seasonal PF 06/03/2018   Influenza, Seasonal, Injecte, Preservative Fre 05/17/2015   PFIZER(Purple Top)SARS-COV-2 Vaccination 09/16/2019, 10/14/2019, 11/07/2019   Pneumococcal Conjugate-13 08/26/2014   Pneumococcal Polysaccharide-23 09/08/2015   Tdap 07/26/2008, 08/04/2018   Zoster, Live 08/15/2014    Conditions to be addressed/monitored: Anxiety, osteoporosis, epilepsy  Care Plan : Medication Management  Updates made by Valerie Fisher, [place] since 06/12/2021 12:00 AM     Problem: Depression/Anxiety, Osteoporosis, Epilepsy, Epigastric Pain      Long-Range  Goal: Disease Progression Prevention   Start Date: 02/17/2021  Recent Progress: On track  Priority: High  Note:   Current Barriers:  None at present Pt does report not taking carafate or pantoprazole d/t suspected side effects, advised by specialist can hold off on these for now to see if resolves  Pharmacist Clinical Goal(s):  Over the next 180 days, patient will maintain control of chronic conditions as evidenced by medication fill history, lab values, and vital signs  through collaboration with PharmD and provider.   Interventions: 1:1 collaboration with Valerie Marry, MD regarding development and update of comprehensive plan of care as evidenced by provider attestation and co-signature Inter-disciplinary care team collaboration (see longitudinal plan of care) Comprehensive medication review performed; medication list updated in electronic medical record  Depression/Anxiety:  Controlled; current treatment:sertraline 190m daily prescribed  but self-reduced to 587mdaily;   Recommended continue current regimen and discuss w/ PCP if her goal is to come off sertraline altogether,  Osteoporosis:  Current treatment:prolia Q6m73month  Last DEXA: 08/24/2019, t score -2.5  Supplementation: calcium carbonate 1200m64mvit D 5000IU daily  Epilepsy  Controlled; current treatment:keppra 500mg35m 1000mg 29mand zonisamide 400mg a98mdtime;   Managed by neurology, titrating off keprra d/t patient reports of feeling foggy/spacey   Recommended continue current regimen, Epigastric Pain  Controlled; current treatment: prevacid;   Managed by GI  Recommended continue current regimen  Patient Goals/Self-Care Activities Over the next 180 days, patient will:  take medications as prescribed  Follow Up Plan: Telephone follow up appointment with care management team member scheduled for:  6 months       Medication Assistance: None required.  Patient affirms current coverage meets  needs.  Patient's preferred pharmacy is:  [facility] [phone]  ([facility] [address] [place] [phone] [phone]? Yes Pt endorses 100% compliance  Follow Up:  Patient agrees to Care Plan and Follow-up.  Plan: Telephone follow up appointment with care management team member scheduled for:  6 months  Ameia Morency Valerie Fisher

## 2021-06-12 NOTE — Patient Instructions (Signed)
Visit Information  PATIENT GOALS:  Goals Addressed             This Visit's Progress    Medication Management       Patient Goals/Self-Care Activities Over the next 180 days, patient will:  take medications as prescribed  Follow Up Plan: Telephone follow up appointment with care management team member scheduled for:  6 months         Patient verbalizes understanding of instructions provided today and agrees to view in [place].   Telephone follow up appointment with care management team member scheduled for: 6 months

## 2021-06-15 DIAGNOSIS — M810 Age-related osteoporosis without current pathological fracture: Secondary | ICD-10-CM

## 2021-07-20 ENCOUNTER — Ambulatory Visit: Payer: Medicare Other | Admitting: Internal Medicine

## 2021-07-20 ENCOUNTER — Encounter: Payer: Self-pay | Admitting: Internal Medicine

## 2021-07-20 ENCOUNTER — Other Ambulatory Visit: Payer: Self-pay

## 2021-07-20 VITALS — BP 106/72 | HR 80 | Ht 60.0 in | Wt 124.0 lb

## 2021-07-20 DIAGNOSIS — M549 Dorsalgia, unspecified: Secondary | ICD-10-CM | POA: Diagnosis not present

## 2021-07-20 DIAGNOSIS — E875 Hyperkalemia: Secondary | ICD-10-CM | POA: Diagnosis not present

## 2021-07-20 DIAGNOSIS — M8000XS Age-related osteoporosis with current pathological fracture, unspecified site, sequela: Secondary | ICD-10-CM | POA: Diagnosis not present

## 2021-07-20 LAB — BASIC METABOLIC PANEL
BUN: 28 mg/dL — ABNORMAL HIGH (ref 6–23)
CO2: 24 mEq/L (ref 19–32)
Calcium: 9.8 mg/dL (ref 8.4–10.5)
Chloride: 107 mEq/L (ref 96–112)
Creatinine, Ser: 0.71 mg/dL (ref 0.40–1.20)
GFR: 84.77 mL/min (ref >60.00)
Glucose, Bld: 86 mg/dL (ref 70–99)
Potassium: 5.4 mEq/L — ABNORMAL HIGH (ref 3.5–5.1)
Sodium: 139 mEq/L (ref 135–145)

## 2021-07-20 LAB — TSH: TSH: 1.52 u[IU]/mL (ref 0.35–5.50)

## 2021-07-20 LAB — VITAMIN D 25 HYDROXY (VIT D DEFICIENCY, FRACTURES): VITD: 53.33 ng/mL (ref 30.00–100.00)

## 2021-07-20 LAB — T4, FREE: Free T4: 0.71 ng/dL (ref 0.60–1.60)

## 2021-07-20 NOTE — Patient Instructions (Signed)
Continue calcium 1200 mg daily  Continue Vitamin D 5000 iu daily      Will discuss treatment plan after your bone density

## 2021-07-20 NOTE — Progress Notes (Signed)
Name: Valerie Fisher  MRN/ DOB: [id number], 1951-02-28    Age/ Sex: 70 y.o., female    PCP: Hali Marry, MD   Reason for Endocrinology Evaluation: Osteoporosis      Date of Initial Endocrinology Evaluation: 07/20/2021     HPI: Valerie Fisher is a 70 y.o. female with a past medical history of epilepsy, Hx of breast Cancer ( 2010) S/P lumpectomy and radiation . The patient presented for initial endocrinology clinic visit on 07/20/2021 for consultative assistance with her Osteoporosis .   Pt was diagnosed with osteoporosis:08/2019 with a T-score of -2.5 at the [facility]   Menarche at age : 9 Menopausal at age : 40 Fracture Hx: compression fracture of T10 (dx 02/2021), [place] off a bar stool  Hx of HRT: no FH of osteoporosis or hip fracture: mother  Prior Hx of anti-estrogenic therapy : yes - around 2011 she took it for 1 yr only (Tamoxifen )  Prior Hx of anti-resorptive therapy : Prolia started 09/20/2019, 03/2020, 09/2020, 03/2021   She continues with back pain , has not seen a neurosurgeon , as she deferred surgical interventional    Calcium 1200 mg daily  Vitamin D 5000 iu daily       HISTORY:  Past Medical History:  Past Medical History:  Diagnosis Date   Cancer ([place])    Breast Cancer 2010 right   Fatty liver    GERD (gastroesophageal reflux disease)    History of diverticular abscess of colon 12/12/2017   PONV (postoperative nausea and vomiting)    also slow to wake up   Seizures ([place])    last grand mal seizure was 14 years ago (as of 01/05/21)   Past Surgical History:  Past Surgical History:  Procedure Laterality Date   bladder lift     COLOSTOMY     COLOSTOMY REVERSAL  02/2019   INCISIONAL HERNIA REPAIR N/A 01/07/2021   Procedure: LAPAROSCOPIC INCISIONAL HERNIA WITH MESH;  Surgeon: [name], MD;  Location: [place];  Service: General;  Laterality: N/A;    Social History:  reports that she has never smoked. She has never used smokeless tobacco.  She reports that she does not currently use alcohol. She reports that she does not use drugs. Family History: family history includes Diabetes in her father; Heart attack in her father.   HOME MEDICATIONS: Allergies as of 07/20/2021       Reactions   Aspirin Other (See Comments)   GI upset   Hydrocodone-acetaminophen Nausea And Vomiting   Hydromorphone Hcl Nausea And Vomiting   Morphine Nausea And Vomiting   Pseudoephedrine Hcl Nausea And Vomiting   Other Nausea Only   General anesthesia    Zofran [ondansetron] Other (See Comments)   Headache        Medication List        Accurate as of July 20, 2021  4:34 PM. If you have any questions, ask your nurse or doctor.          STOP taking these medications    lansoprazole 30 MG capsule Commonly known as: Prevacid Stopped by: Dorita Sciara, MD       TAKE these medications    aspirin-acetaminophen-caffeine 250-250-65 MG tablet Commonly known as: EXCEDRIN MIGRAINE Take 1 tablet by mouth every 6 (six) hours as needed for headache.   CALCIUM 1200 PO Take 1 tablet by mouth daily.   fluticasone 50 MCG/ACT nasal spray Commonly known as: FLONASE Place 1 spray into both  nostrils daily as needed for allergies or rhinitis.   levETIRAcetam 500 MG tablet Commonly known as: KEPPRA Take 500-1,000 mg by mouth See admin instructions. Take 500 mg in the morning and 1000 mg in the evening   Melatonin 5 MG Caps Take 5 mg by mouth at bedtime as needed (sleep).   MULTIVITAMIN ADULT PO Take 2 each by mouth daily.   OVER THE COUNTER MEDICATION Take 2 capsules by mouth 3 (three) times daily before meals.   Prolia 60 MG/ML Sosy injection Generic drug: denosumab Inject 60 mg into the skin every 6 (six) months.   sertraline 100 MG tablet Commonly known as: ZOLOFT Take 1 tablet (100 mg total) by mouth daily. What changed: how much to take   SUMAtriptan 20 MG/ACT nasal spray Commonly known as: Imitrex Place 1 spray  (20 mg total) into the nose every 2 (two) hours as needed for migraine. May repeat in 2 hours if headache persists or recurs.   Vitamin D 125 MCG (5000 UT) Caps Take 5,000 Units by mouth daily.   zonisamide 100 MG capsule Commonly known as: ZONEGRAN Take 300 mg by mouth at bedtime.          REVIEW OF SYSTEMS: A comprehensive ROS was conducted with the patient and is negative except as per HPI     OBJECTIVE:  VS: BP 106/72 (BP Location: Left Arm, Patient Position: Sitting, Cuff Size: Small)   Pulse 80   Ht 5' (1.524 m)   Wt 124 lb (56.2 kg)   SpO2 99%   BMI 24.22 kg/m    Wt Readings from Last 3 Encounters:  07/20/21 124 lb (56.2 kg)  05/12/21 122 lb 4 oz (55.5 kg)  04/14/21 120 lb 1.9 oz (54.5 kg)     EXAM: General: Pt appears well and is in NAD  Neck: General: Supple without adenopathy. Thyroid: Thyroid size normal.  No goiter or nodules appreciated.  Lungs: Clear with good BS bilat with no rales, rhonchi, or wheezes  Heart: Auscultation: RRR.  Abdomen: Normoactive bowel sounds, soft, nontender, without masses or organomegaly palpable  Extremities:  BL LE: No pretibial edema normal ROM and strength.  Mental Status: Judgment, insight: Intact Orientation: Oriented to time, place, and person Memory: Intact for recent and remote events Mood and affect: No depression, anxiety, or agitation     DATA REVIEWED:     Latest Reference Range & Units 07/20/21 14:40  Sodium 135 - 145 mEq/L 139  Potassium 3.5 - 5.1 mEq/L 5.4 No hemolysis seen (H)  Chloride 96 - 112 mEq/L 107  CO2 19 - 32 mEq/L 24  Glucose 70 - 99 mg/dL 86  BUN 6 - 23 mg/dL 28 (H)  Creatinine 0.40 - 1.20 mg/dL 0.71  Calcium 8.4 - 10.5 mg/dL 9.8  GFR >60.00 mL/min 84.77    Latest Reference Range & Units 07/20/21 14:40  Glucose 70 - 99 mg/dL 86  PTH, Intact 16 - 77 pg/mL 20  TSH 0.35 - 5.50 uIU/mL 1.52  T4,Free(Direct) 0.60 - 1.60 ng/dL 0.71   ASSESSMENT/PLAN/RECOMMENDATIONS:    Osteoporosis:   -She was started on Prolia in 09/2019, patient had vertebral compression fracture and 02/2021 -The vertebral fracture was sustained due to forceful trauma as she was coming down from a barstool -I have discussed with the patient that we will need to repeat her DEXA scan and determine whether to continue Prolia at this time or to change it. -In the meantime she was encouraged to continue with calcium and  vitamin D as well as weightbearing exercises   Medications : Calcium 1200 mg daily Vitamin D 5000 IU daily Prolia 60 mg SQ every 6 months    2. Hyperkalemia :   Pt is not on ARB or ACEi, will encourage hydration and recheck in 1 weeks   Follow-up in 6 months  Will be awaiting for DEXA scan Signed electronically by: Mack Guise, MD  [facility] Endocrinology  [facility] [address] [place], [place] [address] Phone: [phone] FAX: [phone]   CC: Hali Marry, [place] [address] Phone: [phone] Fax: [phone]   Return to Endocrinology clinic as below: Future Appointments  Date Time Provider [place]  10/05/2021  1:30 PM [facility] NURSE [facility] None  01/05/2022 11:30 AM [facility] PHARMACIST [facility] None  01/21/2022  2:00 PM Ayyan Sites, Melanie Crazier, MD [facility] None

## 2021-07-21 ENCOUNTER — Ambulatory Visit (INDEPENDENT_AMBULATORY_CARE_PROVIDER_SITE_OTHER): Payer: Medicare Other | Admitting: Family Medicine

## 2021-07-21 VITALS — BP 114/80 | HR 88 | Temp 98.1°F | Resp 16 | Ht 60.0 in | Wt 123.0 lb

## 2021-07-21 DIAGNOSIS — F411 Generalized anxiety disorder: Secondary | ICD-10-CM

## 2021-07-21 DIAGNOSIS — S22070D Wedge compression fracture of T9-T10 vertebra, subsequent encounter for fracture with routine healing: Secondary | ICD-10-CM

## 2021-07-21 DIAGNOSIS — T50905A Adverse effect of unspecified drugs, medicaments and biological substances, initial encounter: Secondary | ICD-10-CM | POA: Diagnosis not present

## 2021-07-21 DIAGNOSIS — M549 Dorsalgia, unspecified: Secondary | ICD-10-CM

## 2021-07-21 DIAGNOSIS — G8929 Other chronic pain: Secondary | ICD-10-CM

## 2021-07-21 DIAGNOSIS — M545 Low back pain, unspecified: Secondary | ICD-10-CM

## 2021-07-21 LAB — PARATHYROID HORMONE, INTACT (NO CA): PTH: 20 pg/mL (ref 16–77)

## 2021-07-21 MED ORDER — SERTRALINE HCL 100 MG PO TABS
100.0000 mg | ORAL_TABLET | Freq: Every day | ORAL | 1 refills | Status: DC
Start: 2021-07-21 — End: 2022-01-19

## 2021-07-21 NOTE — Assessment & Plan Note (Signed)
Okay to go back up to 100 mg on the sertraline.  I will make sure that she has an up-to-date prescription.

## 2021-07-21 NOTE — Assessment & Plan Note (Addendum)
We will go ahead and place referral to neurosurgery.  She has a preference for provider that her son had been treated by.  She has not had an MRI performed so we will go ahead and get that ordered today of the thoracic spine where she has a known compression fracture and of her lumbar spine where she is also been having pain for several months. I am concerned that she is actually having an increase in her pain recently.

## 2021-07-21 NOTE — Progress Notes (Addendum)
Established Patient Office Visit  Subjective:  Patient ID: Valerie Fisher, female    DOB: Jan 05, 1951  Age: 70 y.o. MRN: [id number]  CC:  Chief Complaint  Patient presents with   Back Pain    Follow up/No improvement. Lower back. Fell in 02/2021. Discuss referral to surgeon.    Discuss Medication    Discuss increasing Zoloft to 100 mg     HPI Valerie Fisher presents for   F/U  mid - back pain  -still having persistent low back pain she previously experienced a compression fracture of T10.  She is really struggled with the pain in her back and says more recently it actually feels like it is getting a little worse.  It now is radiating around bilaterally.  And going a little bit down into her lumbar spine as well.  She also wants to discuss increasing her Zoloft.  She has been taking a half a tab in hopes that she could start to taper her medication but just feels like her irritability has really gone up especially since her back pain is gotten worse.  And she still feels like her stomach is not in a great place still having decreased appetite and not feeling great after she eats.  She also wanted to ask about her Zonegran.  She was previously taking 4 mg daily but started noticing she was getting some hair loss and short-term memory loss so she reached out to Dr. Inocente Salles who said it was okay to go down to 300 mg and she has been on that dose since the spring.  But she wondered if she should go down even further.  But has not heard back from Dr. Inocente Salles recently.  Past Medical History:  Diagnosis Date   Cancer [facility])    Breast Cancer 2010 right   Fatty liver    GERD (gastroesophageal reflux disease)    History of diverticular abscess of colon 12/12/2017   PONV (postoperative nausea and vomiting)    also slow to wake up   Seizures ([place])    last grand mal seizure was 14 years ago (as of 01/05/21)    Past Surgical History:  Procedure Laterality Date   bladder lift     COLOSTOMY     COLOSTOMY  REVERSAL  02/2019   INCISIONAL HERNIA REPAIR N/A 01/07/2021   Procedure: LAPAROSCOPIC INCISIONAL HERNIA WITH MESH;  Surgeon: [name], MD;  Location: [place];  Service: General;  Laterality: N/A;    Family History  Problem Relation Age of Onset   Diabetes Father    Heart attack Father        mid 23s   Colon cancer Neg Hx    Pancreatic cancer Neg Hx    Stomach cancer Neg Hx    Esophageal cancer Neg Hx    Liver cancer Neg Hx     Social History   Socioeconomic History   Marital status: Married    Spouse name: BJ   Number of children: 3   Years of education: 20   Highest education level: Master's degree (e.g., MA, MS, MEng, MEd, MSW, MBA)  Occupational History   Occupation: retired Pharmacist, hospital  Tobacco Use   Smoking status: Never   Smokeless tobacco: Never  Vaping Use   Vaping Use: Never used  Substance and Sexual Activity   Alcohol use: Not Currently    Alcohol/week: 0.0 standard drinks   Drug use: Never   Sexual activity: Yes    Partners: Male  Birth control/protection: None  Other Topics Concern   Not on file  Social History Narrative   Lives with her husband. They have three children. They have one dog. In her free time, she enjoys cooking and exercising.    Social Determinants of Health   Financial Resource Strain: Low Risk    Difficulty of Paying Living Expenses: Not hard at all  Food Insecurity: No Food Insecurity   Worried About Charity fundraiser in the Last Year: Never true   [place] in the Last Year: Never true  Transportation Needs: No Transportation Needs   Lack of Transportation (Medical): No   Lack of Transportation (Non-Medical): No  Physical Activity: Sufficiently Active   Days of Exercise per Week: 5 days   Minutes of Exercise per Session: 30 min  Stress: No Stress Concern Present   Feeling of Stress : Not at all  Social Connections: Moderately Isolated   Frequency of Communication with Friends and Family: More than three times a  week   Frequency of Social Gatherings with Friends and Family: Once a week   Attends Religious Services: Never   Marine scientist or Organizations: No   Attends Music therapist: Never   Marital Status: Married  Human resources officer Violence: Not At Risk   Fear of Current or Ex-Partner: No   Emotionally Abused: No   Physically Abused: No   Sexually Abused: No    Outpatient Medications Prior to Visit  Medication Sig Dispense Refill   aspirin-acetaminophen-caffeine (EXCEDRIN MIGRAINE) 250-250-65 MG tablet Take 1 tablet by mouth every 6 (six) hours as needed for headache.     Calcium Carbonate-Vit D-Min (CALCIUM 1200 PO) Take 1 tablet by mouth daily.     Cholecalciferol (VITAMIN D) 125 MCG (5000 UT) CAPS Take 5,000 Units by mouth daily.     denosumab (PROLIA) 60 MG/ML SOSY injection Inject 60 mg into the skin every 6 (six) months.     fluticasone (FLONASE) 50 MCG/ACT nasal spray Place 1 spray into both nostrils daily as needed for allergies or rhinitis.     levETIRAcetam (KEPPRA) 500 MG tablet Take 500-1,000 mg by mouth See admin instructions. Take 500 mg in the morning and 1000 mg in the evening  3   Melatonin 5 MG CAPS Take 5 mg by mouth at bedtime as needed (sleep).     SUMAtriptan (IMITREX) 20 MG/ACT nasal spray Place 1 spray (20 mg total) into the nose every 2 (two) hours as needed for migraine. May repeat in 2 hours if headache persists or recurs. 1 each 2   zonisamide (ZONEGRAN) 100 MG capsule Take 300 mg by mouth at bedtime.  1   sertraline (ZOLOFT) 100 MG tablet Take 1 tablet (100 mg total) by mouth daily. (Patient taking differently: Take 50 mg by mouth daily.) 30 tablet 0   Multiple Vitamin (MULTIVITAMIN ADULT PO) Take 2 each by mouth daily.     OVER THE COUNTER MEDICATION Take 2 capsules by mouth 3 (three) times daily before meals.     Facility-Administered Medications Prior to Visit  Medication Dose Route Frequency Provider Last Rate Last Admin   0.9 %  sodium  chloride infusion  500 mL Intravenous Continuous Cirigliano, Vito V, DO        Allergies  Allergen Reactions   Aspirin Other (See Comments)    GI upset    Hydrocodone-Acetaminophen Nausea And Vomiting   Hydromorphone Hcl Nausea And Vomiting   Morphine Nausea And Vomiting  Pseudoephedrine Hcl Nausea And Vomiting   Other Nausea Only    General anesthesia    Zofran [Ondansetron] Other (See Comments)    Headache    ROS Review of Systems    Objective:    Physical Exam Constitutional:      Appearance: Normal appearance. She is well-developed.  HENT:     Head: Normocephalic and atraumatic.  Cardiovascular:     Rate and Rhythm: Normal rate and regular rhythm.     Heart sounds: Normal heart sounds.  Pulmonary:     Effort: Pulmonary effort is normal.     Breath sounds: Normal breath sounds.  Skin:    General: Skin is warm and dry.  Neurological:     Mental Status: She is alert and oriented to person, place, and time.  Psychiatric:        Behavior: Behavior normal.    BP 114/80   Pulse 88   Temp 98.1 F (36.7 C)   Resp 16   Ht 5' (1.524 m)   Wt 123 lb (55.8 kg)   SpO2 98%   BMI 24.02 kg/m  Wt Readings from Last 3 Encounters:  07/21/21 123 lb (55.8 kg)  07/20/21 124 lb (56.2 kg)  05/12/21 122 lb 4 oz (55.5 kg)     There are no preventive care reminders to display for this patient.  There are no preventive care reminders to display for this patient.  Lab Results  Component Value Date   TSH 1.52 07/20/2021   Lab Results  Component Value Date   WBC 7.7 01/08/2021   HGB 12.4 01/08/2021   HCT 37.9 01/08/2021   MCV 89.6 01/08/2021   PLT 260 01/08/2021   Lab Results  Component Value Date   NA 139 07/20/2021   K 5.4 No hemolysis seen (H) 07/20/2021   CO2 24 07/20/2021   GLUCOSE 86 07/20/2021   BUN 28 (H) 07/20/2021   CREATININE 0.71 07/20/2021   BILITOT 0.4 03/27/2021   ALKPHOS 86 03/27/2021   AST 15 03/27/2021   ALT 9 03/27/2021   PROT 7.1  03/27/2021   ALBUMIN 4.9 (H) 03/27/2021   CALCIUM 9.8 07/20/2021   ANIONGAP 7 01/08/2021   EGFR 85 03/27/2021   GFR 84.77 07/20/2021   Lab Results  Component Value Date   CHOL 246 (H) 09/12/2019   Lab Results  Component Value Date   HDL 88 09/12/2019   Lab Results  Component Value Date   LDLCALC 134 (H) 09/12/2019   Lab Results  Component Value Date   TRIG 138 09/12/2019   Lab Results  Component Value Date   CHOLHDL 2.8 09/12/2019   No results found for: HGBA1C    Assessment & Plan:   Problem List Items Addressed This Visit       Musculoskeletal and Integument   Compression fracture of thoracic vertebra with routine healing - Primary    We will go ahead and place referral to neurosurgery.  She has a preference for provider that her son had been treated by.  She has not had an MRI performed so we will go ahead and get that ordered today of the thoracic spine where she has a known compression fracture and of her lumbar spine where she is also been having pain for several months. I am concerned that she is actually having an increase in her pain recently.      Relevant Orders   MR Thoracic Spine Wo Contrast   MR Lumbar Spine Wo Contrast  Ambulatory referral to Neurosurgery     Other   GAD (generalized anxiety disorder)    Okay to go back up to 100 mg on the sertraline.  I will make sure that she has an up-to-date prescription.      Relevant Medications   sertraline (ZOLOFT) 100 MG tablet   Other Visit Diagnoses     Mid back pain       Relevant Orders   MR Thoracic Spine Wo Contrast   MR Lumbar Spine Wo Contrast   Ambulatory referral to Neurosurgery   Chronic midline low back pain without sciatica       Relevant Medications   sertraline (ZOLOFT) 100 MG tablet   Other Relevant Orders   MR Thoracic Spine Wo Contrast   MR Lumbar Spine Wo Contrast   Ambulatory referral to Neurosurgery   Medication side effect, initial encounter          Medication side  effect-she did get some hair loss on the higher dose of Zonegran.  And does feel like it is affecting her memory.  She is can to try to follow back up with Dr. Inocente Salles on that.  Meds ordered this encounter  Medications   sertraline (ZOLOFT) 100 MG tablet    Sig: Take 1 tablet (100 mg total) by mouth daily.    Dispense:  90 tablet    Refill:  1     Follow-up: Return in about 6 months (around 01/19/2022) for Anxiety .    Beatrice Lecher, MD

## 2021-07-21 NOTE — Addendum Note (Signed)
Addended by: Beatrice Lecher D on: 07/21/2021 06:19 PM   Modules accepted: Orders

## 2021-07-25 ENCOUNTER — Other Ambulatory Visit: Payer: Medicare Other

## 2021-08-01 ENCOUNTER — Other Ambulatory Visit: Payer: Self-pay

## 2021-08-01 ENCOUNTER — Ambulatory Visit (INDEPENDENT_AMBULATORY_CARE_PROVIDER_SITE_OTHER): Payer: Medicare Other

## 2021-08-01 DIAGNOSIS — M40204 Unspecified kyphosis, thoracic region: Secondary | ICD-10-CM | POA: Diagnosis not present

## 2021-08-01 DIAGNOSIS — R29890 Loss of height: Secondary | ICD-10-CM | POA: Diagnosis not present

## 2021-08-01 DIAGNOSIS — M545 Low back pain, unspecified: Secondary | ICD-10-CM | POA: Diagnosis not present

## 2021-08-01 DIAGNOSIS — S22070D Wedge compression fracture of T9-T10 vertebra, subsequent encounter for fracture with routine healing: Secondary | ICD-10-CM | POA: Diagnosis not present

## 2021-08-01 DIAGNOSIS — M549 Dorsalgia, unspecified: Secondary | ICD-10-CM

## 2021-08-01 DIAGNOSIS — G8929 Other chronic pain: Secondary | ICD-10-CM

## 2021-08-01 IMAGING — MR MR LUMBAR SPINE W/O CM
4 of 5 series · 24 of 48 positions shown · non-contrast
Comparison: None.

CLINICAL DATA: Fall [DATE] with T10 compression fracture. Mid
and low back pain. History of breast cancer in [[place]].

EXAM:
MRI THORACIC AND LUMBAR SPINE WITHOUT CONTRAST
TECHNIQUE: Multiplanar and multiecho pulse sequences of the thoracic and lumbar
spine were obtained without intravenous contrast.

[Series 2: T2 · sagittal · 4.0mm · 0.81mm/px · 7 of 15 slices shown (1 of 2)]
[im 1/15]
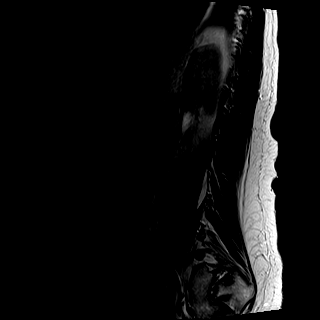
[im 3/15]
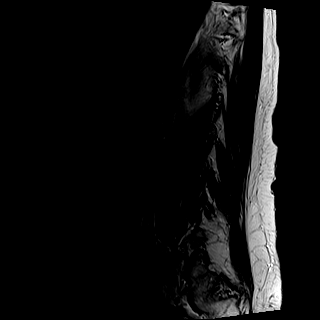
[im 5/15]
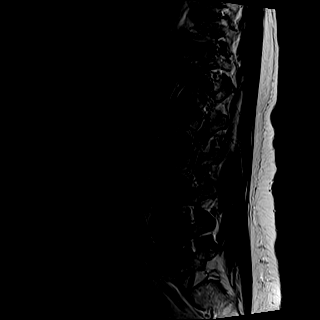
[im 8/15]
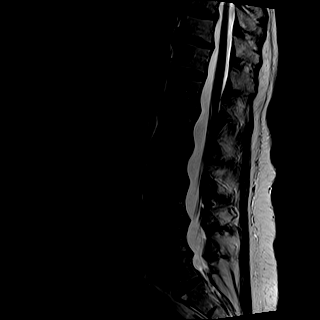
[im 10/15]
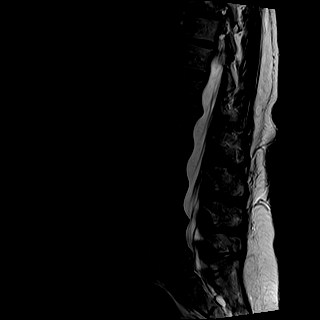
[im 12/15]
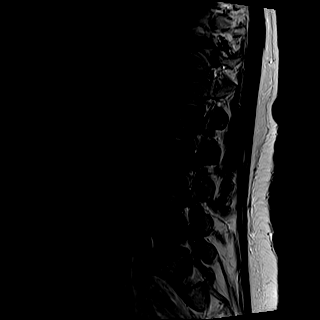
[im 15/15]
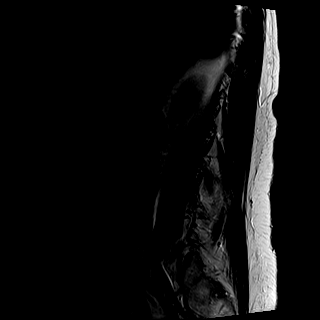

[Series 3: T1 · sagittal · 4.0mm · 0.41mm/px · 6 of 15 slices shown (1 of 2)]
[im 1/15]
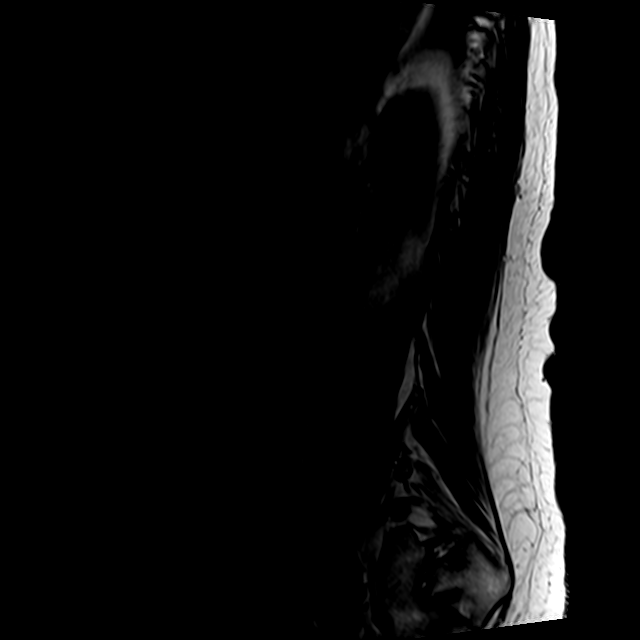
[im 3/15]
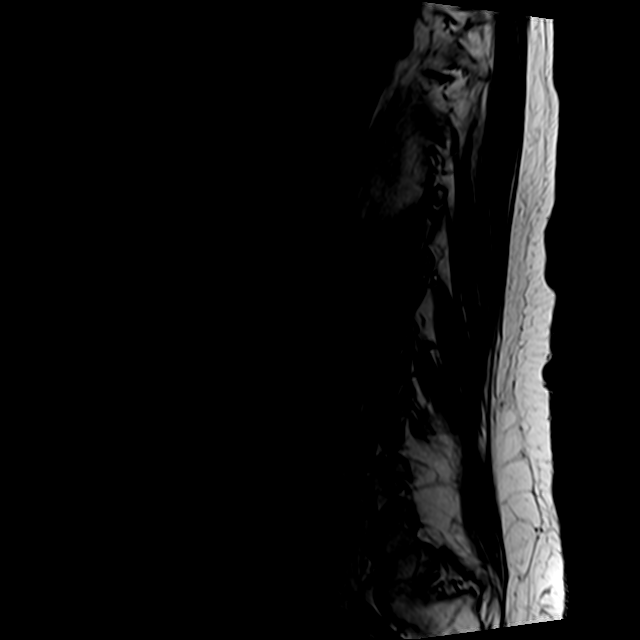
[im 5/15]
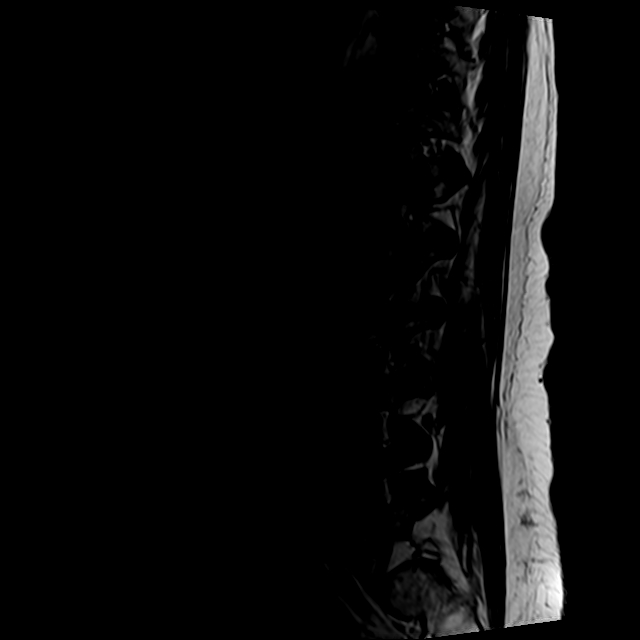
[im 8/15]
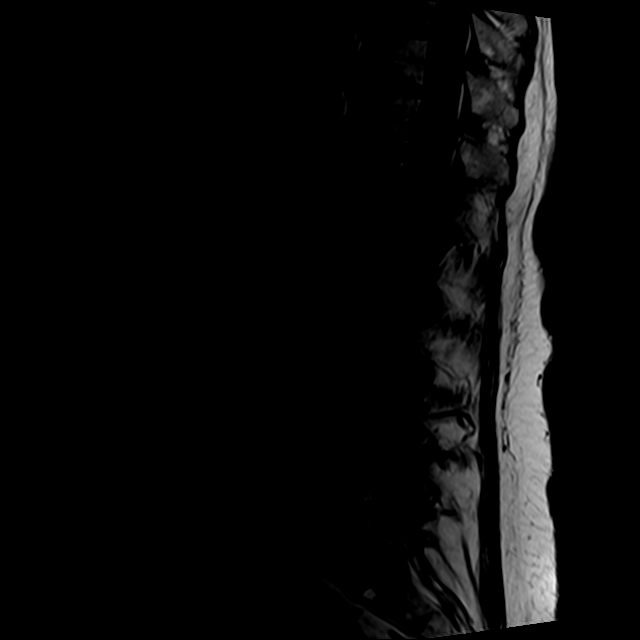
[im 10/15]
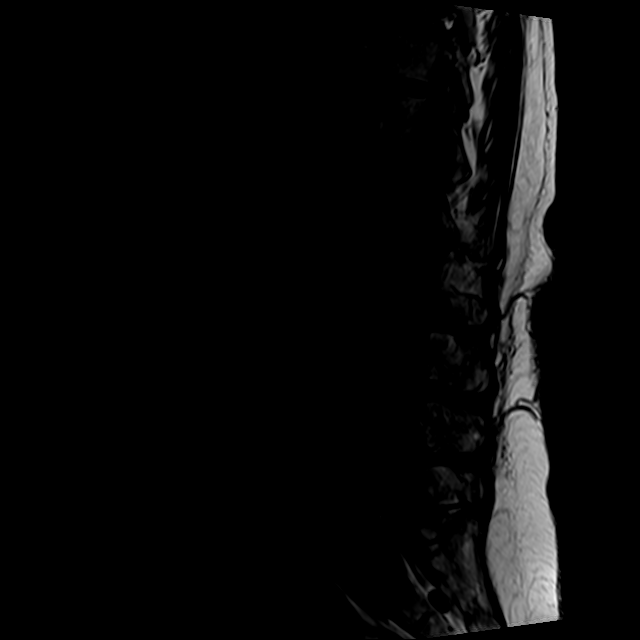
[im 12/15]
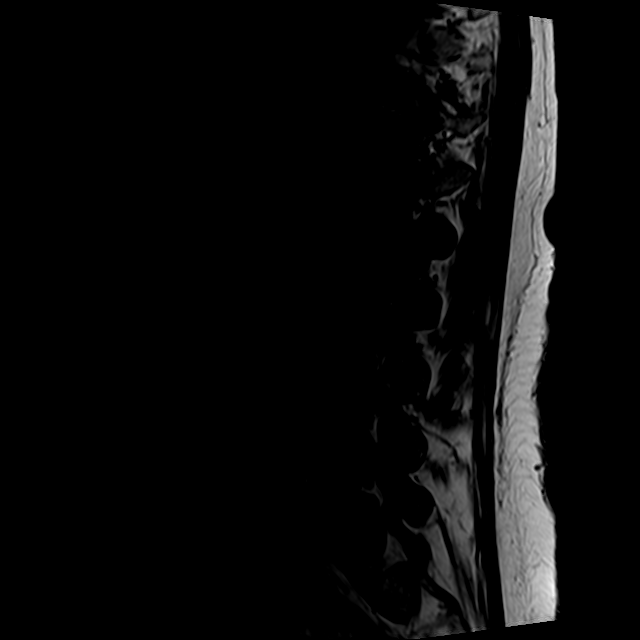

[Series 5: T2 · axial · 4.0mm · 0.78mm/px · z∈[-387,-215]mm · 8 of 33 slices shown (2 of 2)]
[im 1/33]
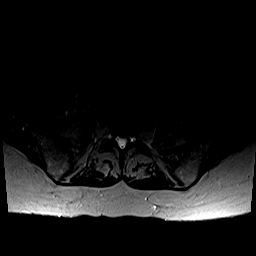
[im 5/33]
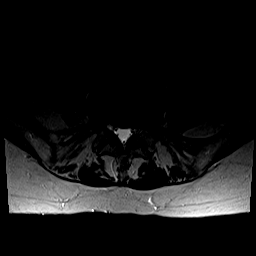
[im 10/33]
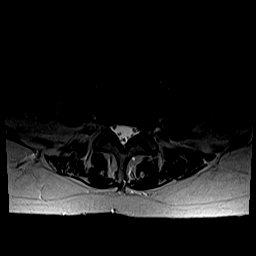
[im 15/33]
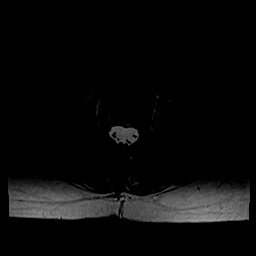
[im 18/33]
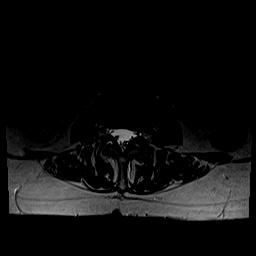
[im 23/33]
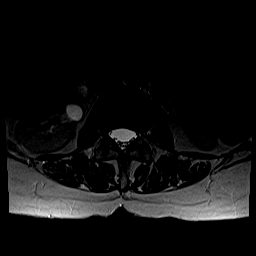
[im 28/33]
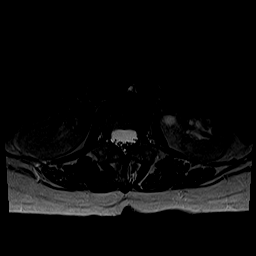
[im 33/33]
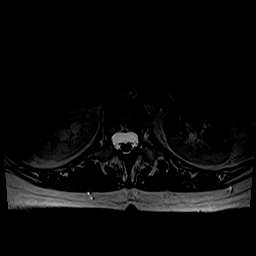

[Series 6: T1 · axial · 4.0mm · 0.39mm/px · z∈[-367,-240]mm · 3 of 33 slices shown (2 of 2)]
[im 5/33]
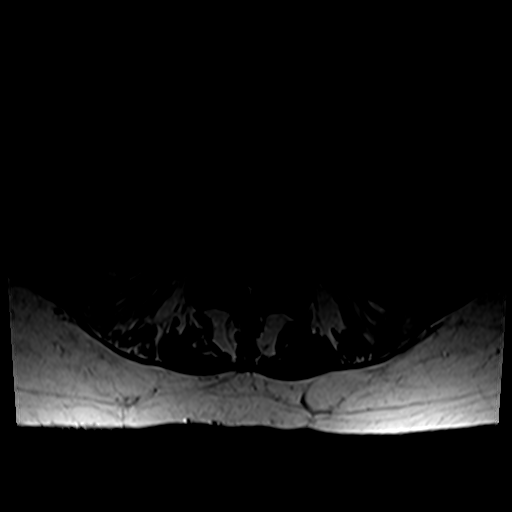
[im 18/33]
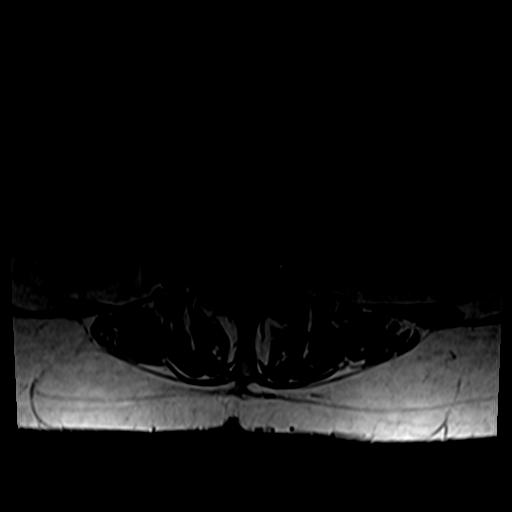
[im 28/33]
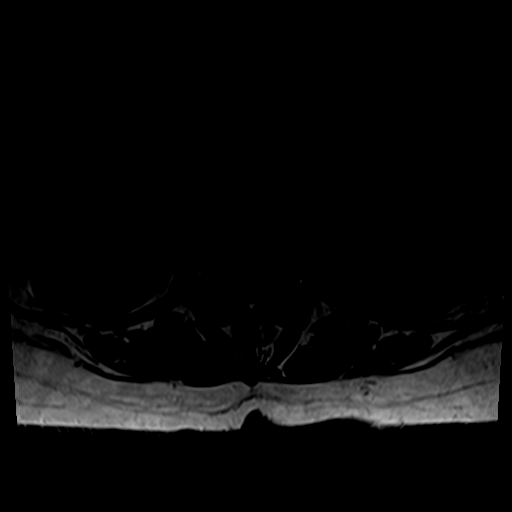

[24 of 48 positions shown; findings below may reference images not displayed]

FINDINGS: MRI THORACIC SPINE FINDINGS

Alignment:  Exaggerated thoracic kyphosis.

Vertebrae: Remote superior endplate fractures of T4-T6, healed.
Nonacute T10 fracture with trace remaining marrow edema. Height loss
at T10 measures up to 50%. No evidence of aggressive bone lesion. No
retropulsion.

Cord:  Normal signal and morphology.

Paraspinal and other soft tissues: No evidence of perispinal mass or
inflammation.

Disc levels:

Ordinary disc desiccation and narrowing with endplate spurring.
Notable disc herniation at T7-8 with upward and downward migration
and possible ossification. The herniation contacts the ventral cord
with indentation but no cord compression. Facet spurring mainly at
T11-12. Diffusely patent foramina.

MRI LUMBAR SPINE FINDINGS

Segmentation:  5 lumbar type vertebrae

Alignment:  Physiologic.

Vertebrae: No acute fracture, evidence of discitis, or bone lesion.
Remote, healed fractures of the L2 and L4 endplates.

Conus medullaris and cauda equina: Conus extends to the L2 level.
Conus and cauda equina appear normal.

Paraspinal and other soft tissues: Negative for perispinal mass or
inflammation

Disc levels:

T12- L1: Unremarkable.

L1-L2: Mild disc bulging with small right paracentral protrusion

L2-L3: Minor disc bulging

L3-L4: Mild disc bulging. Left foraminal annular fissure and shallow
protrusion contacting but not compressing the L3 nerve root

L4-L5: Disc narrowing and left eccentric bulging with left
paracentral to foraminal herniation contacting but not compressing
the left L4 and L5 nerve roots.

L5-S1:Greatest level of degenerative disc narrowing with disc
bulging and mild facet spurring.
IMPRESSION: MR THORACIC SPINE IMPRESSION

1. Remote T10 compression fracture with 50% height loss. Essentially
completed healing with only trace remaining T2 signal abnormality.
2. Remote compression fractures of T5, T6, and T7.
3. T7-8 large chronic disc herniation upward and downward migration
but still patulous canal.

MR LUMBAR SPINE IMPRESSION

1. Remote endplate fractures at L2 and L4.
2. L4-5 small left paracentral to foraminal herniation contacting
but not compressing the left L5 and L4 nerve roots.

## 2021-08-01 IMAGING — MR MR THORACIC SPINE W/O CM
5 series · 37 of 48 positions shown · non-contrast
Comparison: None.

CLINICAL DATA: Fall [DATE] with T10 compression fracture. Mid
and low back pain. History of breast cancer in [[place]].

EXAM:
MRI THORACIC AND LUMBAR SPINE WITHOUT CONTRAST
TECHNIQUE: Multiplanar and multiecho pulse sequences of the thoracic and lumbar
spine were obtained without intravenous contrast.

[Series 4: T2 · sagittal · 3.0mm · 1.00mm/px · 6 of 17 slices shown (1 of 2)]
[im 1/17]
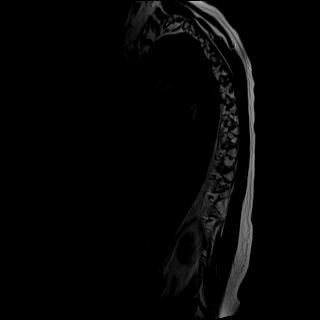
[im 4/17]
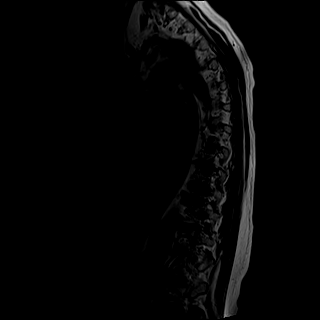
[im 7/17]
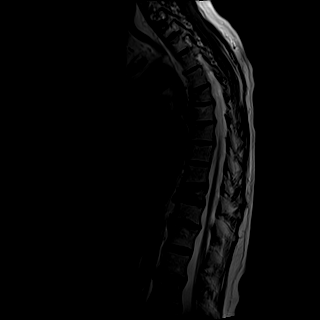
[im 10/17]
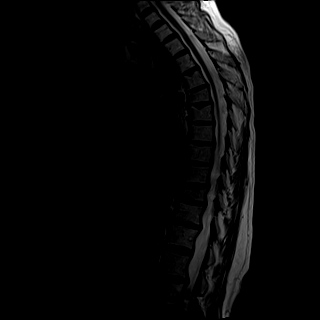
[im 13/17]
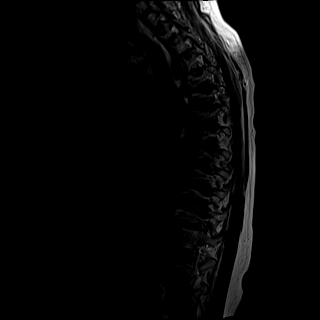
[im 17/17]
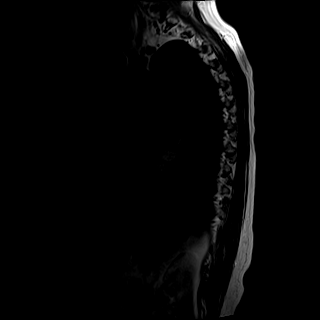

[Series 5: T1 · sagittal · 3.0mm · 1.33mm/px · 6 of 17 slices shown]
[im 1/17]
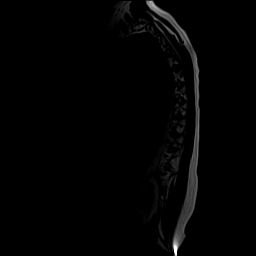
[im 4/17]
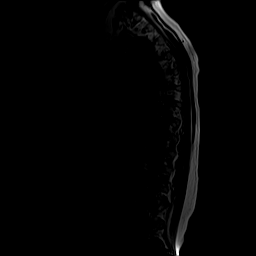
[im 7/17]
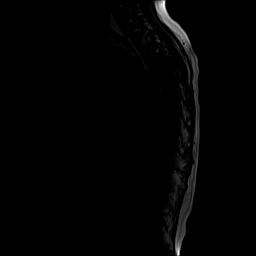
[im 10/17]
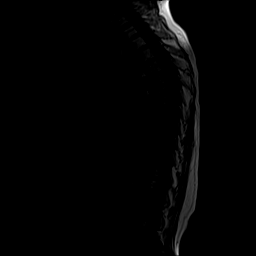
[im 13/17]
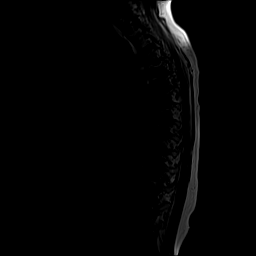
[im 17/17]
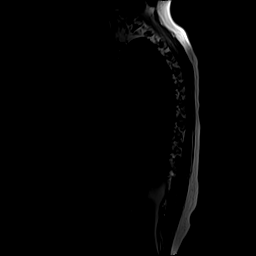

[Series 6: STIR · sagittal · 3.0mm · 1.06mm/px · 6 of 17 slices shown]
[im 1/17]
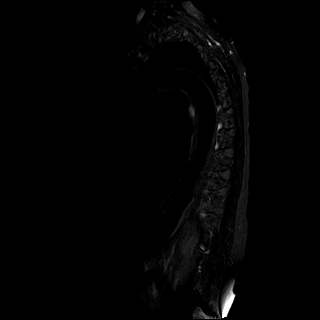
[im 4/17]
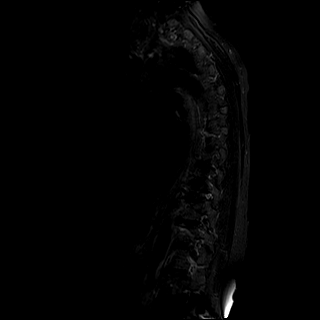
[im 7/17]
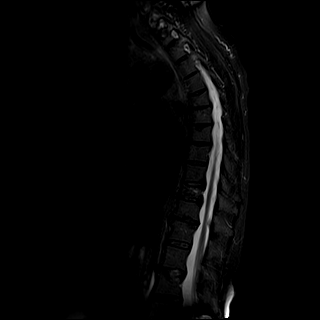
[im 10/17]
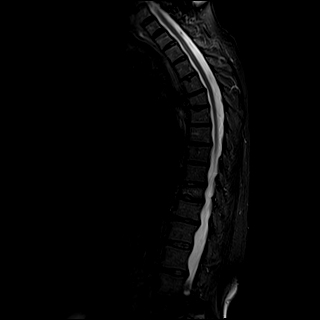
[im 13/17]
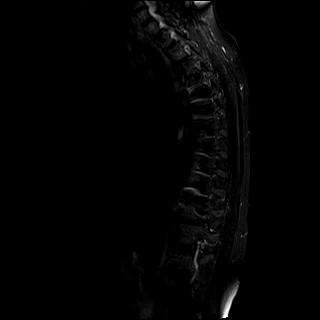
[im 17/17]
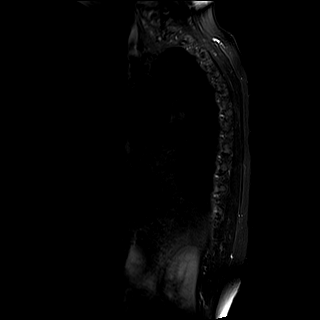

[Series 7: T2 · axial · 5.0mm · 0.78mm/px · z∈[-194,-32]mm · 11 of 39 slices shown (2 of 2)]
[im 1/39]
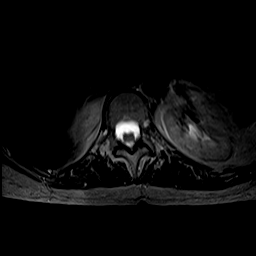
[im 3/39]
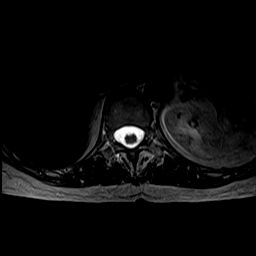
[im 6/39]
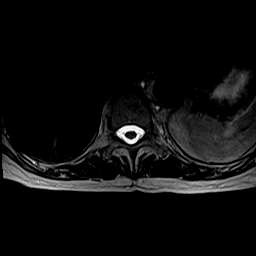
[im 9/39]
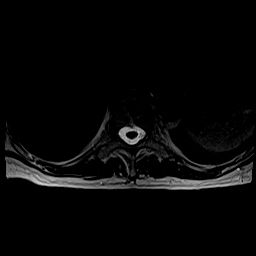
[im 11/39]
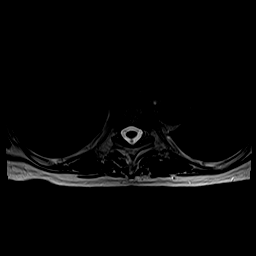
[im 17/39]
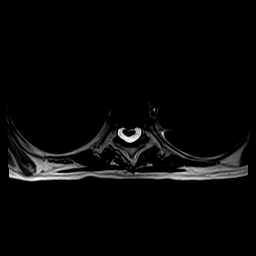
[im 20/39]
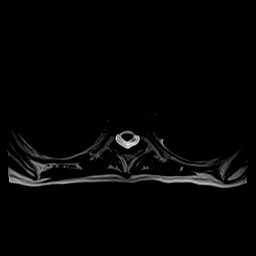
[im 22/39]
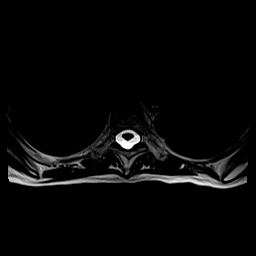
[im 28/39]
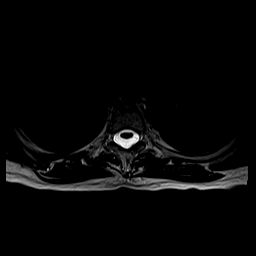
[im 33/39]
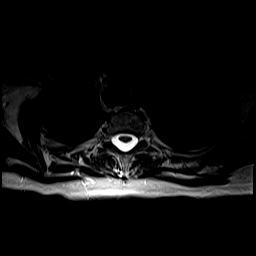
[im 39/39]
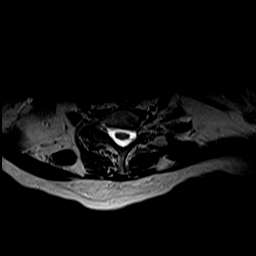

[Series 8: mpgr ax · axial · 5.0mm · 0.39mm/px · z∈[-194,-32]mm · 8 of 39 slices shown]
[im 1/39]
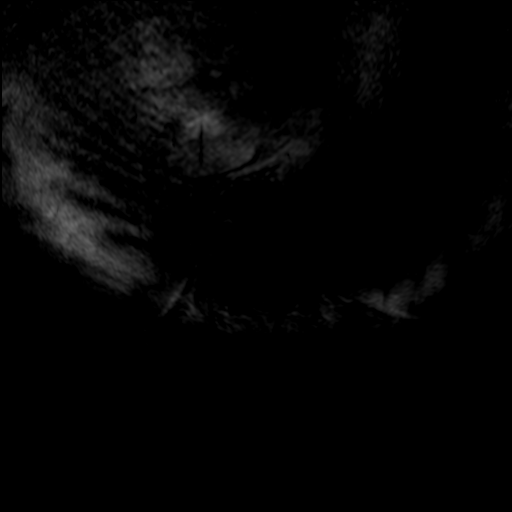
[im 6/39]
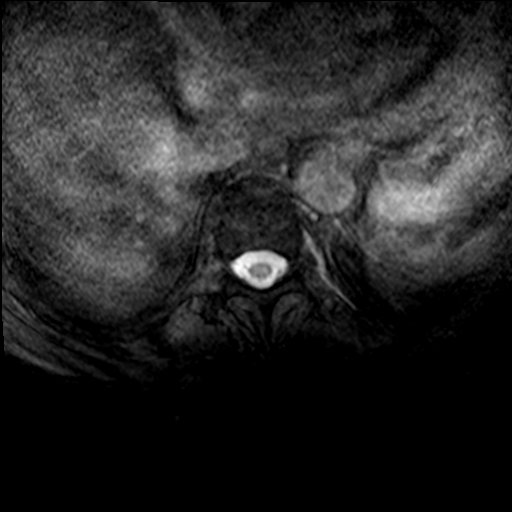
[im 11/39]
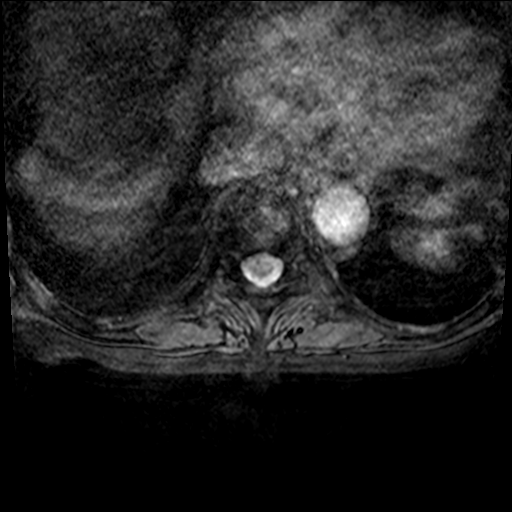
[im 17/39]
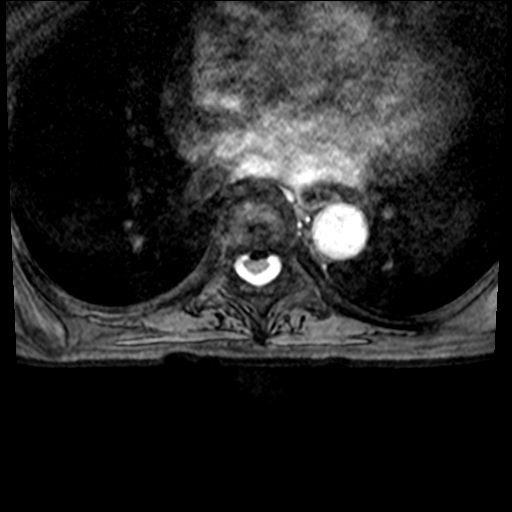
[im 22/39]
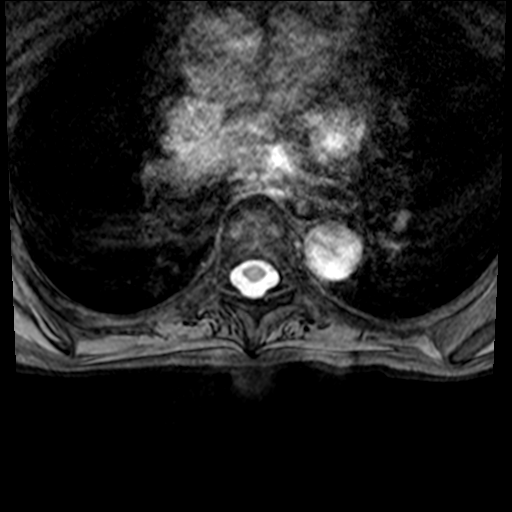
[im 28/39]
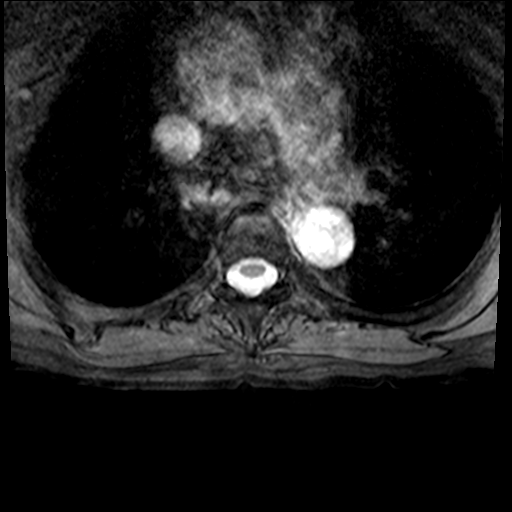
[im 33/39]
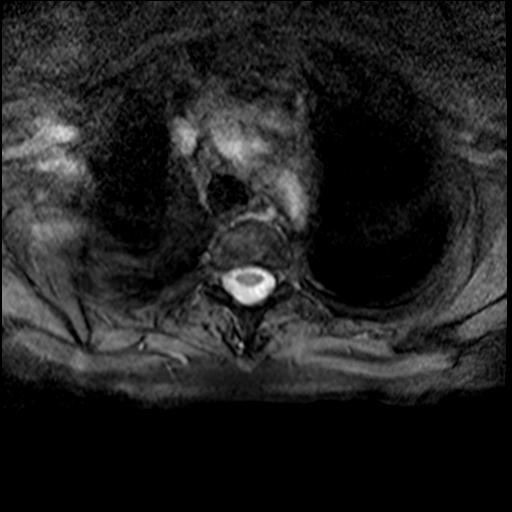
[im 39/39]
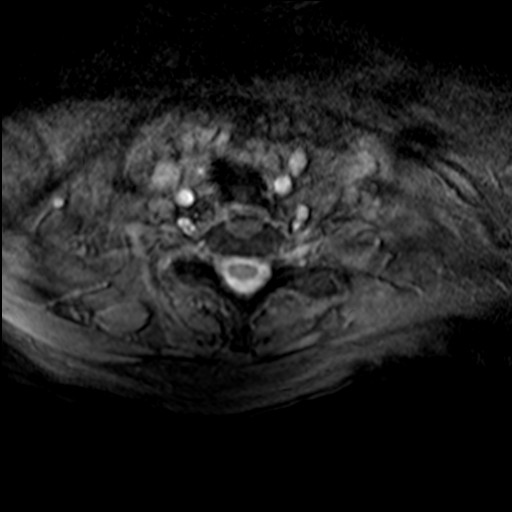

[37 of 48 positions shown; findings below may reference images not displayed]

FINDINGS: MRI THORACIC SPINE FINDINGS

Alignment:  Exaggerated thoracic kyphosis.

Vertebrae: Remote superior endplate fractures of T4-T6, healed.
Nonacute T10 fracture with trace remaining marrow edema. Height loss
at T10 measures up to 50%. No evidence of aggressive bone lesion. No
retropulsion.

Cord:  Normal signal and morphology.

Paraspinal and other soft tissues: No evidence of perispinal mass or
inflammation.

Disc levels:

Ordinary disc desiccation and narrowing with endplate spurring.
Notable disc herniation at T7-8 with upward and downward migration
and possible ossification. The herniation contacts the ventral cord
with indentation but no cord compression. Facet spurring mainly at
T11-12. Diffusely patent foramina.

MRI LUMBAR SPINE FINDINGS

Segmentation:  5 lumbar type vertebrae

Alignment:  Physiologic.

Vertebrae: No acute fracture, evidence of discitis, or bone lesion.
Remote, healed fractures of the L2 and L4 endplates.

Conus medullaris and cauda equina: Conus extends to the L2 level.
Conus and cauda equina appear normal.

Paraspinal and other soft tissues: Negative for perispinal mass or
inflammation

Disc levels:

T12- L1: Unremarkable.

L1-L2: Mild disc bulging with small right paracentral protrusion

L2-L3: Minor disc bulging

L3-L4: Mild disc bulging. Left foraminal annular fissure and shallow
protrusion contacting but not compressing the L3 nerve root

L4-L5: Disc narrowing and left eccentric bulging with left
paracentral to foraminal herniation contacting but not compressing
the left L4 and L5 nerve roots.

L5-S1:Greatest level of degenerative disc narrowing with disc
bulging and mild facet spurring.
IMPRESSION: MR THORACIC SPINE IMPRESSION

1. Remote T10 compression fracture with 50% height loss. Essentially
completed healing with only trace remaining T2 signal abnormality.
2. Remote compression fractures of T5, T6, and T7.
3. T7-8 large chronic disc herniation upward and downward migration
but still patulous canal.

MR LUMBAR SPINE IMPRESSION

1. Remote endplate fractures at L2 and L4.
2. L4-5 small left paracentral to foraminal herniation contacting
but not compressing the left L5 and L4 nerve roots.

## 2021-08-03 DIAGNOSIS — S22078A Other fracture of T9-T10 vertebra, initial encounter for closed fracture: Secondary | ICD-10-CM | POA: Diagnosis not present

## 2021-08-03 NOTE — Progress Notes (Signed)
Hi Javae, you did we do see the old T10 compression fracture.  It actually decrease the height of the vertebrae by about 50% which is pretty significant.  It does look like it is completed healing which is reassuring.  Looks like he had some old remote compression fractures at T5, T6 and T7.  And you do have a herniated disc at T7-8.  In the lumbar spine you have an old endplate fracture at L2 and L4.  And a disc issue at L4-5.  Good news is it does not look like it is compressing the nerve roots which is reassuring.  Has neurosurgery contacted you yet?  We can definitely send a copy of these reports over to them.

## 2021-08-05 ENCOUNTER — Telehealth: Payer: Self-pay

## 2021-08-05 DIAGNOSIS — G40A09 Absence epileptic syndrome, not intractable, without status epilepticus: Secondary | ICD-10-CM

## 2021-08-05 NOTE — Telephone Encounter (Signed)
Neurology referral placed

## 2021-08-05 NOTE — Telephone Encounter (Signed)
Patient called stating she is currently seeing [facility] Neurology. Patient requesting for a referral to see a Neurologist in [place] since it is closer for her to drive.

## 2021-08-24 DIAGNOSIS — M85852 Other specified disorders of bone density and structure, left thigh: Secondary | ICD-10-CM | POA: Diagnosis not present

## 2021-08-24 DIAGNOSIS — M8588 Other specified disorders of bone density and structure, other site: Secondary | ICD-10-CM | POA: Diagnosis not present

## 2021-08-24 DIAGNOSIS — M810 Age-related osteoporosis without current pathological fracture: Secondary | ICD-10-CM | POA: Diagnosis not present

## 2021-08-26 ENCOUNTER — Encounter: Payer: Self-pay | Admitting: Internal Medicine

## 2021-10-05 ENCOUNTER — Ambulatory Visit: Payer: Medicare Other

## 2021-10-05 ENCOUNTER — Telehealth: Payer: Self-pay

## 2021-10-05 ENCOUNTER — Other Ambulatory Visit: Payer: Self-pay

## 2021-10-05 DIAGNOSIS — M810 Age-related osteoporosis without current pathological fracture: Secondary | ICD-10-CM

## 2021-10-05 NOTE — Telephone Encounter (Signed)
Valerie Fisher was scheduled for tomorrow for a Prolia injection. There are no labs in the system and there is not PA information.   I called and rescheduled patient for next week. Ordered labs and patient will go tomorrow.  I called [facility], there is an approval for Prolia 09/16/2021 - 08/15/2022.

## 2021-10-06 ENCOUNTER — Ambulatory Visit: Payer: Medicare Other

## 2021-10-08 LAB — CMP14+EGFR
ALT: 13 IU/L (ref 0–32)
AST: 17 IU/L (ref 0–40)
Albumin/Globulin Ratio: 2.5 — ABNORMAL HIGH (ref 1.2–2.2)
Albumin: 4.7 g/dL (ref 3.7–4.7)
Alkaline Phosphatase: 55 IU/L (ref 44–121)
BUN/Creatinine Ratio: 25 (ref 12–28)
BUN: 18 mg/dL (ref 8–27)
Bilirubin Total: 0.2 mg/dL (ref 0.0–1.2)
CO2: 20 mmol/L (ref 20–29)
Calcium: 9.2 mg/dL (ref 8.7–10.3)
Chloride: 106 mmol/L (ref 96–106)
Creatinine, Ser: 0.71 mg/dL (ref 0.57–1.00)
Globulin, Total: 1.9 g/dL (ref 1.5–4.5)
Glucose: 77 mg/dL (ref 70–99)
Potassium: 4.2 mmol/L (ref 3.5–5.2)
Sodium: 141 mmol/L (ref 134–144)
Total Protein: 6.6 g/dL (ref 6.0–8.5)
eGFR: 90 mL/min/{1.73_m2} (ref >59)

## 2021-10-08 NOTE — Progress Notes (Signed)
Your lab work is within acceptable range and there are no concerning findings.   ?

## 2021-10-13 ENCOUNTER — Ambulatory Visit (INDEPENDENT_AMBULATORY_CARE_PROVIDER_SITE_OTHER): Payer: Medicare Other | Admitting: Family Medicine

## 2021-10-13 ENCOUNTER — Other Ambulatory Visit: Payer: Self-pay

## 2021-10-13 VITALS — BP 115/75 | HR 79

## 2021-10-13 DIAGNOSIS — M810 Age-related osteoporosis without current pathological fracture: Secondary | ICD-10-CM | POA: Diagnosis not present

## 2021-10-13 MED ORDER — DENOSUMAB 60 MG/ML ~~LOC~~ SOSY
60.0000 mg | PREFILLED_SYRINGE | Freq: Once | SUBCUTANEOUS | Status: AC
  Administered 2021-10-13: 60 mg via SUBCUTANEOUS

## 2021-10-13 NOTE — Progress Notes (Signed)
Established Patient Office Visit  Subjective:  Patient ID: Valerie Fisher, female    DOB: March 11, 1951  Age: 71 y.o. MRN: [id number]  CC:  Chief Complaint  Patient presents with   Osteoporosis    HPI [name] presents for Prolia injection.   Past Medical History:  Diagnosis Date   Cancer [facility])    Breast Cancer 2010 right   Fatty liver    GERD (gastroesophageal reflux disease)    History of diverticular abscess of colon 12/12/2017   PONV (postoperative nausea and vomiting)    also slow to wake up   Seizures ([place])    last grand mal seizure was 14 years ago (as of 01/05/21)    Past Surgical History:  Procedure Laterality Date   bladder lift     COLOSTOMY     COLOSTOMY REVERSAL  02/2019   INCISIONAL HERNIA REPAIR N/A 01/07/2021   Procedure: LAPAROSCOPIC INCISIONAL HERNIA WITH MESH;  Surgeon: [name], MD;  Location: [place];  Service: General;  Laterality: N/A;    Family History  Problem Relation Age of Onset   Diabetes Father    Heart attack Father        mid 59s   Colon cancer Neg Hx    Pancreatic cancer Neg Hx    Stomach cancer Neg Hx    Esophageal cancer Neg Hx    Liver cancer Neg Hx     Social History   Socioeconomic History   Marital status: Married    Spouse name: BJ   Number of children: 3   Years of education: 20   Highest education level: Master's degree (e.g., MA, MS, MEng, MEd, MSW, MBA)  Occupational History   Occupation: retired Pharmacist, hospital  Tobacco Use   Smoking status: Never   Smokeless tobacco: Never  Vaping Use   Vaping Use: Never used  Substance and Sexual Activity   Alcohol use: Not Currently    Alcohol/week: 0.0 standard drinks   Drug use: Never   Sexual activity: Yes    Partners: Male    Birth control/protection: None  Other Topics Concern   Not on file  Social History Narrative   Lives with her husband. They have three children. They have one dog. In her free time, she enjoys cooking and exercising.    Social  Determinants of Health   Financial Resource Strain: Low Risk    Difficulty of Paying Living Expenses: Not hard at all  Food Insecurity: No Food Insecurity   Worried About Charity fundraiser in the Last Year: Never true   [place] in the Last Year: Never true  Transportation Needs: No Transportation Needs   Lack of Transportation (Medical): No   Lack of Transportation (Non-Medical): No  Physical Activity: Sufficiently Active   Days of Exercise per Week: 5 days   Minutes of Exercise per Session: 30 min  Stress: No Stress Concern Present   Feeling of Stress : Not at all  Social Connections: Moderately Isolated   Frequency of Communication with Friends and Family: More than three times a week   Frequency of Social Gatherings with Friends and Family: Once a week   Attends Religious Services: Never   Marine scientist or Organizations: No   Attends Music therapist: Never   Marital Status: Married  Human resources officer Violence: Not At Risk   Fear of Current or Ex-Partner: No   Emotionally Abused: No   Physically Abused: No   Sexually Abused: No  Outpatient Medications Prior to Visit  Medication Sig Dispense Refill   aspirin-acetaminophen-caffeine (EXCEDRIN MIGRAINE) 250-250-65 MG tablet Take 1 tablet by mouth every 6 (six) hours as needed for headache.     Calcium Carbonate-Vit D-Min (CALCIUM 1200 PO) Take 1 tablet by mouth daily.     Cholecalciferol (VITAMIN D) 125 MCG (5000 UT) CAPS Take 5,000 Units by mouth daily.     denosumab (PROLIA) 60 MG/ML SOSY injection Inject 60 mg into the skin every 6 (six) months.     fluticasone (FLONASE) 50 MCG/ACT nasal spray Place 1 spray into both nostrils daily as needed for allergies or rhinitis.     levETIRAcetam (KEPPRA) 500 MG tablet Take 500-1,000 mg by mouth See admin instructions. Take 500 mg in the morning and 1000 mg in the evening  3   Melatonin 5 MG CAPS Take 5 mg by mouth at bedtime as needed (sleep).      sertraline (ZOLOFT) 100 MG tablet Take 1 tablet (100 mg total) by mouth daily. 90 tablet 1   SUMAtriptan (IMITREX) 20 MG/ACT nasal spray Place 1 spray (20 mg total) into the nose every 2 (two) hours as needed for migraine. May repeat in 2 hours if headache persists or recurs. 1 each 2   zonisamide (ZONEGRAN) 100 MG capsule Take 300 mg by mouth at bedtime.  1   Facility-Administered Medications Prior to Visit  Medication Dose Route Frequency Provider Last Rate Last Admin   0.9 %  sodium chloride infusion  500 mL Intravenous Continuous Cirigliano, Vito V, DO        Allergies  Allergen Reactions   Aspirin Other (See Comments)    GI upset    Hydrocodone-Acetaminophen Nausea And Vomiting   Hydromorphone Hcl Nausea And Vomiting   Morphine Nausea And Vomiting   Pseudoephedrine Hcl Nausea And Vomiting   Other Nausea Only    General anesthesia    Zofran [Ondansetron] Other (See Comments)    Headache    ROS Review of Systems    Objective:    Physical Exam  BP 115/75    Pulse 79    SpO2 99%  Wt Readings from Last 3 Encounters:  07/21/21 123 lb (55.8 kg)  07/20/21 124 lb (56.2 kg)  05/12/21 122 lb 4 oz (55.5 kg)     Health Maintenance Due  Topic Date Due   COVID-19 Vaccine (5 - Booster for Pfizer series) 08/09/2021   Zoster Vaccines- Shingrix (2 of 2) 09/08/2021    There are no preventive care reminders to display for this patient.  Lab Results  Component Value Date   TSH 1.52 07/20/2021   Lab Results  Component Value Date   WBC 7.7 01/08/2021   HGB 12.4 01/08/2021   HCT 37.9 01/08/2021   MCV 89.6 01/08/2021   PLT 260 01/08/2021   Lab Results  Component Value Date   NA 141 10/07/2021   K 4.2 10/07/2021   CO2 20 10/07/2021   GLUCOSE 77 10/07/2021   BUN 18 10/07/2021   CREATININE 0.71 10/07/2021   BILITOT 0.2 10/07/2021   ALKPHOS 55 10/07/2021   AST 17 10/07/2021   ALT 13 10/07/2021   PROT 6.6 10/07/2021   ALBUMIN 4.7 10/07/2021   CALCIUM 9.2 10/07/2021    ANIONGAP 7 01/08/2021   EGFR 90 10/07/2021   GFR 84.77 07/20/2021   Lab Results  Component Value Date   CHOL 246 (H) 09/12/2019   Lab Results  Component Value Date   HDL 88 09/12/2019  Lab Results  Component Value Date   LDLCALC 134 (H) 09/12/2019   Lab Results  Component Value Date   TRIG 138 09/12/2019   Lab Results  Component Value Date   CHOLHDL 2.8 09/12/2019   No results found for: HGBA1C    Assessment & Plan:  Prolia injection - Patient tolerated injection well without complications. Patient advised to schedule next injection 6 months from today.    Problem List Items Addressed This Visit     Osteoporosis - Primary    Meds ordered this encounter  Medications   denosumab (PROLIA) injection 60 mg    Order Specific Question:   Patient is enrolled in [facility]S program for this medication and I have provided a copy of the Prolia Medication Guide and Patient Brochure.    Answer:   No    Order Specific Question:   I have reviewed with the patient the information in the Prolia Medication Guide and Patient Counseling Chart including the serious risks of Prolia and symptoms of each risk.    Answer:   Yes    Order Specific Question:   I have advised the patient to seek medical attention if they have signs or symptoms of any of the serious risks.    Answer:   Yes    Follow-up: Return in about 6 months (around 04/12/2022) for Prolia injection.Durene Romans, Monico Blitz, [place]

## 2021-10-13 NOTE — Progress Notes (Signed)
Agree with documentation as above.   Gianelle Mccaul, MD  

## 2022-01-05 ENCOUNTER — Telehealth: Payer: Medicare Other

## 2022-01-19 ENCOUNTER — Ambulatory Visit (INDEPENDENT_AMBULATORY_CARE_PROVIDER_SITE_OTHER): Payer: Medicare Other | Admitting: Family Medicine

## 2022-01-19 ENCOUNTER — Encounter: Payer: Self-pay | Admitting: Family Medicine

## 2022-01-19 VITALS — BP 106/70 | HR 80 | Resp 16 | Ht 60.0 in | Wt 125.0 lb

## 2022-01-19 DIAGNOSIS — H93293 Other abnormal auditory perceptions, bilateral: Secondary | ICD-10-CM | POA: Diagnosis not present

## 2022-01-19 DIAGNOSIS — G40A09 Absence epileptic syndrome, not intractable, without status epilepticus: Secondary | ICD-10-CM | POA: Diagnosis not present

## 2022-01-19 DIAGNOSIS — F411 Generalized anxiety disorder: Secondary | ICD-10-CM

## 2022-01-19 DIAGNOSIS — E559 Vitamin D deficiency, unspecified: Secondary | ICD-10-CM

## 2022-01-19 MED ORDER — SERTRALINE HCL 25 MG PO TABS: ORAL_TABLET | ORAL | 0 refills | Status: DC

## 2022-01-19 MED ORDER — VITAMIN D (ERGOCALCIFEROL) 1.25 MG (50000 UNIT) PO CAPS: 50000.0000 [IU] | ORAL_CAPSULE | ORAL | 1 refills | Status: DC

## 2022-01-19 NOTE — Progress Notes (Signed)
Established Patient Office Visit  Subjective   Patient ID: Valerie Fisher, female    DOB: 10-19-1950  Age: 71 y.o. MRN: [id number]  Chief Complaint  Patient presents with   Anxiety    Follow up. Patient stated she is feeling much better and would like to wean off of Zoloft.   Discuss Medication    Patient would like to discuss prescribing Vitamin D    Ear Pain    Behind right ear for several years    HPI  GAD - Follow up. Patient stated she is feeling much better and would like to wean off of Zoloft.  Says she forgot to pack the medication when she went to the beach recently and it says it really upset her stomach coming off of it abruptly.  Has had difficulty finding a high enough dose vitamin D that was a gummy.  And she says they are so sugary she really does not like to eat those.  She would like to go back on prescription vitamin D if possible.  She has taken this in the past.  Pain behind ears.  He says for years she will get a pain or build up in the back of her neck and then she knows that her ears are going to feel full and like she cannot hear her.  It happened again recently when she and her husband went to the beach.  It was like that almost the whole time she was there.  It did gradually get better.  She has not had any drainage or significant pain it is really more fullness.     ROS    Objective:     BP 106/70   Pulse 80   Resp 16   Ht 5' (1.524 m)   Wt 125 lb (56.7 kg)   SpO2 95%   BMI 24.41 kg/m    Physical Exam Vitals and nursing note reviewed.  Constitutional:      Appearance: She is well-developed.  HENT:     Head: Normocephalic and atraumatic.     Right Ear: Tympanic membrane and ear canal normal.     Left Ear: Tympanic membrane and ear canal normal.  Cardiovascular:     Rate and Rhythm: Normal rate and regular rhythm.     Heart sounds: Normal heart sounds.  Pulmonary:     Effort: Pulmonary effort is normal.     Breath sounds: Normal breath  sounds.  Musculoskeletal:     Cervical back: Neck supple. No tenderness.  Skin:    General: Skin is warm and dry.  Neurological:     Mental Status: She is alert and oriented to person, place, and time.  Psychiatric:        Behavior: Behavior normal.     No results found for any visits on 01/19/22.    The 10-year ASCVD risk score (Arnett DK, et al., 2019) is: 9.2%    Assessment & Plan:   Problem List Items Addressed This Visit       Other   Vitamin D deficiency    Discussed pros and cons of prescription vitamin D which is vita vitamin D2 versus over-the-counter which is vitamin D3 which is the more active form.  She would prefer to go back to the prescription.       Relevant Medications   Vitamin D, Ergocalciferol, (DRISDOL) 1.25 MG (50000 UNIT) CAPS capsule   GAD (generalized anxiety disorder) - Primary    Go ahead and taper down  the sertraline to 50 mg for 4 weeks, 25 mg for 2 weeks and then 25 mg every other day for 4 weeks.  We discussed that if any point she has difficulty with the taper feels like she is getting an increase in anxiety symptoms to please let me know and we can either stop or slow down the taper.       Relevant Medications   sertraline (ZOLOFT) 25 MG tablet   Auditory complaints of both ears    The intermittent ear fullness is bothersome and has been going on for years.  She says she found the name of a specialist in [place] that she would be interested in seeing.  She will send me that name and information through [place].       Absence epilepsy, not intractable, without status epilepticus ([place])    Followed by Dr. Alma Friendly at [facility].  Currently on Keppra.  No recent changes to medication regimen.        Return in about 6 months (around 07/21/2022) for Mood and bones.    Beatrice Lecher, MD

## 2022-01-19 NOTE — Patient Instructions (Signed)
Decrease your sertraline to 50 mg daily for 4 weeks.  Then '25mg'$  daily for 2 weeks, Then '25mg'$  every other day for 2 weeks.

## 2022-01-19 NOTE — Assessment & Plan Note (Signed)
The intermittent ear fullness is bothersome and has been going on for years.  She says she found the name of a specialist in [place] that she would be interested in seeing.  She will send me that name and information through [place].

## 2022-01-19 NOTE — Assessment & Plan Note (Signed)
Followed by Dr. Alma Friendly at [facility].  Currently on Keppra.  No recent changes to medication regimen.

## 2022-01-19 NOTE — Assessment & Plan Note (Signed)
Discussed pros and cons of prescription vitamin D which is vita vitamin D2 versus over-the-counter which is vitamin D3 which is the more active form.  She would prefer to go back to the prescription.

## 2022-01-19 NOTE — Assessment & Plan Note (Signed)
Go ahead and taper down the sertraline to 50 mg for 4 weeks, 25 mg for 2 weeks and then 25 mg every other day for 4 weeks.  We discussed that if any point she has difficulty with the taper feels like she is getting an increase in anxiety symptoms to please let me know and we can either stop or slow down the taper.

## 2022-01-21 ENCOUNTER — Ambulatory Visit: Payer: Medicare Other | Admitting: Internal Medicine

## 2022-01-22 ENCOUNTER — Ambulatory Visit: Payer: Medicare Other | Admitting: Internal Medicine

## 2022-01-29 ENCOUNTER — Telehealth: Payer: Medicare Other

## 2022-01-29 ENCOUNTER — Telehealth: Payer: Self-pay | Admitting: Pharmacist

## 2022-01-29 NOTE — Telephone Encounter (Signed)
Unsuccessful attempt x2 to contact patient for follow-up phone call for CCM services with pharmacist.  Will route to schedule team for reschedule.  Saydee Zolman, PharmD Clinical Pharmacist [facility] [place] [place] [phone]   

## 2022-01-29 NOTE — Progress Notes (Incomplete)
Chronic Care Management Pharmacy Note  01/29/2022 Name:  Valerie Fisher MRN:  [id number] DOB:  [birth year]  Summary: addressed mental health, osteoporosis, epilepsy, epigastric pain. Still working with GI for stomach pain workup. She self-reduced sertraline to $RemoveBefor'50mg'[id number]$  and has been at this dose for about 1 month.  Recommendations/Changes made from today's visit: none, recommended she discuss w/PCP if her goal is to come off sertraline altogether.   Plan: f/u with pharmacist in 6 months  Subjective: Valerie Fisher is an 71 y.o. year old female who is a primary patient of Metheney, Rene Kocher, MD.  The CCM team was consulted for assistance with disease management and care coordination needs.    Engaged with patient by telephone for follow up visit in response to provider referral for pharmacy case management and/or care coordination services.   Consent to Services:  The patient was given information about Chronic Care Management services, agreed to services, and gave verbal consent prior to initiation of services.  Please see initial visit note for detailed documentation.   Patient Care Team: Hali Marry, MD as PCP - General (Family Medicine) Darius Bump, [facility] as Pharmacist (Pharmacist)   Objective:  Lab Results  Component Value Date   CREATININE 0.71 10/07/2021   CREATININE 0.71 07/20/2021   CREATININE 0.75 03/27/2021       Component Value Date/Time   CHOL 246 (H) 09/12/2019 1321   TRIG 138 09/12/2019 1321   HDL 88 09/12/2019 1321   CHOLHDL 2.8 09/12/2019 1321   CHOLHDL 2.9 09/07/2019 1434   LDLCALC 134 (H) 09/12/2019 1321   LDLCALC 123 (H) 09/07/2019 1434   LDLCALC CANCELED 09/07/2019 1434       Latest Ref Rng & Units 10/07/2021    2:06 PM 03/27/2021   11:58 AM 01/07/2021    7:15 AM  Hepatic Function  Total Protein 6.0 - 8.5 g/dL 6.6  7.1  6.7   Albumin 3.7 - 4.7 g/dL 4.7  4.9  4.0   AST 0 - 40 IU/L $Remov'17  15  16   'MWKKjM$ ALT 0 - 32 IU/L $Remov'13  9  11   'DyjIHM$ Alk Phosphatase  44 - 121 IU/L 55  86  43   Total Bilirubin 0.0 - 1.2 mg/dL 0.2  0.4  1.0     Lab Results  Component Value Date/Time   TSH 1.52 07/20/2021 02:40 PM   TSH 1.650 09/12/2019 01:21 PM   FREET4 0.71 07/20/2021 02:40 PM       Latest Ref Rng & Units 01/08/2021    1:29 AM 01/07/2021    7:15 AM 09/22/2020    2:03 PM  CBC  WBC 4.0 - 10.5 K/uL 7.7  5.2  5.1   Hemoglobin 12.0 - 15.0 g/dL 12.4  14.7  15.2   Hematocrit 36.0 - 46.0 % 37.9  45.3  45.6   Platelets 150 - 400 K/uL 260  302  327     Social History   Tobacco Use  Smoking Status Never  Smokeless Tobacco Never   BP Readings from Last 3 Encounters:  01/19/22 106/70  10/13/21 115/75  07/21/21 114/80   Pulse Readings from Last 3 Encounters:  01/19/22 80  10/13/21 79  07/21/21 88   Wt Readings from Last 3 Encounters:  01/19/22 125 lb (56.7 kg)  07/21/21 123 lb (55.8 kg)  07/20/21 124 lb (56.2 kg)    Assessment: Review of patient past medical history, allergies, medications, health status, including review of consultants reports, laboratory and other test data,  was performed as part of comprehensive evaluation and provision of chronic care management services.   SDOH:  (Social Determinants of Health) assessments and interventions performed:    CCM Care Plan  Allergies  Allergen Reactions   Aspirin Other (See Comments)    GI upset    Hydrocodone-Acetaminophen Nausea And Vomiting   Hydromorphone Hcl Nausea And Vomiting   Morphine Nausea And Vomiting   Pseudoephedrine Hcl Nausea And Vomiting   Other Nausea Only    General anesthesia    Zofran [Ondansetron] Other (See Comments)    Headache    Medications Reviewed Today     Reviewed by Izora Gala, CMA (Certified Medical Assistant) on 01/19/22 at 1348  Med List Status: <None>   Medication Order Taking? Sig Documenting Provider Last Dose Status Informant  0.9 %  sodium chloride infusion [id number]   Cirigliano, Dominic Pea, DO  Active   aspirin-acetaminophen-caffeine  (EXCEDRIN MIGRAINE) [phone] MG tablet [id number] Yes Take 1 tablet by mouth every 6 (six) hours as needed for headache. [provider] Taking Active Self  Calcium Carbonate-Vit D-Min (CALCIUM 1200 PO) [id number] Yes Take 1 tablet by mouth daily. [provider] Taking Active   Cholecalciferol (VITAMIN D) 125 MCG (5000 UT) CAPS [id number] Yes Take 5,000 Units by mouth daily. [provider] Taking Active   denosumab (PROLIA) 60 MG/ML SOSY injection [id number] Yes Inject 60 mg into the skin every 6 (six) months. [provider] Taking Active Self           Med Note Darra Lis Feb 17, 2021  1:53 PM) Due August 2022  fluticasone [facility]) 50 MCG/ACT nasal spray [id number] Yes Place 1 spray into both nostrils daily as needed for allergies or rhinitis. [provider] Taking Active Self  levETIRAcetam (KEPPRA) 500 MG tablet [id number] Yes Take 1,000 mg by mouth See admin instructions. Take 1000 mg in the morning and 1000 mg in the evening Sam, Aline August, MD Taking Active Self           Med Note Izora Gala   Tue Jan 19, 2022  1:47 PM) Neurologist   Melatonin 5 MG CAPS [id number] Yes Take 5 mg by mouth at bedtime as needed (sleep). [provider] Taking Active Self  sertraline (ZOLOFT) 100 MG tablet [id number] Yes Take 1 tablet (100 mg total) by mouth daily. Hali Marry, MD Taking Active   SUMAtriptan [facility]) 20 MG/ACT nasal spray [id number] Yes Place 1 spray (20 mg total) into the nose every 2 (two) hours as needed for migraine. May repeat in 2 hours if headache persists or recurs. Cirigliano, Vito V, DO Taking Active   zonisamide (ZONEGRAN) 100 MG capsule [id number] Yes Take 200 mg by mouth at bedtime. 200 mg daily Merlene Morse, MD Taking Active Self            Patient Active Problem List   Diagnosis Date Noted   Vitamin D deficiency 01/19/2022   Compression fracture of thoracic vertebra with routine healing 04/14/2021    Incisional hernia 01/07/2021   Fatty liver 10/07/2020   Calculus of gallbladder without cholecystitis without obstruction 10/07/2020   Insomnia 05/07/2019   Right leg pain 05/07/2019   GAD (generalized anxiety disorder) 03/05/2019   History of right breast cancer 03/05/2019   H/O colectomy 02/15/2019   DDD (degenerative disc disease) 01/18/2019   History of diverticular abscess of colon 12/12/2017   Auditory complaints of both ears 04/04/2016   Osteoporosis 03/28/2015  Therapeutic drug monitoring 01/24/2014   Absence epilepsy, not intractable, without status epilepticus ([place]) 05/15/2013   Generalized nonconvulsive epilepsy ([place]) 05/15/2013   Rotator cuff tendonitis 04/23/2013   Internal hemorrhoids 03/30/2013    Immunization History  Administered Date(s) Administered   Fluad Quad(high Dose 65+) 05/02/2019, 05/30/2020, 04/14/2021   Influenza, High Dose Seasonal PF 06/03/2018   Influenza, Seasonal, Injecte, Preservative Fre 05/17/2015   PFIZER(Purple Top)SARS-COV-2 Vaccination 09/16/2019, 10/14/2019, 11/07/2019, 06/14/2021   Pneumococcal Conjugate-13 08/26/2014   Pneumococcal Polysaccharide-23 09/08/2015   Tdap 07/26/2008, 08/04/2018   Zoster Recombinat (Shingrix) 07/14/2021, 10/14/2021   Zoster, Live 08/15/2014    Conditions to be addressed/monitored: Anxiety, osteoporosis, epilepsy  There are no care plans that you recently modified to display for this patient.     Medication Assistance: None required.  Patient affirms current coverage meets needs.  Patient's preferred pharmacy is:  [place], [place] - [address] [place] [address] Phone: [phone] Fax: [phone]  [facility] ([place], [place] [facility] [address] [place] [address] [place] [address] Phone: [phone] Fax: [phone]   Uses pill box? Yes Pt endorses 100%  compliance  Follow Up:  Patient agrees to Care Plan and Follow-up.  Plan: Telephone follow up appointment with care management team member scheduled for:  6 months  Darius Bump

## 2022-01-31 ENCOUNTER — Other Ambulatory Visit: Payer: Self-pay | Admitting: Family Medicine

## 2022-01-31 DIAGNOSIS — F411 Generalized anxiety disorder: Secondary | ICD-10-CM

## 2022-02-12 ENCOUNTER — Telehealth: Payer: Medicare Other

## 2022-02-15 ENCOUNTER — Ambulatory Visit (INDEPENDENT_AMBULATORY_CARE_PROVIDER_SITE_OTHER): Payer: Medicare Other | Admitting: Pharmacist

## 2022-02-15 DIAGNOSIS — M810 Age-related osteoporosis without current pathological fracture: Secondary | ICD-10-CM

## 2022-02-15 DIAGNOSIS — F411 Generalized anxiety disorder: Secondary | ICD-10-CM

## 2022-02-15 DIAGNOSIS — G40A09 Absence epileptic syndrome, not intractable, without status epilepticus: Secondary | ICD-10-CM

## 2022-02-15 NOTE — Progress Notes (Signed)
Chronic Care Management Pharmacy Note  02/19/2022 Name:  Valerie Fisher MRN:  [id number] DOB:  Jun 30, 1951  Summary: addressed mental health, osteoporosis, epilepsy. She is slowly reducing sertraline to come off altogether.  She is seeking a different osteoporosis treatment prior to next injection of prolia due 04/12/22, related to cost.  She reports she experiences "outer ear and mastoid bone pain, but imaging came back fine."   Recommendations/Changes made from today's visit: none, after investigation of alternatives - prolia is still the most appropriate medication from both a clinical and cost efficacy standpoint.  Plan: f/u with pharmacist in 2 weeks to discuss osteoporosis regimen  Subjective: Valerie Fisher is an 71 y.o. year old female who is a primary patient of Metheney, Rene Kocher, MD.  The CCM team was consulted for assistance with disease management and care coordination needs.    Engaged with patient by telephone for follow up visit in response to provider referral for pharmacy case management and/or care coordination services.   Consent to Services:  The patient was given information about Chronic Care Management services, agreed to services, and gave verbal consent prior to initiation of services.  Please see initial visit note for detailed documentation.   Patient Care Team: Hali Marry, MD as PCP - General (Family Medicine) Darius Bump, [facility] as Pharmacist (Pharmacist)   Objective:  Lab Results  Component Value Date   CREATININE 0.71 10/07/2021   CREATININE 0.71 07/20/2021   CREATININE 0.75 03/27/2021       Component Value Date/Time   CHOL 246 (H) 09/12/2019 1321   TRIG 138 09/12/2019 1321   HDL 88 09/12/2019 1321   CHOLHDL 2.8 09/12/2019 1321   CHOLHDL 2.9 09/07/2019 1434   LDLCALC 134 (H) 09/12/2019 1321   LDLCALC 123 (H) 09/07/2019 1434   LDLCALC CANCELED 09/07/2019 1434       Latest Ref Rng & Units 10/07/2021    2:06 PM 03/27/2021    11:58 AM 01/07/2021    7:15 AM  Hepatic Function  Total Protein 6.0 - 8.5 g/dL 6.6  7.1  6.7   Albumin 3.7 - 4.7 g/dL 4.7  4.9  4.0   AST 0 - 40 IU/L _0 ALT 0 - 32 IU/L _1 Alk Phosphatase 44 - 121 IU/L 55  86  43   Total Bilirubin 0.0 - 1.2 mg/dL 0.2  0.4  1.0     Lab Results  Component Value Date/Time   TSH 1.52 07/20/2021 02:40 PM   TSH 1.650 09/12/2019 01:21 PM   FREET4 0.71 07/20/2021 02:40 PM       Latest Ref Rng & Units 01/08/2021    1:29 AM 01/07/2021    7:15 AM 09/22/2020    2:03 PM  CBC  WBC 4.0 - 10.5 K/uL 7.7  5.2  5.1   Hemoglobin 12.0 - 15.0 g/dL 12.4  14.7  15.2   Hematocrit 36.0 - 46.0 % 37.9  45.3  45.6   Platelets 150 - 400 K/uL 260  302  327     Social History   Tobacco Use  Smoking Status Never  Smokeless Tobacco Never   BP Readings from Last 3 Encounters:  01/19/22 106/70  10/13/21 115/75  07/21/21 114/80   Pulse Readings from Last 3 Encounters:  01/19/22 80  10/13/21 79  07/21/21 88   Wt Readings from Last 3 Encounters:  01/19/22 125 lb (56.7 kg)  07/21/21 123 lb (55.8 kg)  07/20/21 124 lb (56.2 kg)    Assessment: Review of patient past medical history, allergies, medications, health status, including review of consultants reports, laboratory and other test data, was performed as part of comprehensive evaluation and provision of chronic care management services.   SDOH:  (Social Determinants of Health) assessments and interventions performed:    CCM Care Plan  Allergies  Allergen Reactions   Aspirin Other (See Comments)    GI upset    Hydrocodone-Acetaminophen Nausea And Vomiting   Hydromorphone Hcl Nausea And Vomiting   Morphine Nausea And Vomiting   Pseudoephedrine Hcl Nausea And Vomiting   Other Nausea Only    General anesthesia    Zofran [Ondansetron] Other (See Comments)    Headache    Medications Reviewed Today     Reviewed by Darius Bump, [facility] (Pharmacist) on 02/15/22 at 1201  Med List Status:  <None>   Medication Order Taking? Sig Documenting Provider Last Dose Status Informant  0.9 %  sodium chloride infusion [id number]   Cirigliano, Dominic Pea, DO  Active   aspirin-acetaminophen-caffeine (EXCEDRIN MIGRAINE) [phone] MG tablet [id number] Yes Take 1 tablet by mouth every 6 (six) hours as needed for headache. [provider] Taking Active Self  Calcium Carbonate-Vit D-Min (CALCIUM 1200 PO) [id number] Yes Take 1 tablet by mouth daily. [provider] Taking Active   denosumab (PROLIA) 60 MG/ML SOSY injection [id number] Yes Inject 60 mg into the skin every 6 (six) months. [provider] Taking Active Self           Med Note Darra Lis Feb 17, 2021  1:53 PM) Due August 2022  fluticasone [facility]) 50 MCG/ACT nasal spray [id number] Yes Place 1 spray into both nostrils daily as needed for allergies or rhinitis. [provider] Taking Active Self  levETIRAcetam (KEPPRA) 500 MG tablet [id number] Yes Take 1,000 mg by mouth See admin instructions. Take 1000 mg in the morning and 1000 mg in the evening Sam, Aline August, MD Taking Active Self           Med Note Izora Gala   Tue Jan 19, 2022  1:47 PM) Neurologist   Melatonin 5 MG CAPS [id number] No Take 5 mg by mouth at bedtime as needed (sleep).  Patient not taking: Reported on 02/15/2022   [provider] Not Taking Active Self  sertraline (ZOLOFT) 25 MG tablet [id number] Yes Take 1 tablet (25 mg total) by mouth daily for 14 days, THEN 1 tablet (25 mg total) every other day for 14 days. Hali Marry, MD Taking Active   SUMAtriptan [facility]) 20 MG/ACT nasal spray [id number] No Place 1 spray (20 mg total) into the nose every 2 (two) hours as needed for migraine. May repeat in 2 hours if headache persists or recurs.  Patient not taking: Reported on 02/15/2022   Lavena Bullion, DO Not Taking Active   Vitamin D, Ergocalciferol, (DRISDOL) 1.25 MG (50000 UNIT) CAPS capsule [id number] Yes Take 1  capsule (50,000 Units total) by mouth every 7 (seven) days. Hali Marry, MD Taking Active   zonisamide [facility]) 100 MG capsule [id number] Yes Take 200 mg by mouth at bedtime. 200 mg daily Merlene Morse, MD Taking Active Self            Patient Active Problem List   Diagnosis Date Noted   Vitamin D deficiency 01/19/2022   Compression fracture of thoracic vertebra with routine healing 04/14/2021   Incisional hernia 01/07/2021   Fatty  liver 10/07/2020   Calculus of gallbladder without cholecystitis without obstruction 10/07/2020   Insomnia 05/07/2019   Right leg pain 05/07/2019   GAD (generalized anxiety disorder) 03/05/2019   History of right breast cancer 03/05/2019   H/O colectomy 02/15/2019   DDD (degenerative disc disease) 01/18/2019   History of diverticular abscess of colon 12/12/2017   Auditory complaints of both ears 04/04/2016   Osteoporosis 03/28/2015   Therapeutic drug monitoring 01/24/2014   Absence epilepsy, not intractable, without status epilepticus ([place]) 05/15/2013   Generalized nonconvulsive epilepsy ([place]) 05/15/2013   Rotator cuff tendonitis 04/23/2013   Internal hemorrhoids 03/30/2013    Immunization History  Administered Date(s) Administered   Fluad Quad(high Dose 65+) 05/02/2019, 05/30/2020, 04/14/2021   Influenza, High Dose Seasonal PF 06/03/2018   Influenza, Seasonal, Injecte, Preservative Fre 05/17/2015   PFIZER(Purple Top)SARS-COV-2 Vaccination 09/16/2019, 10/14/2019, 11/07/2019, 06/14/2021   Pneumococcal Conjugate-13 08/26/2014   Pneumococcal Polysaccharide-23 09/08/2015   Tdap 07/26/2008, 08/04/2018   Zoster Recombinat (Shingrix) 07/14/2021, 10/14/2021   Zoster, Live 08/15/2014    Conditions to be addressed/monitored: Anxiety, osteoporosis, epilepsy  Care Plan : Medication Management  Updates made by Darius Bump, [place] since 02/19/2022 12:00 AM     Problem: Depression/Anxiety, Osteoporosis, Epilepsy, Epigastric Pain       Long-Range Goal: Disease Progression Prevention   Start Date: 02/17/2021  Recent Progress: On track  Priority: High  Note:   Current Barriers:  None at present  Pharmacist Clinical Goal(s):  Over the next 180 days, patient will maintain control of chronic conditions as evidenced by medication fill history, lab values, and vital signs  through collaboration with PharmD and provider.   Interventions: 1:1 collaboration with Hali Marry, MD regarding development and update of comprehensive plan of care as evidenced by provider attestation and co-signature Inter-disciplinary care team collaboration (see longitudinal plan of care) Comprehensive medication review performed; medication list updated in electronic medical record  Depression/Anxiety:  Controlled; current treatment: sertraline tapering down, doing well;   Recommended continue current regimen Osteoporosis:  Current treatment:prolia Q24month;   Last DEXA: 08/24/2019, t score -2.5  Supplementation: calcium carbonate 12045m+ vit D 5000IU daily  Epilepsy  Controlled; current treatment:keppra 50058mM, 1000m1m, and zonisamide 400mg1mbedtime;   Managed by neurology, titrating off keprra d/t patient reports of feeling foggy/spacey   Recommended continue current regimen, Epigastric Pain  Controlled; current treatment: prevacid;   Managed by GI  Recommended continue current regimen  Patient Goals/Self-Care Activities Over the next 14 days, patient will:  take medications as prescribed  Follow Up Plan: Telephone follow up appointment with care management team member scheduled for:  2 weeks       Medication Assistance: None required.  Patient affirms current coverage meets needs.  Patient's preferred pharmacy is:  [facility] [place]: [phone] ([facility] [place] [address] [place] [address] [place]: [phone] pill box? Yes Pt endorses 100% compliance  Follow Up:  Patient agrees to Care Plan and Follow-up.  Plan: Telephone follow up appointment with care management team member scheduled for:  2 weeks  KeeshLarinda ButteryrmD Clinical Pharmacist [facility] [phone]

## 2022-02-19 NOTE — Patient Instructions (Signed)
Visit Information  Thank you for taking time to visit with me today. Please don't hesitate to contact me if I can be of assistance to you before our next scheduled telephone appointment.  Following are the goals we discussed today:  Patient Goals/Self-Care Activities Over the next 14 days, patient will:  take medications as prescribed  Follow Up Plan: Telephone follow up appointment with care management team member scheduled for:  2 weeks  Please call the care guide team at [phone] if you need to cancel or reschedule your appointment.    Patient verbalizes understanding of instructions and care plan provided today and agrees to view in [place]. Active MyChart status and patient understanding of how to access instructions and care plan via MyChart confirmed with patient.     Darius Bump

## 2022-03-08 ENCOUNTER — Ambulatory Visit: Payer: Medicare Other | Admitting: Pharmacist

## 2022-03-08 DIAGNOSIS — M810 Age-related osteoporosis without current pathological fracture: Secondary | ICD-10-CM

## 2022-03-08 DIAGNOSIS — F411 Generalized anxiety disorder: Secondary | ICD-10-CM

## 2022-03-08 DIAGNOSIS — G40A09 Absence epileptic syndrome, not intractable, without status epilepticus: Secondary | ICD-10-CM

## 2022-03-08 NOTE — Progress Notes (Signed)
Chronic Care Management Pharmacy Note  03/08/2022 Name:  Valerie Fisher MRN:  [id number] DOB:  [birth year]  Summary: addressed mental health, osteoporosis, epilepsy. She is slowly reducing sertraline to come off altogether.  She is seeking a different osteoporosis treatment prior to next injection of prolia due 04/12/22, related to cost.  Recommendations/Changes made from today's visit: none, after investigation of alternatives - prolia is still the most appropriate medication from both a clinical and cost efficacy standpoint.  Plan: f/u with pharmacist in 6 months  Subjective: Valerie Fisher is an 71 y.o. year old female who is a primary patient of Metheney, Rene Kocher, MD.  The CCM team was consulted for assistance with disease management and care coordination needs.    Engaged with patient by telephone for follow up visit in response to provider referral for pharmacy case management and/or care coordination services.   Consent to Services:  The patient was given information about Chronic Care Management services, agreed to services, and gave verbal consent prior to initiation of services.  Please see initial visit note for detailed documentation.   Patient Care Team: Hali Marry, MD as PCP - General (Family Medicine) Valerie Fisher, [facility] as Pharmacist (Pharmacist)   Objective:  Lab Results  Component Value Date   CREATININE 0.71 10/07/2021   CREATININE 0.71 07/20/2021   CREATININE 0.75 03/27/2021       Component Value Date/Time   CHOL 246 (H) 09/12/2019 1321   TRIG 138 09/12/2019 1321   HDL 88 09/12/2019 1321   CHOLHDL 2.8 09/12/2019 1321   CHOLHDL 2.9 09/07/2019 1434   LDLCALC 134 (H) 09/12/2019 1321   LDLCALC 123 (H) 09/07/2019 1434   LDLCALC CANCELED 09/07/2019 1434       Latest Ref Rng & Units 10/07/2021    2:06 PM 03/27/2021   11:58 AM 01/07/2021    7:15 AM  Hepatic Function  Total Protein 6.0 - 8.5 g/dL 6.6  7.1  6.7   Albumin 3.7 - 4.7 g/dL 4.7   4.9  4.0   AST 0 - 40 IU/L $Remov'17  15  16   'JvQjLu$ ALT 0 - 32 IU/L $Remov'13  9  11   'ZOcCon$ Alk Phosphatase 44 - 121 IU/L 55  86  43   Total Bilirubin 0.0 - 1.2 mg/dL 0.2  0.4  1.0     Lab Results  Component Value Date/Time   TSH 1.52 07/20/2021 02:40 PM   TSH 1.650 09/12/2019 01:21 PM   FREET4 0.71 07/20/2021 02:40 PM       Latest Ref Rng & Units 01/08/2021    1:29 AM 01/07/2021    7:15 AM 09/22/2020    2:03 PM  CBC  WBC 4.0 - 10.5 K/uL 7.7  5.2  5.1   Hemoglobin 12.0 - 15.0 g/dL 12.4  14.7  15.2   Hematocrit 36.0 - 46.0 % 37.9  45.3  45.6   Platelets 150 - 400 K/uL 260  302  327     Social History   Tobacco Use  Smoking Status Never  Smokeless Tobacco Never   BP Readings from Last 3 Encounters:  01/19/22 106/70  10/13/21 115/75  07/21/21 114/80   Pulse Readings from Last 3 Encounters:  01/19/22 80  10/13/21 79  07/21/21 88   Wt Readings from Last 3 Encounters:  01/19/22 125 lb (56.7 kg)  07/21/21 123 lb (55.8 kg)  07/20/21 124 lb (56.2 kg)    Assessment: Review of patient past medical history, allergies, medications, health status, including  review of consultants reports, laboratory and other test data, was performed as part of comprehensive evaluation and provision of chronic care management services.   SDOH:  (Social Determinants of Health) assessments and interventions performed:    CCM Care Plan  Allergies  Allergen Reactions   Aspirin Other (See Comments)    GI upset    Hydrocodone-Acetaminophen Nausea And Vomiting   Hydromorphone Hcl Nausea And Vomiting   Morphine Nausea And Vomiting   Pseudoephedrine Hcl Nausea And Vomiting   Other Nausea Only    General anesthesia    Zofran [Ondansetron] Other (See Comments)    Headache    Medications Reviewed Today     Reviewed by Valerie Fisher, [facility] (Pharmacist) on 03/08/22 at 1102  Med List Status: <None>   Medication Order Taking? Sig Documenting Provider Last Dose Status Informant  0.9 %  sodium chloride infusion  [id number]   Cirigliano, Verlin Dike, DO  Active   aspirin-acetaminophen-caffeine (EXCEDRIN MIGRAINE) [phone] MG tablet [id number] Yes Take 1 tablet by mouth every 6 (six) hours as needed for headache. [provider] Taking Active Self  Calcium Carbonate-Vit D-Min (CALCIUM 1200 PO) [id number] Yes Take 1 tablet by mouth daily. [provider] Taking Active   denosumab (PROLIA) 60 MG/ML SOSY injection [id number] Yes Inject 60 mg into the skin every 6 (six) months. [provider] Taking Active Self           Med Note Karsten Fells Feb 17, 2021  1:53 PM) Due August 2022  fluticasone [facility]) 50 MCG/ACT nasal spray [id number] Yes Place 1 spray into both nostrils daily as needed for allergies or rhinitis. [provider] Taking Active Self  levETIRAcetam (KEPPRA) 500 MG tablet [id number] Yes Take 1,000 mg by mouth See admin instructions. Take 1000 mg in the morning and 1000 mg in the evening Sam, Maudry Mayhew, MD Taking Active Self           Med Note Osborne Oman   Tue Jan 19, 2022  1:47 PM) Neurologist   Melatonin 5 MG CAPS [id number] Yes Take 5 mg by mouth at bedtime as needed (sleep). [provider] Taking Active Self  sertraline (ZOLOFT) 25 MG tablet [id number]  Take 1 tablet (25 mg total) by mouth daily for 14 days, THEN 1 tablet (25 mg total) every other day for 14 days. Agapito Games, MD  Expired 02/16/22 2359   SUMAtriptan (IMITREX) 20 MG/ACT nasal spray [id number] Yes Place 1 spray (20 mg total) into the nose every 2 (two) hours as needed for migraine. May repeat in 2 hours if headache persists or recurs. Cirigliano, Vito V, DO Taking Active   Vitamin D, Ergocalciferol, (DRISDOL) 1.25 MG (50000 UNIT) CAPS capsule [id number] Yes Take 1 capsule (50,000 Units total) by mouth every 7 (seven) days. Agapito Games, MD Taking Active   zonisamide [facility]) 100 MG capsule [id number] Yes Take 200 mg by mouth at bedtime. 200 mg daily Laurena Slimmer, MD  Taking Active Self            Patient Active Problem List   Diagnosis Date Noted   Vitamin D deficiency 01/19/2022   Compression fracture of thoracic vertebra with routine healing 04/14/2021   Incisional hernia 01/07/2021   Fatty liver 10/07/2020   Calculus of gallbladder without cholecystitis without obstruction 10/07/2020   Insomnia 05/07/2019   Right leg pain 05/07/2019   GAD (generalized anxiety disorder) 03/05/2019   History of right breast cancer 03/05/2019  H/O colectomy 02/15/2019   DDD (degenerative disc disease) 01/18/2019   History of diverticular abscess of colon 12/12/2017   Auditory complaints of both ears 04/04/2016   Osteoporosis 03/28/2015   Therapeutic drug monitoring 01/24/2014   Absence epilepsy, not intractable, without status epilepticus ([place]) 05/15/2013   Generalized nonconvulsive epilepsy ([place]) 05/15/2013   Rotator cuff tendonitis 04/23/2013   Internal hemorrhoids 03/30/2013    Immunization History  Administered Date(s) Administered   Fluad Quad(high Dose 65+) 05/02/2019, 05/30/2020, 04/14/2021   Influenza, High Dose Seasonal PF 06/03/2018   Influenza, Seasonal, Injecte, Preservative Fre 05/17/2015   PFIZER(Purple Top)SARS-COV-2 Vaccination 09/16/2019, 10/14/2019, 11/07/2019, 06/14/2021   Pneumococcal Conjugate-13 08/26/2014   Pneumococcal Polysaccharide-23 09/08/2015   Tdap 07/26/2008, 08/04/2018   Zoster Recombinat (Shingrix) 07/14/2021, 10/14/2021   Zoster, Live 08/15/2014    Conditions to be addressed/monitored: Anxiety, osteoporosis, epilepsy  Care Plan : Medication Management  Updates made by Valerie Fisher, [place] since 03/08/2022 12:00 AM     Problem: Depression/Anxiety, Osteoporosis, Epilepsy, Epigastric Pain      Long-Range Goal: Disease Progression Prevention   Start Date: 02/17/2021  Recent Progress: On track  Priority: High  Note:   Current Barriers:  None at present  Pharmacist Clinical Goal(s):  Over the next 180 days,  patient will maintain control of chronic conditions as evidenced by medication fill history, lab values, and vital signs  through collaboration with PharmD and provider.   Interventions: 1:1 collaboration with Hali Marry, MD regarding development and update of comprehensive plan of care as evidenced by provider attestation and co-signature Inter-disciplinary care team collaboration (see longitudinal plan of care) Comprehensive medication review performed; medication list updated in electronic medical record  Depression/Anxiety:  Controlled; current treatment: sertraline tapering down, doing well;   Recommended continue current regimen Osteoporosis:  Current treatment:prolia Q80months;   Last DEXA: 08/24/2019, t score -2.5  Supplementation: calcium carbonate 1200mg  + vit D 5000IU daily  Epilepsy  Controlled; current treatment:keppra 500mg  AM, 1000mg  PM, and zonisamide 400mg  at bedtime;   Managed by neurology, titrating off keprra d/t patient reports of feeling foggy/spacey   Recommended continue current regimen, Epigastric Pain  Controlled; current treatment: prevacid;   Managed by GI  Recommended continue current regimen  Patient Goals/Self-Care Activities Over the next 180 days, patient will:  take medications as prescribed  Follow Up Plan: Telephone follow up appointment with care management team member scheduled for:  46months        Medication Assistance: None required.  Patient affirms current coverage meets needs.  Patient's preferred pharmacy is:  [place], [place] - [place] [address] Phone: [phone] Fax: [phone]  [facility] ([place], [place] [facility] [address] [place] [address] [place] [address] Phone: [phone] Fax: [phone]   Uses pill box? Yes Pt endorses 100% compliance  Follow Up:  Patient agrees  to Care Plan and Follow-up.  Plan: Telephone follow up appointment with care management team member scheduled for:  6 months   Larinda Buttery, PharmD Clinical Pharmacist [facility] [phone]

## 2022-03-08 NOTE — Patient Instructions (Signed)
Visit Information  Thank you for taking time to visit with me today. Please don't hesitate to contact me if I can be of assistance to you before our next scheduled telephone appointment.  Following are the goals we discussed today:  Patient Goals/Self-Care Activities Over the next 180 days, patient will:  take medications as prescribed  Follow Up Plan: Telephone follow up appointment with care management team member scheduled for: 6 months  Please call the care guide team at [phone] if you need to cancel or reschedule your appointment.   Patient verbalizes understanding of instructions and care plan provided today and agrees to view in MyChart. Active MyChart status and patient understanding of how to access instructions and care plan via MyChart confirmed with patient.     Valerie Fisher J Valerie Fisher  

## 2022-03-15 DIAGNOSIS — M810 Age-related osteoporosis without current pathological fracture: Secondary | ICD-10-CM | POA: Diagnosis not present

## 2022-04-06 ENCOUNTER — Other Ambulatory Visit: Payer: Self-pay

## 2022-04-06 DIAGNOSIS — M810 Age-related osteoporosis without current pathological fracture: Secondary | ICD-10-CM

## 2022-04-06 NOTE — Progress Notes (Signed)
Lab orders prior to Prolia injection.  Charyl Bigger, CMA

## 2022-04-07 ENCOUNTER — Encounter: Payer: Self-pay | Admitting: General Practice

## 2022-04-07 LAB — CMP14+EGFR
ALT: 11 IU/L (ref 0–32)
AST: 19 IU/L (ref 0–40)
Albumin/Globulin Ratio: 1.8 (ref 1.2–2.2)
Albumin: 4.4 g/dL (ref 3.8–4.8)
Alkaline Phosphatase: 55 IU/L (ref 44–121)
BUN/Creatinine Ratio: 30 — ABNORMAL HIGH (ref 12–28)
BUN: 25 mg/dL (ref 8–27)
Bilirubin Total: 0.4 mg/dL (ref 0.0–1.2)
CO2: 21 mmol/L (ref 20–29)
Calcium: 9.7 mg/dL (ref 8.7–10.3)
Chloride: 103 mmol/L (ref 96–106)
Creatinine, Ser: 0.82 mg/dL (ref 0.57–1.00)
Globulin, Total: 2.4 g/dL (ref 1.5–4.5)
Glucose: 94 mg/dL (ref 70–99)
Potassium: 4.4 mmol/L (ref 3.5–5.2)
Sodium: 138 mmol/L (ref 134–144)
Total Protein: 6.8 g/dL (ref 6.0–8.5)
eGFR: 75 mL/min/{1.73_m2} (ref >59)

## 2022-04-07 NOTE — Progress Notes (Signed)
Your lab work is within acceptable range and there are no concerning findings.   ?

## 2022-04-12 ENCOUNTER — Ambulatory Visit: Payer: Medicare Other

## 2022-04-12 ENCOUNTER — Ambulatory Visit (INDEPENDENT_AMBULATORY_CARE_PROVIDER_SITE_OTHER): Payer: Medicare Other | Admitting: Family Medicine

## 2022-04-12 VITALS — BP 108/70 | HR 57

## 2022-04-12 DIAGNOSIS — M810 Age-related osteoporosis without current pathological fracture: Secondary | ICD-10-CM

## 2022-04-12 MED ORDER — DENOSUMAB 60 MG/ML ~~LOC~~ SOSY
60.0000 mg | PREFILLED_SYRINGE | Freq: Once | SUBCUTANEOUS | Status: AC
  Administered 2022-04-12: 60 mg via SUBCUTANEOUS

## 2022-04-12 NOTE — Progress Notes (Signed)
Agree with documentation as above.   Aizlynn Digilio, MD  

## 2022-04-12 NOTE — Progress Notes (Signed)
HPI:  Patient is here for Prolia injection.  Pt reports taking calcium and vitamin D daily.  Pt's calcium levels and kidney functions was within normal limits.  Assessment and Plan:  Patient tolerated injection well without complications.  Pt advised to schedule next injection in 6 months.  Charyl Bigger, CMA

## 2022-05-24 ENCOUNTER — Other Ambulatory Visit: Payer: Self-pay | Admitting: Gastroenterology

## 2022-05-24 DIAGNOSIS — K2970 Gastritis, unspecified, without bleeding: Secondary | ICD-10-CM

## 2022-05-28 ENCOUNTER — Ambulatory Visit (INDEPENDENT_AMBULATORY_CARE_PROVIDER_SITE_OTHER): Payer: Medicare Other | Admitting: Family Medicine

## 2022-05-28 VITALS — BP 118/80 | HR 87 | Temp 98.9°F | Ht 60.0 in | Wt 124.0 lb

## 2022-05-28 DIAGNOSIS — R051 Acute cough: Secondary | ICD-10-CM

## 2022-05-28 DIAGNOSIS — L987 Excessive and redundant skin and subcutaneous tissue: Secondary | ICD-10-CM

## 2022-05-28 DIAGNOSIS — J029 Acute pharyngitis, unspecified: Secondary | ICD-10-CM | POA: Diagnosis not present

## 2022-05-28 MED ORDER — FLUTICASONE PROPIONATE 50 MCG/ACT NA SUSP: 1.0000 | Freq: Every day | NASAL | 2 refills | Status: AC | PRN

## 2022-05-28 MED ORDER — DOXYCYCLINE HYCLATE 100 MG PO TABS: 100.0000 mg | ORAL_TABLET | Freq: Two times a day (BID) | ORAL | 0 refills | Status: DC

## 2022-05-28 NOTE — Progress Notes (Signed)
Acute Office Visit  Subjective:     Patient ID: Valerie Fisher, female    DOB: [birth year], 71 y.o.   MRN: [id number]  Chief Complaint  Patient presents with   Sore Throat    Pt states was having teeth cleaned yesterday . Dentist noticed that her throat was very red and recommended that she see her PCP  for this to be evaluated    Cough    Pt states  has had cough x couple of months    HPI Patient is in today for sore throat and cough.  She says that she has had a mild cough that feels like it is in her throat for several months.  She thought maybe it was just a little drainage or allergies.  It is not severe and she does not feel like it is in her chest.  But for the last 6 weeks she has been experiencing bilateral frontal headaches, throat soreness, tenderness and pressure over her nasal bridge.  She did not think much of it until she went to the orthodontist yesterday and they mentioned that her throat looked really red and encouraged her to follow-up with her PCP.  He says sometimes it is a little painful to swallow.  Also wanted to mention that ever since she had her Prolia shot in August she is just felt achy particularly in her upper body and arms.  It has not gone away completely she still notices some stiffness even though she tries to stretch.  Also wanted to know if she could have a referral for consultation for possible treatment and surgery of excess skin and irregularity in the abdominal wall.  ROS      Objective:    BP 118/80   Pulse 87   Temp 98.9 F (37.2 C)   Ht 5' (1.524 m)   Wt 124 lb (56.2 kg)   SpO2 100%   BMI 24.22 kg/m    Physical Exam Vitals and nursing note reviewed.  Constitutional:      Appearance: She is well-developed.  HENT:     Head: Normocephalic and atraumatic.     Right Ear: External ear normal.     Left Ear: External ear normal.     Nose: Nose normal.  Eyes:     Conjunctiva/sclera: Conjunctivae normal.     Pupils: Pupils are  equal, round, and reactive to light.  Neck:     Thyroid: No thyromegaly.  Cardiovascular:     Rate and Rhythm: Normal rate and regular rhythm.     Heart sounds: Normal heart sounds.  Pulmonary:     Effort: Pulmonary effort is normal.     Breath sounds: Normal breath sounds. No wheezing.  Musculoskeletal:     Cervical back: Neck supple.  Lymphadenopathy:     Cervical: No cervical adenopathy.  Skin:    General: Skin is warm and dry.  Neurological:     Mental Status: She is alert and oriented to person, place, and time.  Psychiatric:        Behavior: Behavior normal.     No results found for any visits on 05/28/22.      Assessment & Plan:   Problem List Items Addressed This Visit   None Visit Diagnoses     Pharyngitis, unspecified etiology    -  Primary   Relevant Medications   doxycycline (VIBRA-TABS) 100 MG tablet   fluticasone (FLONASE) 50 MCG/ACT nasal spray   Other Relevant Orders   CBC with  Differential/Platelet   Acute cough       Relevant Medications   doxycycline (VIBRA-TABS) 100 MG tablet   fluticasone (FLONASE) 50 MCG/ACT nasal spray   Other Relevant Orders   CBC with Differential/Platelet   Excess skin of abdomen       Relevant Orders   Ambulatory referral to Plastic Surgery       Meds ordered this encounter  Medications   doxycycline (VIBRA-TABS) 100 MG tablet    Sig: Take 1 tablet (100 mg total) by mouth 2 (two) times daily.    Dispense:  14 tablet    Refill:  0   fluticasone (FLONASE) 50 MCG/ACT nasal spray    Sig: Place 1-2 sprays into both nostrils daily as needed for allergies or rhinitis.    Dispense:  16 g    Refill:  2    No follow-ups on file.  Beatrice Lecher, MD

## 2022-05-29 LAB — CBC WITH DIFFERENTIAL/PLATELET
Absolute Monocytes: 428 cells/uL (ref 200–950)
Basophils Absolute: 32 cells/uL (ref 0–200)
Basophils Relative: 0.5 %
Eosinophils Absolute: 410 cells/uL (ref 15–500)
Eosinophils Relative: 6.5 %
HCT: 44 % (ref 35.0–45.0)
Hemoglobin: 15.4 g/dL (ref 11.7–15.5)
Lymphs Abs: 2003 cells/uL (ref 850–3900)
MCH: 30.5 pg (ref 27.0–33.0)
MCHC: 35 g/dL (ref 32.0–36.0)
MCV: 87.1 fL (ref 80.0–100.0)
MPV: 10 fL (ref 7.5–12.5)
Monocytes Relative: 6.8 %
Neutro Abs: 3427 cells/uL (ref 1500–7800)
Neutrophils Relative %: 54.4 %
Platelets: 333 10*3/uL (ref 140–400)
RBC: 5.05 10*6/uL (ref 3.80–5.10)
RDW: 12.6 % (ref 11.0–15.0)
Total Lymphocyte: 31.8 %
WBC: 6.3 10*3/uL (ref 3.8–10.8)

## 2022-05-29 NOTE — Progress Notes (Signed)
Your lab work is within acceptable range and there are no concerning findings.   ?

## 2022-06-02 ENCOUNTER — Ambulatory Visit: Payer: Medicare Other | Admitting: Plastic Surgery

## 2022-06-02 ENCOUNTER — Encounter: Payer: Self-pay | Admitting: Plastic Surgery

## 2022-06-02 VITALS — BP 118/63 | HR 73 | Ht 59.0 in | Wt 126.6 lb

## 2022-06-02 DIAGNOSIS — M793 Panniculitis, unspecified: Secondary | ICD-10-CM | POA: Diagnosis not present

## 2022-06-02 DIAGNOSIS — R21 Rash and other nonspecific skin eruption: Secondary | ICD-10-CM | POA: Diagnosis not present

## 2022-06-02 DIAGNOSIS — M542 Cervicalgia: Secondary | ICD-10-CM | POA: Diagnosis not present

## 2022-06-02 DIAGNOSIS — Z8719 Personal history of other diseases of the digestive system: Secondary | ICD-10-CM

## 2022-06-02 DIAGNOSIS — M549 Dorsalgia, unspecified: Secondary | ICD-10-CM | POA: Diagnosis not present

## 2022-06-02 DIAGNOSIS — F411 Generalized anxiety disorder: Secondary | ICD-10-CM

## 2022-06-02 DIAGNOSIS — G40309 Generalized idiopathic epilepsy and epileptic syndromes, not intractable, without status epilepticus: Secondary | ICD-10-CM

## 2022-06-02 DIAGNOSIS — Z853 Personal history of malignant neoplasm of breast: Secondary | ICD-10-CM

## 2022-06-02 DIAGNOSIS — K432 Incisional hernia without obstruction or gangrene: Secondary | ICD-10-CM

## 2022-06-02 NOTE — Addendum Note (Signed)
Addended by: Wallace Going on: 06/02/2022 12:35 PM   Modules accepted: Orders

## 2022-06-02 NOTE — Progress Notes (Addendum)
Patient ID: Valerie Fisher, female    DOB: [birth year], 70 y.o.   MRN: [id number]   Chief Complaint  Patient presents with   consult   Skin Problem    The patient is a 71 year old female here with her husband for evaluation of her abdomen.  She is 4 feet 11 inches tall and weighs 126 pounds.  She is unhappy with her abdomen.  It has extreme asymmetry and some breakdown due to the overhanging skin.  She lost 30 pounds in the last 3 years.  She is also had several abdominal surgeries.  The way she describes that she had diverticulitis which ended up with a resection and a colostomy.  She then had it reversed about a year later.  She then had a hernia repair of the incision site last year by Dr. Ninfa Linden.  I am sure that this is contributing to some of her asymmetry.  The original surgery was done by Randolm Idol in [place].  She is also had breast cancer and has had surgical intervention for that as well.  She has a seizure disorder.  She is not on any blood thinner and is not a smoker.    Review of Systems  Constitutional:  Positive for activity change. Negative for appetite change.  Eyes: Negative.   Respiratory: Negative.  Negative for chest tightness.   Cardiovascular: Negative.   Gastrointestinal: Negative.   Genitourinary: Negative.   Musculoskeletal:  Positive for back pain and neck pain.  Skin:  Positive for rash.  Hematological: Negative.   Psychiatric/Behavioral: Negative.      Past Medical History:  Diagnosis Date   Cancer [facility])    Breast Cancer 2010 right   Fatty liver    GERD (gastroesophageal reflux disease)    History of diverticular abscess of colon 12/12/2017   PONV (postoperative nausea and vomiting)    also slow to wake up   Seizures ([place])    last grand mal seizure was 14 years ago (as of 01/05/21)    Past Surgical History:  Procedure Laterality Date   bladder lift     COLOSTOMY     COLOSTOMY REVERSAL  02/2019   INCISIONAL HERNIA REPAIR N/A 01/07/2021    Procedure: LAPAROSCOPIC INCISIONAL HERNIA WITH MESH;  Surgeon: [name], MD;  Location: [place];  Service: General;  Laterality: N/A;      Current Outpatient Medications:    aspirin-acetaminophen-caffeine (EXCEDRIN MIGRAINE) 250-250-65 MG tablet, Take 1 tablet by mouth every 6 (six) hours as needed for headache., Disp: , Rfl:    Calcium Carbonate-Vit D-Min (CALCIUM 1200 PO), Take 1 tablet by mouth daily., Disp: , Rfl:    denosumab (PROLIA) 60 MG/ML SOSY injection, Inject 60 mg into the skin every 6 (six) months., Disp: , Rfl:    doxycycline (VIBRA-TABS) 100 MG tablet, Take 1 tablet (100 mg total) by mouth 2 (two) times daily., Disp: 14 tablet, Rfl: 0   fluticasone (FLONASE) 50 MCG/ACT nasal spray, Place 1-2 sprays into both nostrils daily as needed for allergies or rhinitis., Disp: 16 g, Rfl: 2   levETIRAcetam (KEPPRA) 500 MG tablet, Take 1,000 mg by mouth See admin instructions. Take 1000 mg in the morning and 1000 mg in the evening, Disp: , Rfl: 3   pantoprazole (PROTONIX) 40 MG tablet, TAKE ONE TABLET BY MOUTH TWICE DAILY BEFORE A MEAL, Disp: 60 tablet, Rfl: 0   Vitamin D, Ergocalciferol, (DRISDOL) 1.25 MG (50000 UNIT) CAPS capsule, Take 1 capsule (50,000 Units total) by  mouth every 7 (seven) days., Disp: 12 capsule, Rfl: 1   zonisamide (ZONEGRAN) 100 MG capsule, Take 200 mg by mouth at bedtime. 200 mg daily, Disp: , Rfl: 1  Current Facility-Administered Medications:    0.9 %  sodium chloride infusion, 500 mL, Intravenous, Continuous, Cirigliano, Vito V, DO   Objective:   Vitals:   06/02/22 0912  BP: 118/63  Pulse: 73  SpO2: 98%    Physical Exam Vitals and nursing note reviewed.  Constitutional:      Appearance: Normal appearance.  HENT:     Head: Normocephalic and atraumatic.  Cardiovascular:     Rate and Rhythm: Normal rate.     Pulses: Normal pulses.  Pulmonary:     Effort: Pulmonary effort is normal. No respiratory distress.  Abdominal:     General: There is no  distension.     Palpations: Abdomen is soft.  Musculoskeletal:        General: No swelling or deformity.  Skin:    General: Skin is warm.     Capillary Refill: Capillary refill takes less than 2 seconds.  Neurological:     Mental Status: She is alert and oriented to person, place, and time.  Psychiatric:        Mood and Affect: Mood normal.        Behavior: Behavior normal.        Thought Content: Thought content normal.        Judgment: Judgment normal.     Assessment & Plan:  History of right breast cancer  Incisional hernia, without obstruction or gangrene  Generalized nonconvulsive epilepsy ([place])  GAD (generalized anxiety disorder)  Panniculitis  Patient is a candidate for a panniculectomy.  I discussed with Dr. Ninfa Linden and CT with contrast recommended.  We will plan on a virtual visit next week.  Pictures were obtained of the patient and placed in the chart with the patient's or guardian's permission.   Sierra Brooks, DO

## 2022-06-08 ENCOUNTER — Encounter: Payer: Self-pay | Admitting: Plastic Surgery

## 2022-06-08 ENCOUNTER — Other Ambulatory Visit: Payer: Self-pay

## 2022-06-08 ENCOUNTER — Ambulatory Visit (INDEPENDENT_AMBULATORY_CARE_PROVIDER_SITE_OTHER): Payer: Medicare Other | Admitting: Plastic Surgery

## 2022-06-08 ENCOUNTER — Telehealth: Payer: Self-pay | Admitting: Plastic Surgery

## 2022-06-08 DIAGNOSIS — M793 Panniculitis, unspecified: Secondary | ICD-10-CM

## 2022-06-08 DIAGNOSIS — Z9049 Acquired absence of other specified parts of digestive tract: Secondary | ICD-10-CM

## 2022-06-08 NOTE — Telephone Encounter (Signed)
Pt called checking on her CT scan appt that Dr. Marla Roe told her we would schedule for her on her televist appt this morning.  She would like a call back to confirm if we will call her with the scheduled time or would the imaging facility call her.  I explained that insurance Josem Kaufmann has to be obtained for this procedure but someone would call her back to explain.

## 2022-06-08 NOTE — Progress Notes (Signed)
   Subjective:    Patient ID: Valerie Fisher, female    DOB: 08-21-1950, 71 y.o.   MRN: [id number]  The patient is a 71 year old female joining me by phone for further discussion about her panniculitis.  She has had surgery by Dr. Ninfa Linden for an incisional hernia.  I talked to him about what was going on and he recommended doing another CT.  I have the order in and we will check to be sure that is being preauthorized.  Today I discussed this with the patient.  She is in agreement.  We will plan to talk to her after the CT scan is done.      Review of Systems  Constitutional: Negative.   Eyes: Negative.   Respiratory: Negative.    Cardiovascular: Negative.   Gastrointestinal: Negative.   Endocrine: Negative.   Genitourinary: Negative.   Musculoskeletal: Negative.        Objective:   Physical Exam        Assessment & Plan:     ICD-10-CM   1. H/O colectomy  Z90.49     2. Panniculitis  M79.3        While waiting for the CT scan I encouraged the patient to continue with healthy eating in order to prepare herself for the possibility of surgery.  We will talk after the CT scan is completed.  It is with contrast.  I connected with  Delara Elbert on 06/08/22 by phone and verified that I am speaking with the correct person using two identifiers. We spent 10 minutes in discussion.   I discussed the limitations of evaluation and management by telemedicine. The patient expressed understanding and agreed to proceed.

## 2022-06-08 NOTE — Telephone Encounter (Signed)
No Authorization Req [id number]. Patient is aware of appointment at 1:00 10/25 at [facility].

## 2022-06-09 ENCOUNTER — Other Ambulatory Visit: Payer: Medicare Other

## 2022-06-14 ENCOUNTER — Ambulatory Visit (INDEPENDENT_AMBULATORY_CARE_PROVIDER_SITE_OTHER): Payer: Medicare Other

## 2022-06-14 DIAGNOSIS — F411 Generalized anxiety disorder: Secondary | ICD-10-CM

## 2022-06-14 DIAGNOSIS — K432 Incisional hernia without obstruction or gangrene: Secondary | ICD-10-CM | POA: Diagnosis not present

## 2022-06-14 DIAGNOSIS — G40309 Generalized idiopathic epilepsy and epileptic syndromes, not intractable, without status epilepticus: Secondary | ICD-10-CM | POA: Diagnosis not present

## 2022-06-14 DIAGNOSIS — M793 Panniculitis, unspecified: Secondary | ICD-10-CM

## 2022-06-14 LAB — I-STAT CREATININE (MANUAL ENTRY): Creatinine, Ser: 0.8 (ref 0.50–1.10)

## 2022-06-14 MED ORDER — IOHEXOL 300 MG/ML  SOLN
100.0000 mL | Freq: Once | INTRAMUSCULAR | Status: AC | PRN
  Administered 2022-06-14: 100 mL via INTRAVENOUS

## 2022-07-08 ENCOUNTER — Other Ambulatory Visit: Payer: Self-pay | Admitting: Family Medicine

## 2022-07-08 DIAGNOSIS — E559 Vitamin D deficiency, unspecified: Secondary | ICD-10-CM

## 2022-07-16 ENCOUNTER — Telehealth: Payer: Self-pay | Admitting: *Deleted

## 2022-07-16 ENCOUNTER — Ambulatory Visit (INDEPENDENT_AMBULATORY_CARE_PROVIDER_SITE_OTHER): Payer: Medicare Other | Admitting: Family Medicine

## 2022-07-16 VITALS — BP 129/77 | HR 68 | Temp 97.9°F | Ht 59.0 in | Wt 127.0 lb

## 2022-07-16 DIAGNOSIS — J0100 Acute maxillary sinusitis, unspecified: Secondary | ICD-10-CM | POA: Diagnosis not present

## 2022-07-16 MED ORDER — AZITHROMYCIN 250 MG PO TABS: ORAL_TABLET | ORAL | 0 refills | Status: AC

## 2022-07-16 NOTE — Telephone Encounter (Signed)
Currently [facility] shows term date of 08/15/22. Unable to submit auth yet for 2024 but that is the next available OR time with [facility]. Will continue to check for 2024 benefits via portals

## 2022-07-16 NOTE — Progress Notes (Signed)
Patient evaluated and physical performed by me personally.  Agree with note below.

## 2022-07-16 NOTE — Progress Notes (Signed)
   Acute Office Visit  Subjective:     Patient ID: Javaeh Muscatello, female    DOB: Aug 30, 1950, 71 y.o.   MRN: [id number]     Sore Throat  Associated symptoms include congestion, coughing, ear pain and headaches. Pertinent negatives include no shortness of breath.    Patient is a 71 year old female presenting to clinic with sore throat, cough, and right ear pain that started Monday. On Monday, she woke up and it was hard to swallow and had a runny nose. Her right ear pain and cough started shortly after. No mucus production. She now has developed a headache and tooth pain on the right side. She has been using her Flonase which has helped. Denies any recent sick contacts. Denies fever, chest pain, shortness of breath. She said she had similar symptoms in October in which she was treated with Doxycycline and Flonase which helped. She would not like to use Doxycycline again due to constipation.  Review of Systems  Constitutional:  Negative for chills and fever.  HENT:  Positive for congestion, ear pain, hearing loss, sinus pain and sore throat.   Eyes:  Negative for pain.  Respiratory:  Positive for cough. Negative for sputum production, shortness of breath and wheezing.   Cardiovascular:  Negative for chest pain and palpitations.  Neurological:  Positive for headaches.  All other systems reviewed and are negative.       Objective:    BP 129/77   Pulse 68   Temp 97.9 F (36.6 C) (Oral)   Ht '4\' 11"'$  (1.499 m)   Wt 57.6 kg   SpO2 100%   BMI 25.65 kg/m    Physical Exam Constitutional:      Appearance: Normal appearance.  HENT:     Right Ear: Tympanic membrane normal.     Left Ear: Tympanic membrane normal.     Nose: Rhinorrhea present.     Mouth/Throat:     Pharynx: Oropharynx is clear.  Cardiovascular:     Rate and Rhythm: Normal rate and regular rhythm.  Pulmonary:     Effort: Pulmonary effort is normal.     Breath sounds: Normal breath sounds.  Musculoskeletal:      Cervical back: Tenderness present.  Lymphadenopathy:     Cervical: Cervical adenopathy present.  Neurological:     Mental Status: She is alert.    No results found for any visits on 07/16/22.      Assessment & Plan:   Problem List Items Addressed This Visit   None Visit Diagnoses     Acute non-recurrent maxillary sinusitis    -  Primary   Relevant Medications   azithromycin (ZITHROMAX) 250 MG tablet       Meds ordered this encounter  Medications  . azithromycin (ZITHROMAX) 250 MG tablet    Sig: 2 Ttabs PO on Day 1, then one a day x 4 days.    Dispense:  6 tablet    Refill:  0    Likely bacterial sinusitis due to unilateral symptoms.  Start azithromycin. Continue Flonase and symptomatic relief as needed. Drink plenty of fluids and rest. If symptoms recur again, may need to discuss potential ENT referral for further evaluation. Follow up as needed if symptoms worsen or persist.   No follow-ups on file.  Dorian Heckle, Student-PA

## 2022-07-27 ENCOUNTER — Ambulatory Visit: Payer: Medicare Other | Admitting: Family Medicine

## 2022-08-22 ENCOUNTER — Telehealth: Payer: Self-pay | Admitting: *Deleted

## 2022-08-22 NOTE — Telephone Encounter (Signed)
Auth pending for CPT [id number] - [id number]

## 2022-09-09 ENCOUNTER — Telehealth: Payer: Self-pay | Admitting: *Deleted

## 2022-09-09 ENCOUNTER — Ambulatory Visit (INDEPENDENT_AMBULATORY_CARE_PROVIDER_SITE_OTHER): Payer: Medicare Other | Admitting: Pharmacist

## 2022-09-09 DIAGNOSIS — F411 Generalized anxiety disorder: Secondary | ICD-10-CM

## 2022-09-09 DIAGNOSIS — M810 Age-related osteoporosis without current pathological fracture: Secondary | ICD-10-CM

## 2022-09-09 NOTE — Telephone Encounter (Signed)
Notified patient of denial. She would like [place] to try for an appeal.

## 2022-09-09 NOTE — Progress Notes (Signed)
Chronic Care Management Pharmacy Note  09/09/2022 Name:  Valerie Fisher MRN:  [id number] DOB:  Jan 23, 1951  Summary: addressed mental health, osteoporosis, epilepsy. Updated medication list.  Recommendations/Changes made from today's visit: none, patient is doing well  Plan: f/u with pharmacist as needed  Subjective: Valerie Fisher is an 72 y.o. year old female who is a primary patient of Metheney, Rene Kocher, MD.  The CCM team was consulted for assistance with disease management and care coordination needs.    Engaged with patient by telephone for follow up visit in response to provider referral for pharmacy case management and/or care coordination services.   Consent to Services:  The patient was given information about Chronic Care Management services, agreed to services, and gave verbal consent prior to initiation of services.  Please see initial visit note for detailed documentation.   Patient Care Team: Hali Marry, MD as PCP - General (Family Medicine) Darius Bump, [facility] as Pharmacist (Pharmacist)   Objective:  Lab Results  Component Value Date   CREATININE 0.80 06/14/2022   CREATININE 0.82 04/06/2022   CREATININE 0.71 10/07/2021       Component Value Date/Time   CHOL 246 (H) 09/12/2019 1321   TRIG 138 09/12/2019 1321   HDL 88 09/12/2019 1321   CHOLHDL 2.8 09/12/2019 1321   CHOLHDL 2.9 09/07/2019 1434   LDLCALC 134 (H) 09/12/2019 1321   LDLCALC 123 (H) 09/07/2019 1434   [place] CANCELED 09/07/2019 1434       Latest Ref Rng & Units 04/06/2022    4:20 PM 10/07/2021    2:06 PM 03/27/2021   11:58 AM  Hepatic Function  Total Protein 6.0 - 8.5 g/dL 6.8  6.6  7.1   Albumin 3.8 - 4.8 g/dL 4.4  4.7  4.9   AST 0 - 40 IU/L '19  17  15   '$ ALT 0 - 32 IU/L '11  13  9   '$ Alk Phosphatase 44 - 121 IU/L 55  55  86   Total Bilirubin 0.0 - 1.2 mg/dL 0.4  0.2  0.4     Lab Results  Component Value Date/Time   TSH 1.52 07/20/2021 02:40 PM   TSH 1.650 09/12/2019  01:21 PM   FREET4 0.71 07/20/2021 02:40 PM       Latest Ref Rng & Units 05/28/2022   12:00 AM 01/08/2021    1:29 AM 01/07/2021    7:15 AM  CBC  WBC 3.8 - 10.8 Thousand/uL 6.3  7.7  5.2   Hemoglobin 11.7 - 15.5 g/dL 15.4  12.4  14.7   Hematocrit 35.0 - 45.0 % 44.0  37.9  45.3   Platelets 140 - 400 Thousand/uL 333  260  302     Social History   Tobacco Use  Smoking Status Never  Smokeless Tobacco Never   BP Readings from Last 3 Encounters:  07/16/22 129/77  06/02/22 118/63  05/28/22 118/80   Pulse Readings from Last 3 Encounters:  07/16/22 68  06/02/22 73  05/28/22 87   Wt Readings from Last 3 Encounters:  07/16/22 127 lb (57.6 kg)  06/02/22 126 lb 9.6 oz (57.4 kg)  05/28/22 124 lb (56.2 kg)    Assessment: Review of patient past medical history, allergies, medications, health status, including review of consultants reports, laboratory and other test data, was performed as part of comprehensive evaluation and provision of chronic care management services.   SDOH:  (Social Determinants of Health) assessments and interventions performed:  SDOH Interventions  [place] Office Visit from 11/10/2020 in [place] at [place] Interventions   Food Insecurity Interventions Intervention Not Indicated  Housing Interventions Intervention Not Indicated  Transportation Interventions Intervention Not Indicated  Financial Strain Interventions Intervention Not Indicated  Physical Activity Interventions Intervention Not Indicated  Stress Interventions Intervention Not Indicated  Social Connections Interventions Intervention Not Indicated       CCM Care Plan  Allergies  Allergen Reactions   Aspirin Other (See Comments)    GI upset    Hydrocodone-Acetaminophen Nausea And Vomiting   Hydromorphone Hcl Nausea And Vomiting   Morphine Nausea And Vomiting   Pseudoephedrine Hcl Nausea And Vomiting   Other Nausea Only     General anesthesia    Zofran [Ondansetron] Other (See Comments)    Headache    Medications Reviewed Today     Reviewed by Annamaria Helling, CMA (Certified Medical Assistant) on 07/16/22 at 1332  Med List Status: <None>   Medication Order Taking? Sig Documenting Provider Last Dose Status Informant  0.9 %  sodium chloride infusion [id number]   Cirigliano, Dominic Pea, DO  Active   aspirin-acetaminophen-caffeine (EXCEDRIN MIGRAINE) [phone] MG tablet [id number] Yes Take 1 tablet by mouth every 6 (six) hours as needed for headache. [provider] Taking Active Self  Calcium Carbonate-Vit D-Min (CALCIUM 1200 PO) [id number] Yes Take 1 tablet by mouth daily. [provider] Taking Active   denosumab (PROLIA) 60 MG/ML SOSY injection [id number] Yes Inject 60 mg into the skin every 6 (six) months. [provider] Taking Active Self           Med Note Darra Lis Feb 17, 2021  1:53 PM) Due August 2022  fluticasone [facility]) 50 MCG/ACT nasal spray [id number] Yes Place 1-2 sprays into both nostrils daily as needed for allergies or rhinitis. Hali Marry, MD Taking Active   levETIRAcetam (KEPPRA) 500 MG tablet [id number] Yes Take 1,000 mg by mouth See admin instructions. Take 1000 mg in the morning and 1000 mg in the evening Sam, Aline August, MD Taking Active Self           Med Note Izora Gala   Tue Jan 19, 2022  1:47 PM) Neurologist   pantoprazole (PROTONIX) 40 MG tablet [id number] Yes TAKE ONE TABLET BY MOUTH TWICE DAILY BEFORE A MEAL Cirigliano, Vito V, DO Taking Active   Vitamin D, Ergocalciferol, 50000 units CAPS [id number] Yes Take 1 capsule (50,000 Units total) by mouth every 7 (seven) days. Hali Marry, MD Taking Active   zonisamide [facility]) 100 MG capsule [id number] Yes Take 200 mg by mouth at bedtime. 200 mg daily Merlene Morse, MD Taking Active Self            Patient Active Problem List   Diagnosis Date Noted   Panniculitis 06/02/2022    Vitamin D deficiency 01/19/2022   Compression fracture of thoracic vertebra with routine healing 04/14/2021   Incisional hernia 01/07/2021   Fatty liver 10/07/2020   Calculus of gallbladder without cholecystitis without obstruction 10/07/2020   Insomnia 05/07/2019   Right leg pain 05/07/2019   GAD (generalized anxiety disorder) 03/05/2019   History of right breast cancer 03/05/2019   H/O colectomy 02/15/2019   DDD (degenerative disc disease) 01/18/2019   History of diverticular abscess of colon 12/12/2017   Auditory complaints of both ears 04/04/2016   Osteoporosis 03/28/2015   Therapeutic drug monitoring 01/24/2014   Absence epilepsy, not intractable,  without status epilepticus ([place]) 05/15/2013   Generalized nonconvulsive epilepsy ([place]) 05/15/2013   Rotator cuff tendonitis 04/23/2013   Internal hemorrhoids 03/30/2013    Immunization History  Administered Date(s) Administered   COVID-19, mRNA, vaccine(Comirnaty)12 years and older 06/11/2022   Fluad Quad(high Dose 65+) 05/02/2019, 05/30/2020, 04/14/2021   Influenza, High Dose Seasonal PF 06/03/2018   Influenza, Seasonal, Injecte, Preservative Fre 05/17/2015   Influenza-Unspecified 05/11/2022   PFIZER(Purple Top)SARS-COV-2 Vaccination 09/16/2019, 10/14/2019, 11/07/2019, 06/14/2021   Pneumococcal Conjugate-13 08/26/2014   Pneumococcal Polysaccharide-23 09/08/2015   Tdap 07/26/2008, 08/04/2018   Zoster Recombinat (Shingrix) 07/14/2021, 10/14/2021   Zoster, Live 08/15/2014    Conditions to be addressed/monitored: Anxiety, osteoporosis, epilepsy  There are no care plans that you recently modified to display for this patient.       Medication Assistance: None required.  Patient affirms current coverage meets needs.  Patient's preferred pharmacy is:  [place], [place] - [place] [address] Phone: [phone] Fax: [phone]  [facility] ([place], [facility] [address] [place] [address] [place] [address] Phone: [phone] Fax: [phone]   Uses pill box? Yes Pt endorses 100% compliance  Follow Up:  Patient agrees to Care Plan and Follow-up.  Plan: The patient has been provided with contact information for the care management team and has been advised to call with any health related questions or concerns.   Larinda Buttery, PharmD Clinical Pharmacist [facility] [phone]

## 2022-09-15 ENCOUNTER — Telehealth: Payer: Self-pay

## 2022-09-15 NOTE — Telephone Encounter (Signed)
Called patient, advised her authorization for panniculectomy was denied due to no proof of treatment for 3 months for skin irritation and treatment failed with no improvement.  She indicated having no rashes at this time, but has ongoing itching. Suggested for her to make an appointment with her PC and discuss with them how the area under the fold of her abdomen has been itching for quite some time and needs medication.  This could be due to having fungus, dry skin, or something else. Try for 3 months, go back to PCP and discuss her problem again if the medication has not helped.  We will need documents of her office visits in regards to having itching skin.  This will help [place] build a better case so that we can resubmit to her insurance company for an appeal.  Patient understood and agreed with plan.

## 2022-10-04 ENCOUNTER — Other Ambulatory Visit: Payer: Self-pay | Admitting: Gastroenterology

## 2022-10-04 DIAGNOSIS — K2970 Gastritis, unspecified, without bleeding: Secondary | ICD-10-CM

## 2022-10-08 ENCOUNTER — Other Ambulatory Visit: Payer: Self-pay | Admitting: *Deleted

## 2022-10-08 DIAGNOSIS — M810 Age-related osteoporosis without current pathological fracture: Secondary | ICD-10-CM

## 2022-10-09 ENCOUNTER — Other Ambulatory Visit: Payer: Self-pay | Admitting: Family Medicine

## 2022-10-09 DIAGNOSIS — K2970 Gastritis, unspecified, without bleeding: Secondary | ICD-10-CM

## 2022-10-13 ENCOUNTER — Telehealth: Payer: Self-pay | Admitting: Family Medicine

## 2022-10-13 ENCOUNTER — Telehealth: Payer: Self-pay

## 2022-10-13 ENCOUNTER — Ambulatory Visit: Payer: Medicare Other

## 2022-10-13 LAB — COMPLETE METABOLIC PANEL WITH GFR
AG Ratio: 1.7 (calc) (ref 1.0–2.5)
ALT: 11 U/L (ref 6–29)
AST: 15 U/L (ref 10–35)
Albumin: 4.4 g/dL (ref 3.6–5.1)
Alkaline phosphatase (APISO): 52 U/L (ref 37–153)
BUN: 24 mg/dL (ref 7–25)
CO2: 25 mmol/L (ref 20–32)
Calcium: 9.5 mg/dL (ref 8.6–10.4)
Chloride: 105 mmol/L (ref 98–110)
Creat: 0.69 mg/dL (ref 0.60–1.00)
Globulin: 2.6 g/dL (calc) (ref 1.9–3.7)
Glucose, Bld: 82 mg/dL (ref 65–99)
Potassium: 4.6 mmol/L (ref 3.5–5.3)
Sodium: 139 mmol/L (ref 135–146)
Total Bilirubin: 0.7 mg/dL (ref 0.2–1.2)
Total Protein: 7 g/dL (ref 6.1–8.1)
eGFR: 92 mL/min/{1.73_m2} (ref >=60)

## 2022-10-13 NOTE — Telephone Encounter (Signed)
Kenney Houseman can you check with her and see if she is wanting a new dermatology referral or if she is talking about a medication that was denied I am just not sure.  I am happy to place a new referral if she would like.

## 2022-10-13 NOTE — Telephone Encounter (Signed)
Contacted Valerie Fisher to schedule their annual wellness visit. Appointment made for 10/29/22 at [facility] Direct Dial: [phone]

## 2022-10-13 NOTE — Progress Notes (Signed)
Your lab work is within acceptable range and there are no concerning findings.   ?

## 2022-10-13 NOTE — Telephone Encounter (Signed)
Patient states she is having some itching related to a referral from two years ago (insurance denied her treatment at that location), states that over-the counter medication is not working well. States she spoke to PCP CMA yesterday but wanted to send a message to PCP. Please advise.

## 2022-10-13 NOTE — Telephone Encounter (Signed)
Please start a PA for Prolia. Labs done and patient scheduled.

## 2022-10-14 NOTE — Telephone Encounter (Signed)
Valerie Fisher wanted to know if there is documentation for what over the counter cream Dr Madilyn Fireman recommended the the itching around the incision site. I looked through her chart and could not find documentation for itching around incision site. She wanted to know if Dr Madilyn Fireman could prescribe something for the itching. She has an up coming appointment at the end of March.

## 2022-10-15 MED ORDER — TRIAMCINOLONE ACETONIDE 0.1 % EX CREA: 1.0000 | TOPICAL_CREAM | Freq: Two times a day (BID) | CUTANEOUS | 0 refills | Status: DC | PRN

## 2022-10-15 NOTE — Telephone Encounter (Signed)
Left message advising of recommendations.  

## 2022-10-15 NOTE — Telephone Encounter (Signed)
Meds ordered this encounter  Medications   triamcinolone cream (KENALOG) 0.1 %    Sig: Apply 1 Application topically 2 (two) times daily as needed.    Dispense:  30 g    Refill:  0

## 2022-10-19 ENCOUNTER — Telehealth: Payer: Self-pay

## 2022-10-19 NOTE — Telephone Encounter (Signed)
Initiated Prior authorization [id number] '60MG'$ /ML syringes Via: Covermymeds Case/Key:[id number] Status: denied as of 10/19/22 Reason:Unfortunately, Prolia Inj '60mg'$ /Ml is not covered by your plan benefits. Your Medicare Advantage (MA) plan's drug coverage is limited to only the drugs covered under your Part B medical benefit. This plan does not include Part D prescription drug coverage. Please refer to your Evidence of Coverage (EOC) for more information. If you have a separate commercial pharmacy benefit or Medicare Part D benefit managed by another plan, please contact them directly for information about how to get your prescription drug benefit. Notified Pt via: Mychart

## 2022-10-20 ENCOUNTER — Ambulatory Visit: Payer: Medicare Other

## 2022-10-20 NOTE — Telephone Encounter (Signed)
Started PA through [facility]. They state the approval may take up to 5 days.  Ref # [id number]

## 2022-10-21 ENCOUNTER — Ambulatory Visit (INDEPENDENT_AMBULATORY_CARE_PROVIDER_SITE_OTHER): Payer: Medicare Other | Admitting: Family Medicine

## 2022-10-21 VITALS — BP 103/73 | HR 77 | Ht 59.0 in | Wt 129.0 lb

## 2022-10-21 DIAGNOSIS — M810 Age-related osteoporosis without current pathological fracture: Secondary | ICD-10-CM | POA: Diagnosis not present

## 2022-10-21 MED ORDER — DENOSUMAB 60 MG/ML ~~LOC~~ SOSY
60.0000 mg | PREFILLED_SYRINGE | Freq: Once | SUBCUTANEOUS | Status: AC
  Administered 2022-10-21: 60 mg via SUBCUTANEOUS

## 2022-10-21 NOTE — Progress Notes (Signed)
Agree with documentation as above.   Vonya Ohalloran, MD  

## 2022-10-21 NOTE — Telephone Encounter (Signed)
See other telephone message 

## 2022-10-21 NOTE — Telephone Encounter (Addendum)
The Prolia authorization was approved. Patient scheduled.   Approval # [id number]  10/20/22 - 10/20/23

## 2022-10-21 NOTE — Progress Notes (Signed)
Patient is here for a Prolia injection. Patient reports taking calcium and vitamin D daily. Patient's calcium levels and kidney function was within normal limits.   Pt tolerated injection well without complications. Pt advised to schedule next injection in 6 months.   Location: [facility]

## 2022-10-29 ENCOUNTER — Ambulatory Visit (INDEPENDENT_AMBULATORY_CARE_PROVIDER_SITE_OTHER): Payer: Medicare Other | Admitting: Family Medicine

## 2022-10-29 DIAGNOSIS — Z0000 Encounter for general adult medical examination without abnormal findings: Secondary | ICD-10-CM

## 2022-10-29 NOTE — Progress Notes (Signed)
MEDICARE ANNUAL WELLNESS VISIT  10/29/2022  Telephone Visit Disclaimer This Medicare AWV was conducted by telephone due to national recommendations for restrictions regarding the COVID-19 Pandemic (e.g. social distancing).  I verified, using two identifiers, that I am speaking with Valerie Fisher or their authorized healthcare agent. I discussed the limitations, risks, security, and privacy concerns of performing an evaluation and management service by telephone and the potential availability of an in-person appointment in the future. The patient expressed understanding and agreed to proceed.  Location of Patient: Home Location of Provider (nurse):  Provider home  Subjective:    Valerie Fisher is a 72 y.o. female patient of Metheney, Rene Kocher, MD who had a Medicare Annual Wellness Visit today via telephone. Adalay is Retired and lives with their spouse. she has 3 children. she reports that she is socially active and does interact with friends/family regularly. she is moderately physically active and enjoys cooking and exercising.  Patient Care Team: Hali Marry, MD as PCP - General (Family Medicine) Darius Bump, [facility] as Pharmacist (Pharmacist)     10/29/2022    3:03 PM 01/07/2021    7:19 AM 11/10/2020    2:31 PM 03/05/2019    9:31 AM  Advanced Directives  Does Patient Have a Medical Advance Directive? Yes No Yes No  Type of Advance Directive Living will  Grant;Living will   Does patient want to make changes to medical advance directive? No - Patient declined  No - Patient declined   Copy of [place] in Chart?   No - copy requested   Would patient like information on creating a medical advance directive?  No - Patient declined  Yes (MAU/Ambulatory/Procedural Areas - Information given)    Hospital Utilization Over the Past 12 Months: # of hospitalizations or ER visits: 0 # of surgeries: 0  Review of Systems    Patient  reports that her overall health is better compared to last year.  History obtained from chart review and the patient  Patient Reported Readings (BP, Pulse, CBG, Weight, etc) none  Pain Assessment Pain : No/denies pain     Current Medications & Allergies (verified) Allergies as of 10/29/2022       Reactions   Hydrocodone-acetaminophen Nausea And Vomiting   Hydromorphone Hcl Nausea And Vomiting   Morphine Nausea And Vomiting   Pseudoephedrine Hcl Nausea And Vomiting   Other Nausea Only   General anesthesia    Zofran [ondansetron] Other (See Comments)   Headache        Medication List        Accurate as of October 29, 2022  3:15 PM. If you have any questions, ask your nurse or doctor.          aspirin-acetaminophen-caffeine 250-250-65 MG tablet Commonly known as: EXCEDRIN MIGRAINE Take 1 tablet by mouth every 6 (six) hours as needed for headache.   CALCIUM 1200 PO Take 1 tablet by mouth daily.   fluticasone 50 MCG/ACT nasal spray Commonly known as: FLONASE Place 1-2 sprays into both nostrils daily as needed for allergies or rhinitis.   levETIRAcetam 500 MG tablet Commonly known as: KEPPRA Take 1,000 mg by mouth See admin instructions. Take 1000 mg in the morning and 1000 mg in the evening   pantoprazole 40 MG tablet Commonly known as: PROTONIX TAKE ONE TABLET BY MOUTH TWICE DAILY BEFORE A MEAL   Prolia 60 MG/ML Sosy injection Generic drug: denosumab Inject 60 mg into the skin  every 6 (six) months.   triamcinolone cream 0.1 % Commonly known as: KENALOG Apply 1 Application topically 2 (two) times daily as needed.   Vitamin D (Ergocalciferol) 50000 units Caps Take 1 capsule (50,000 Units total) by mouth every 7 (seven) days.   zonisamide 100 MG capsule Commonly known as: ZONEGRAN Take 200 mg by mouth at bedtime. 200 mg daily        History (reviewed): Past Medical History:  Diagnosis Date   Arthritis 2000   Not sure date mild   Cancer ([place])     Breast Cancer 2010 right   Cataract 2022   No need for surgery, not effecting sight   Fatty liver    GERD (gastroesophageal reflux disease)    History of diverticular abscess of colon 12/12/2017   PONV (postoperative nausea and vomiting)    also slow to wake up   Seizures ([place])    last grand mal seizure was 14 years ago (as of 01/05/21)   Past Surgical History:  Procedure Laterality Date   bladder lift     BREAST SURGERY  2010   COLOSTOMY     COLOSTOMY REVERSAL  02/2019   INCISIONAL HERNIA REPAIR N/A 01/07/2021   Procedure: LAPAROSCOPIC INCISIONAL HERNIA WITH MESH;  Surgeon: [name], MD;  Location: [facility];  Service: General;  Laterality: N/A;   SMALL INTESTINE SURGERY  2019 April   Family History  Problem Relation Age of Onset   Diabetes Father    Heart attack Father        mid 83s   Heart disease Father    Varicose Veins Father    Colon cancer Neg Hx    Pancreatic cancer Neg Hx    Stomach cancer Neg Hx    Esophageal cancer Neg Hx    Liver cancer Neg Hx    Social History   Socioeconomic History   Marital status: Married    Spouse name: BJ   Number of children: 3   Years of education: 20   Highest education level: Master's degree (e.g., MA, MS, MEng, MEd, MSW, MBA)  Occupational History   Occupation: retired Pharmacist, hospital  Tobacco Use   Smoking status: Never   Smokeless tobacco: Never  Vaping Use   Vaping Use: Never used  Substance and Sexual Activity   Alcohol use: Not Currently   Drug use: Never   Sexual activity: Yes    Partners: Male    Birth control/protection: None  Other Topics Concern   Not on file  Social History Narrative   Lives with her husband. They have three children. They have one dog. In her free time, she enjoys cooking and exercising.    Social Determinants of Health   Financial Resource Strain: Low Risk  (10/28/2022)   Overall Financial Resource Strain (CARDIA)    Difficulty of Paying Living Expenses: Not hard at all  Food  Insecurity: No Food Insecurity (10/28/2022)   Hunger Vital Sign    Worried About Running Out of Food in the Last Year: Never true    Ran Out of Food in the Last Year: Never true  Transportation Needs: No Transportation Needs (10/28/2022)   PRAPARE - Hydrologist (Medical): No    Lack of Transportation (Non-Medical): No  Physical Activity: Sufficiently Active (10/28/2022)   Exercise Vital Sign    Days of Exercise per Week: 5 days    Minutes of Exercise per Session: 30 min  Stress: No Stress Concern Present (10/28/2022)  [place] Questionnaire    Feeling of Stress : Only a little  Social Connections: Moderately Integrated (10/29/2022)   Social Connection and Isolation Panel [NHANES]    Frequency of Communication with Friends and Family: More than three times a week    Frequency of Social Gatherings with Friends and Family: Twice a week    Attends Religious Services: Never    Marine scientist or Organizations: Yes    Attends Music therapist: More than 4 times per year    Marital Status: Married    Activities of Daily Living    10/28/2022    7:22 PM  In your present state of health, do you have any difficulty performing the following activities:  Hearing? 0  Vision? 0  Difficulty concentrating or making decisions? 0  Walking or climbing stairs? 0  Dressing or bathing? 0  Doing errands, shopping? 0  Preparing Food and eating ? N  Using the Toilet? N  In the past six months, have you accidently leaked urine? N  Do you have problems with loss of bowel control? N  Managing your Medications? N  Managing your Finances? N  Housekeeping or managing your Housekeeping? N    Patient Education/ Literacy How often do you need to have someone help you when you read instructions, pamphlets, or other written materials from your doctor or pharmacy?: 1 - Never What is the last grade level you  completed in school?: post masters  Exercise Current Exercise Habits: Home exercise routine, Type of exercise: treadmill, Time (Minutes): 30, Frequency (Times/Week): 5, Weekly Exercise (Minutes/Week): 150, Intensity: Moderate, Exercise limited by: None identified  Diet Patient reports consuming 2 meals a day and 1 snack(s) a day Patient reports that her primary diet is: Regular Patient reports that she does have regular access to food.   Depression Screen    10/29/2022    3:03 PM 07/16/2022    1:32 PM 05/28/2022    2:59 PM 01/19/2022    2:02 PM 03/16/2021    3:02 PM 11/10/2020    2:27 PM 09/11/2019   10:16 AM  PHQ 2/9 Scores  PHQ - 2 Score 0 0 0 0 0 0 0  PHQ- 9 Score    2        Fall Risk    10/29/2022    3:03 PM 10/28/2022    7:22 PM 10/21/2022    2:11 PM 07/16/2022    1:32 PM 05/28/2022    2:57 PM  [place] in the past year? 0 0 0 0 0  Number falls in past yr: 0  0 0 0  Injury with Fall? 0  0 0 0  Risk for fall due to : No Fall Risks  No Fall Risks No Fall Risks No Fall Risks  Follow up Falls evaluation completed  Falls evaluation completed Falls evaluation completed Falls evaluation completed     Objective:  Trechelle Salatino seemed alert and oriented and she participated appropriately during our telephone visit.  Blood Pressure Weight BMI  BP Readings from Last 3 Encounters:  10/21/22 103/73  07/16/22 129/77  06/02/22 118/63   Wt Readings from Last 3 Encounters:  10/21/22 129 lb (58.5 kg)  07/16/22 127 lb (57.6 kg)  06/02/22 126 lb 9.6 oz (57.4 kg)   BMI Readings from Last 1 Encounters:  10/21/22 26.05 kg/m    *Unable to obtain current vital signs, weight, and BMI due to  telephone visit type  Hearing/Vision  Damyria did not seem to have difficulty with hearing/understanding during the telephone conversation Reports that she has had a formal eye exam by an eye care professional within the past year Reports that she has not had a formal hearing evaluation  within the past year *Unable to fully assess hearing and vision during telephone visit type  Cognitive Function:    10/29/2022    3:07 PM 11/10/2020    2:32 PM  6CIT Screen  What Year? 0 points 0 points  What month? 0 points 0 points  What time? 0 points 0 points  Count back from 20 2 points 2 points  Months in reverse 0 points 0 points  Repeat phrase 0 points 2 points  Total Score 2 points 4 points   (Normal:0-7, Significant for Dysfunction: >8)  Normal Cognitive Function Screening: Yes   Immunization & Health Maintenance Record Immunization History  Administered Date(s) Administered   COVID-19, mRNA, vaccine(Comirnaty)12 years and older 06/11/2022   Fluad Quad(high Dose 65+) 05/02/2019, 05/30/2020, 04/14/2021   Influenza, High Dose Seasonal PF 06/03/2018   Influenza, Seasonal, Injecte, Preservative Fre 05/17/2015   Influenza-Unspecified 05/11/2022   PFIZER(Purple Top)SARS-COV-2 Vaccination 09/16/2019, 10/14/2019, 11/07/2019, 06/14/2021   Pneumococcal Conjugate-13 08/26/2014   Pneumococcal Polysaccharide-23 09/08/2015   Tdap 07/26/2008, 08/04/2018   Zoster Recombinat (Shingrix) 07/14/2021, 10/14/2021   Zoster, Live 08/15/2014    Health Maintenance  Topic Date Due   COVID-19 Vaccine (6 - 2023-24 season) 04/17/2023 (Originally 08/06/2022)   Hepatitis C Screening  10/29/2023 (Originally 11/04/1968)   MAMMOGRAM  12/20/2022   Medicare Annual Wellness (AWV)  10/29/2023   DTaP/Tdap/Td (3 - Td or Tdap) 08/04/2028   Pneumonia Vaccine 69+ Years old  Completed   INFLUENZA VACCINE  Completed   DEXA SCAN  Completed   Zoster Vaccines- Shingrix  Completed   HPV VACCINES  Aged Out   COLONOSCOPY (Pts 45-48yrs Insurance coverage will need to be confirmed)  Discontinued       Assessment  This is a routine wellness examination for [employer].  Health Maintenance: Due or Overdue There are no preventive care reminders to display for this patient.   Calene Sagen does not  need a referral for [employer] Assistance: Care Management:   no Social Work:    no Prescription Assistance:  no Nutrition/Diabetes Education:  no   Plan:  Personalized Goals  Goals Addressed               This Visit's Progress     Patient Stated (pt-stated)        Patient stated that she would like to have a better exercise routine.       Personalized Health Maintenance & Screening Recommendations  Screening mammography  Lung Cancer Screening Recommended: no (Low Dose CT Chest recommended if Age 51-80 years, 30 pack-year currently smoking OR have quit w/in past 15 years) Hepatitis C Screening recommended: no HIV Screening recommended: no  Advanced Directives: Written information was not prepared per patient's request.  Referrals & Orders No orders of the defined types were placed in this encounter.   Follow-up Plan Follow-up with Hali Marry, MD as planned Schedule mammogram with [facility] once you confirm that it has been a year since your last one.  Medicare wellness visit in one year.  Patient will access AVS on my chart.   I have personally reviewed and noted the following in the patient's chart:   Medical and social history Use of alcohol, tobacco or illicit  drugs  Current medications and supplements Functional ability and status Nutritional status Physical activity Advanced directives List of other physicians Hospitalizations, surgeries, and ER visits in previous 12 months Vitals Screenings to include cognitive, depression, and falls Referrals and appointments  In addition, I have reviewed and discussed with Valerie Paganini Kath certain preventive protocols, quality metrics, and best practice recommendations. A written personalized care plan for preventive services as well as general preventive health recommendations is available and can be mailed to the patient at her request.      Tinnie Gens, RN BSN  10/29/2022

## 2022-10-29 NOTE — Patient Instructions (Addendum)
[facility] Maintenance Summary and Written Plan of Care  Ms. Holston ,  Thank you for allowing me to perform your Medicare Annual Wellness Visit and for your ongoing commitment to your health.   Health Maintenance & Immunization History Health Maintenance  Topic Date Due   COVID-19 Vaccine (6 - 2023-24 season) 04/17/2023 (Originally 08/06/2022)   Hepatitis C Screening  10/29/2023 (Originally 11/04/1968)   MAMMOGRAM  12/20/2022   Medicare Annual Wellness (AWV)  10/29/2023   DTaP/Tdap/Td (3 - Td or Tdap) 08/04/2028   Pneumonia Vaccine 41+ Years old  Completed   INFLUENZA VACCINE  Completed   DEXA SCAN  Completed   Zoster Vaccines- Shingrix  Completed   HPV VACCINES  Aged Out   COLONOSCOPY (Pts 45-22yrs Insurance coverage will need to be confirmed)  Discontinued   Immunization History  Administered Date(s) Administered   COVID-19, mRNA, vaccine(Comirnaty)12 years and older 06/11/2022   Fluad Quad(high Dose 65+) 05/02/2019, 05/30/2020, 04/14/2021   Influenza, High Dose Seasonal PF 06/03/2018   Influenza, Seasonal, Injecte, Preservative Fre 05/17/2015   Influenza-Unspecified 05/11/2022   PFIZER(Purple Top)SARS-COV-2 Vaccination 09/16/2019, 10/14/2019, 11/07/2019, 06/14/2021   Pneumococcal Conjugate-13 08/26/2014   Pneumococcal Polysaccharide-23 09/08/2015   Tdap 07/26/2008, 08/04/2018   Zoster Recombinat (Shingrix) 07/14/2021, 10/14/2021   Zoster, Live 08/15/2014    These are the patient goals that we discussed:  Goals Addressed               This Visit's Progress     Patient Stated (pt-stated)        Patient stated that she would like to have a better exercise routine.         This is a list of Health Maintenance Items that are overdue or due now:  Screening mammography  Orders/Referrals Placed Today: No orders of the defined types were placed in this encounter.  (Contact our referral department at [phone] if you have not spoken  with someone about your referral appointment within the next 5 days)    Follow-up Plan Follow-up with Hali Marry, MD as planned Schedule mammogram with [facility] once you confirm that it has been a year since your last one.  Medicare wellness visit in one year.  Patient will access AVS on my chart.      Health Maintenance, Female Adopting a healthy lifestyle and getting preventive care are important in promoting health and wellness. Ask your health care provider about: The right schedule for you to have regular tests and exams. Things you can do on your own to prevent diseases and keep yourself healthy. What should I know about diet, weight, and exercise? Eat a healthy diet  Eat a diet that includes plenty of vegetables, fruits, low-fat dairy products, and lean protein. Do not eat a lot of foods that are high in solid fats, added sugars, or sodium. Maintain a healthy weight Body mass index (BMI) is used to identify weight problems. It estimates body fat based on height and weight. Your health care provider can help determine your BMI and help you achieve or maintain a healthy weight. Get regular exercise Get regular exercise. This is one of the most important things you can do for your health. Most adults should: Exercise for at least 150 minutes each week. The exercise should increase your heart rate and make you sweat (moderate-intensity exercise). Do strengthening exercises at least twice a week. This is in addition to the moderate-intensity exercise. Spend less time sitting. Even light physical activity can be  beneficial. Watch cholesterol and blood lipids Have your blood tested for lipids and cholesterol at 74 years of age, then have this test every 5 years. Have your cholesterol levels checked more often if: Your lipid or cholesterol levels are high. You are older than 74 years of age. You are at high risk for heart disease. What should I know about cancer  screening? Depending on your health history and family history, you may need to have cancer screening at various ages. This may include screening for: Breast cancer. Cervical cancer. Colorectal cancer. Skin cancer. Lung cancer. What should I know about heart disease, diabetes, and high blood pressure? Blood pressure and heart disease High blood pressure causes heart disease and increases the risk of stroke. This is more likely to develop in people who have high blood pressure readings or are overweight. Have your blood pressure checked: Every 3-5 years if you are 81-62 years of age. Every year if you are 67 years old or older. Diabetes Have regular diabetes screenings. This checks your fasting blood sugar level. Have the screening done: Once every three years after age 2 if you are at a normal weight and have a low risk for diabetes. More often and at a younger age if you are overweight or have a high risk for diabetes. What should I know about preventing infection? Hepatitis B If you have a higher risk for hepatitis B, you should be screened for this virus. Talk with your health care provider to find out if you are at risk for hepatitis B infection. Hepatitis C Testing is recommended for: Everyone born from [birth year] through 1967. Anyone with known risk factors for hepatitis C. Sexually transmitted infections (STIs) Get screened for STIs, including gonorrhea and chlamydia, if: You are sexually active and are younger than 74 years of age. You are older than 74 years of age and your health care provider tells you that you are at risk for this type of infection. Your sexual activity has changed since you were last screened, and you are at increased risk for chlamydia or gonorrhea. Ask your health care provider if you are at risk. Ask your health care provider about whether you are at high risk for HIV. Your health care provider may recommend a prescription medicine to help prevent HIV  infection. If you choose to take medicine to prevent HIV, you should first get tested for HIV. You should then be tested every 3 months for as long as you are taking the medicine. Pregnancy If you are about to stop having your period (premenopausal) and you may become pregnant, seek counseling before you get pregnant. Take 400 to 800 micrograms (mcg) of folic acid every day if you become pregnant. Ask for birth control (contraception) if you want to prevent pregnancy. Osteoporosis and menopause Osteoporosis is a disease in which the bones lose minerals and strength with aging. This can result in bone fractures. If you are 46 years old or older, or if you are at risk for osteoporosis and fractures, ask your health care provider if you should: Be screened for bone loss. Take a calcium or vitamin D supplement to lower your risk of fractures. Be given hormone replacement therapy (HRT) to treat symptoms of menopause. Follow these instructions at home: Alcohol use Do not drink alcohol if: Your health care provider tells you not to drink. You are pregnant, may be pregnant, or are planning to become pregnant. If you drink alcohol: Limit how much you have to: 0-1 drink  a day. Know how much alcohol is in your drink. In the [place] one drink equals one 12 oz bottle of beer (355 mL), one 5 oz glass of wine (148 mL), or one 1 oz glass of hard liquor (44 mL). Lifestyle Do not use any products that contain nicotine or tobacco. These products include cigarettes, chewing tobacco, and vaping devices, such as e-cigarettes. If you need help quitting, ask your health care provider. Do not use street drugs. Do not share needles. Ask your health care provider for help if you need support or information about quitting drugs. General instructions Schedule regular health, dental, and eye exams. Stay current with your vaccines. Tell your health care provider if: You often feel depressed. You have ever been abused  or do not feel safe at home. Summary Adopting a healthy lifestyle and getting preventive care are important in promoting health and wellness. Follow your health care provider's instructions about healthy diet, exercising, and getting tested or screened for diseases. Follow your health care provider's instructions on monitoring your cholesterol and blood pressure. This information is not intended to replace advice given to you by your health care provider. Make sure you discuss any questions you have with your health care provider. Document Revised: 12/22/2020 Document Reviewed: 12/22/2020 Elsevier Patient Education  [place]

## 2022-11-11 ENCOUNTER — Ambulatory Visit (INDEPENDENT_AMBULATORY_CARE_PROVIDER_SITE_OTHER): Payer: Medicare Other | Admitting: Family Medicine

## 2022-11-11 ENCOUNTER — Encounter: Payer: Self-pay | Admitting: Family Medicine

## 2022-11-11 VITALS — BP 113/71 | HR 78 | Ht 59.0 in | Wt 126.0 lb

## 2022-11-11 DIAGNOSIS — Z0000 Encounter for general adult medical examination without abnormal findings: Secondary | ICD-10-CM | POA: Diagnosis not present

## 2022-11-11 DIAGNOSIS — Z1231 Encounter for screening mammogram for malignant neoplasm of breast: Secondary | ICD-10-CM

## 2022-11-11 DIAGNOSIS — Z13220 Encounter for screening for lipoid disorders: Secondary | ICD-10-CM

## 2022-11-11 NOTE — Patient Instructions (Signed)
Recommended trial of Benadryl 25 mg at bedtime for sleep.  If that is not helping you can also try valerian root capsules.  Just follow the instructions on the bottle.

## 2022-11-11 NOTE — Progress Notes (Signed)
Complete physical exam  Patient: Valerie Fisher   DOB: 12/24/1950   72 y.o. Female  MRN: [id number]  Subjective:    Chief Complaint  Patient presents with   Annual Exam    Valerie Fisher is a 72 y.o. female who presents today for a complete physical exam. She reports consuming a general diet.  Some walking, but not as much over the winter  She generally feels well. She reports sleeping poorly. She does not have additional problems to discuss today.    Most recent fall risk assessment:    11/11/2022   10:38 AM  Fall Risk   Falls in the past year? 0  Number falls in past yr: 0  Injury with Fall? 0  Risk for fall due to : No Fall Risks  Follow up Falls evaluation completed     Most recent depression screenings:    11/11/2022   10:38 AM 10/29/2022    3:03 PM  PHQ 2/9 Scores  PHQ - 2 Score 0 0        Patient Care Team: Hali Marry, MD as PCP - General (Family Medicine) Darius Bump, [facility] as Pharmacist (Pharmacist)   Outpatient Medications Prior to Visit  Medication Sig   aspirin-acetaminophen-caffeine (EXCEDRIN MIGRAINE) [phone] MG tablet Take 1 tablet by mouth every 6 (six) hours as needed for headache.   Calcium Carbonate-Vit D-Min (CALCIUM 1200 PO) Take 1 tablet by mouth daily.   denosumab (PROLIA) 60 MG/ML SOSY injection Inject 60 mg into the skin every 6 (six) months.   fluticasone (FLONASE) 50 MCG/ACT nasal spray Place 1-2 sprays into both nostrils daily as needed for allergies or rhinitis.   levETIRAcetam (KEPPRA) 500 MG tablet Take 1,000 mg by mouth See admin instructions. Take 1000 mg in the morning and 1000 mg in the evening   pantoprazole (PROTONIX) 40 MG tablet TAKE ONE TABLET BY MOUTH TWICE DAILY BEFORE A MEAL   triamcinolone cream (KENALOG) 0.1 % Apply 1 Application topically 2 (two) times daily as needed.   Vitamin D, Ergocalciferol, 50000 units CAPS Take 1 capsule (50,000 Units total) by mouth every 7 (seven) days.   zonisamide (ZONEGRAN)  100 MG capsule Take 200 mg by mouth at bedtime. 200 mg daily   [DISCONTINUED] 0.9 %  sodium chloride infusion    No facility-administered medications prior to visit.    ROS        Objective:     BP 113/71   Pulse 78   Ht 4\' 11"  (1.499 m)   Wt 126 lb (57.2 kg)   SpO2 97%   BMI 25.45 kg/m    Physical Exam Vitals and nursing note reviewed.  Constitutional:      Appearance: She is well-developed.  HENT:     Head: Normocephalic and atraumatic.     Right Ear: Tympanic membrane, ear canal and external ear normal.     Left Ear: Tympanic membrane, ear canal and external ear normal.     Nose: Nose normal.     Mouth/Throat:     Pharynx: Oropharynx is clear.  Eyes:     Conjunctiva/sclera: Conjunctivae normal.     Pupils: Pupils are equal, round, and reactive to light.  Neck:     Thyroid: No thyromegaly.  Cardiovascular:     Rate and Rhythm: Normal rate and regular rhythm.     Heart sounds: Normal heart sounds.  Pulmonary:     Effort: Pulmonary effort is normal.     Breath sounds: Normal breath  sounds. No wheezing.  Abdominal:     General: Bowel sounds are normal.     Palpations: Abdomen is soft.  Musculoskeletal:     Cervical back: Neck supple.  Lymphadenopathy:     Cervical: No cervical adenopathy.  Skin:    General: Skin is warm and dry.     Coloration: Skin is not pale.  Neurological:     Mental Status: She is alert and oriented to person, place, and time.  Psychiatric:        Mood and Affect: Mood normal.        Behavior: Behavior normal.      No results found for any visits on 11/11/22.     Assessment & Plan:    Routine Health Maintenance and Physical Exam  Immunization History  Administered Date(s) Administered   COVID-19, mRNA, vaccine(Comirnaty)12 years and older 06/11/2022   Fluad Quad(high Dose 65+) 05/02/2019, 05/30/2020, 04/14/2021   Influenza, High Dose Seasonal PF 06/03/2018   Influenza, Seasonal, Injecte, Preservative Fre 05/17/2015    Influenza-Unspecified 05/11/2022   PFIZER(Purple Top)SARS-COV-2 Vaccination 09/16/2019, 10/14/2019, 11/07/2019, 06/14/2021   Pneumococcal Conjugate-13 08/26/2014   Pneumococcal Polysaccharide-23 09/08/2015   Tdap 07/26/2008, 08/04/2018   Zoster Recombinat (Shingrix) 07/14/2021, 10/14/2021   Zoster, Live 08/15/2014    Health Maintenance  Topic Date Due   COVID-19 Vaccine (6 - 2023-24 season) 04/17/2023 (Originally 08/06/2022)   Hepatitis C Screening  10/29/2023 (Originally 11/04/1968)   MAMMOGRAM  12/20/2022   Medicare Annual Wellness (AWV)  10/29/2023   DTaP/Tdap/Td (3 - Td or Tdap) 08/04/2028   Pneumonia Vaccine 43+ Years old  Completed   INFLUENZA VACCINE  Completed   DEXA SCAN  Completed   Zoster Vaccines- Shingrix  Completed   HPV VACCINES  Aged Out   COLONOSCOPY (Pts 45-44yrs Insurance coverage will need to be confirmed)  Discontinued    Discussed health benefits of physical activity, and encouraged her to engage in regular exercise appropriate for her age and condition.  Problem List Items Addressed This Visit   None Visit Diagnoses     Wellness examination    -  Primary   Screening mammogram for breast cancer       Relevant Orders   MM 3D SCREENING MAMMOGRAM BILATERAL BREAST   Screening, lipid       Relevant Orders   Lipid Panel w/reflex Direct LDL     Keep up a regular exercise program and make sure you are eating a healthy diet Try to eat 4 servings of dairy a day, or if you are lactose intolerant take a calcium with vitamin D daily.  Your vaccines are up to date.   She did mention that her upper outer arms and her shoulders tend to hurt at night and in fact she cannot sleep on them.  She has full range of motion of her shoulders so given handout for bursitis if not improving over the next 3 weeks encouraged her to schedule with our sports medicine doctor.  Return in about 1 year (around 11/11/2023) for Wellness Exam.     Valerie Lecher, MD

## 2022-11-12 LAB — LIPID PANEL W/REFLEX DIRECT LDL
Cholesterol: 261 mg/dL — ABNORMAL HIGH (ref <200)
HDL: 106 mg/dL (ref >=50)
LDL Cholesterol (Calc): 132 mg/dL (calc) — ABNORMAL HIGH
Non-HDL Cholesterol (Calc): 155 mg/dL (calc) — ABNORMAL HIGH (ref <130)
Total CHOL/HDL Ratio: 2.5 (calc) (ref <5.0)
Triglycerides: 123 mg/dL (ref <150)

## 2022-11-15 NOTE — Progress Notes (Signed)
Hi Valerie Fisher, LDL cholesterol is up slightly from 3 years ago when it was 123 it is now 132.  Just encouraged her to continue to work on healthy diet and regular exercise to bring that down.  You have absolutely fantastic HDL at 106 which is phenomenal.

## 2022-12-08 DIAGNOSIS — E7800 Pure hypercholesterolemia, unspecified: Secondary | ICD-10-CM | POA: Diagnosis not present

## 2022-12-24 ENCOUNTER — Other Ambulatory Visit: Payer: Self-pay | Admitting: Family Medicine

## 2022-12-24 DIAGNOSIS — E559 Vitamin D deficiency, unspecified: Secondary | ICD-10-CM

## 2023-02-18 DIAGNOSIS — Z1231 Encounter for screening mammogram for malignant neoplasm of breast: Secondary | ICD-10-CM | POA: Diagnosis not present

## 2023-02-18 DIAGNOSIS — R92323 Mammographic fibroglandular density, bilateral breasts: Secondary | ICD-10-CM | POA: Diagnosis not present

## 2023-02-18 LAB — HM MAMMOGRAPHY

## 2023-02-25 ENCOUNTER — Encounter: Payer: Self-pay | Admitting: Family Medicine

## 2023-03-04 ENCOUNTER — Ambulatory Visit (INDEPENDENT_AMBULATORY_CARE_PROVIDER_SITE_OTHER): Payer: Medicare Other | Admitting: Medical-Surgical

## 2023-03-04 ENCOUNTER — Encounter: Payer: Self-pay | Admitting: Medical-Surgical

## 2023-03-04 VITALS — BP 109/74 | HR 79 | Resp 20 | Ht 59.0 in | Wt 129.1 lb

## 2023-03-04 DIAGNOSIS — L049 Acute lymphadenitis, unspecified: Secondary | ICD-10-CM

## 2023-03-04 MED ORDER — AMOXICILLIN-POT CLAVULANATE 875-125 MG PO TABS: 1.0000 | ORAL_TABLET | Freq: Two times a day (BID) | ORAL | 0 refills | Status: DC

## 2023-03-04 NOTE — Progress Notes (Signed)
        Established patient visit  History, exam, impression, and plan:  Very pleasant 72 year old female presenting today for evaluation of left arm pain that started about 3 days ago.  Notes that she was on her treadmill and holding onto the bar while walking when she developed a shooting/sharp pain in the antecubital area.  At that time, it spread down the forearm as well as all the way up into the axillary region.  Reports that it feels like nerve pain or soft tissue etiology.  Has some swelling in the left antecubital area however no erythema.  No recent injuries and no previous surgical procedures.  Now reports that the arm is aching all the way down to her fingers.  She took Tylenol last night at bedtime which helped her pain a bit.  On exam, upper extremity muscle strength 5 of 5 bilaterally.  Radial pulses 2+.  Capillary refill brisk.  Skin warm, dry, intact.  Left medial antecubital edema noted, tender to touch.  No fluctuance and poorly defined borders.  No firm nodules.  Denies recent injury, infection, and illness.  No fevers, chills, or other myalgias.  Unclear etiology.  Suspect swelling and discomfort related to lymph node enlargement and pressure on nerves.  Discussed ultrasound evaluation but she would like to hold off on this.  Plan to treat for lymphadenitis with Augmentin twice daily x 7 days.  If no improvement with antibiotic therapy, we will proceed with ultrasound.  Okay to use Tylenol/ibuprofen for pain as needed.  Recommend heat/ice, compression, elevation, etc.  Procedures performed this visit: None.  Return if symptoms worsen or fail to improve.  __________________________________ Thayer Ohm, DNP, APRN, FNP-BC Primary Care and Sports Medicine [facility]

## 2023-04-01 ENCOUNTER — Other Ambulatory Visit: Payer: Self-pay

## 2023-04-01 DIAGNOSIS — M810 Age-related osteoporosis without current pathological fracture: Secondary | ICD-10-CM

## 2023-04-01 NOTE — Progress Notes (Signed)
Ordered labs for Prolia injection.  

## 2023-04-11 ENCOUNTER — Encounter: Payer: Self-pay | Admitting: Family Medicine

## 2023-04-11 ENCOUNTER — Ambulatory Visit (INDEPENDENT_AMBULATORY_CARE_PROVIDER_SITE_OTHER): Payer: Medicare Other | Admitting: Family Medicine

## 2023-04-11 VITALS — Temp 98.0°F

## 2023-04-11 DIAGNOSIS — Z23 Encounter for immunization: Secondary | ICD-10-CM | POA: Diagnosis not present

## 2023-04-11 NOTE — Progress Notes (Signed)
Pt here for flu shot. Afebrile,no recent illness. Vaccination given, pt tolerated well.  

## 2023-04-20 ENCOUNTER — Other Ambulatory Visit: Payer: Self-pay | Admitting: Family Medicine

## 2023-04-20 DIAGNOSIS — K2970 Gastritis, unspecified, without bleeding: Secondary | ICD-10-CM

## 2023-04-21 ENCOUNTER — Other Ambulatory Visit: Payer: Medicare Other

## 2023-04-21 DIAGNOSIS — M810 Age-related osteoporosis without current pathological fracture: Secondary | ICD-10-CM | POA: Diagnosis not present

## 2023-04-22 ENCOUNTER — Telehealth: Payer: Self-pay

## 2023-04-22 ENCOUNTER — Telehealth: Payer: Self-pay | Admitting: Family Medicine

## 2023-04-22 LAB — CMP14+EGFR
ALT: 10 IU/L (ref 0–32)
AST: 17 IU/L (ref 0–40)
Albumin: 4.4 g/dL (ref 3.8–4.8)
Alkaline Phosphatase: 58 IU/L (ref 44–121)
BUN/Creatinine Ratio: 16 (ref 12–28)
BUN: 14 mg/dL (ref 8–27)
Bilirubin Total: 0.5 mg/dL (ref 0.0–1.2)
CO2: 23 mmol/L (ref 20–29)
Calcium: 9.8 mg/dL (ref 8.7–10.3)
Chloride: 105 mmol/L (ref 96–106)
Creatinine, Ser: 0.88 mg/dL (ref 0.57–1.00)
Globulin, Total: 2.3 g/dL (ref 1.5–4.5)
Glucose: 102 mg/dL — ABNORMAL HIGH (ref 70–99)
Potassium: 4.3 mmol/L (ref 3.5–5.2)
Sodium: 142 mmol/L (ref 134–144)
Total Protein: 6.7 g/dL (ref 6.0–8.5)
eGFR: 69 mL/min/{1.73_m2} (ref >59)

## 2023-04-22 NOTE — Progress Notes (Signed)
Your lab work is within acceptable range and there are no concerning findings.   ?

## 2023-04-22 NOTE — Telephone Encounter (Signed)
Patient dropped off document  Health Examination Certificate  , to be filled out by provider. Patient requested to send it back via Call Patient to pick up within 5-days. Document is located in providers box.Please advise at Mobile [phone] (mobile)

## 2023-04-25 ENCOUNTER — Ambulatory Visit: Payer: Medicare Other

## 2023-04-25 NOTE — Telephone Encounter (Signed)
Pt called. Requesting to put hold on form because she is not sure she is going to take the job.

## 2023-04-29 NOTE — Telephone Encounter (Signed)
Pt called to follow up on PA for prolia

## 2023-05-02 ENCOUNTER — Telehealth: Payer: Self-pay

## 2023-05-02 ENCOUNTER — Ambulatory Visit: Payer: Medicare Other

## 2023-05-02 VITALS — BP 110/71 | HR 76 | Resp 18 | Ht 59.0 in | Wt 132.1 lb

## 2023-05-02 DIAGNOSIS — M810 Age-related osteoporosis without current pathological fracture: Secondary | ICD-10-CM

## 2023-05-02 MED ORDER — DENOSUMAB 60 MG/ML ~~LOC~~ SOSY
60.0000 mg | PREFILLED_SYRINGE | Freq: Once | SUBCUTANEOUS | Status: AC
Start: 2023-05-02 — End: 2023-05-02
  Administered 2023-05-02: 60 mg via SUBCUTANEOUS

## 2023-05-02 NOTE — Telephone Encounter (Signed)
A prior authorization for Prolia.  Approved - [id number] 05/02/2023 - 05/01/2024

## 2023-05-02 NOTE — Progress Notes (Unsigned)
Established Patient Office Visit  Subjective   Patient ID: Valerie Fisher, female    DOB: [birth year]  Age: 72 y.o. MRN: [id number]  Prolia Injection   HPI  Prolia Injection. Patient is taking calcium and vitamin D.   ROS    Objective:     BP 110/71   Pulse 76   Resp 18   Ht 4\' 11"  (1.499 m)   Wt 132 lb 1.9 oz (59.9 kg)   SpO2 92%   BMI 26.69 kg/m  {Vitals History (Optional):23777}  Physical Exam   No results found for any visits on 05/02/23.  {Labs (Optional):23779}  The ASCVD Risk score (Arnett DK, et al., 2019) failed to calculate for the following reasons:   The valid HDL cholesterol range is 20 to 100 mg/dL    Assessment & Plan:  Patient received Prolia Injection in R SQ. Patient tolerated it well.Patient advised to schedule next injection for 6 months. Patient verbalized understanding of the above.   {AMB GI UYQI:34742}   Problem List Items Addressed This Visit       Musculoskeletal and Integument   Osteoporosis - Primary    Return in about 26 weeks (around 10/31/2023) for Prolia Injection.    Aerika Groll  [place], CMA

## 2023-07-05 ENCOUNTER — Other Ambulatory Visit: Payer: Self-pay | Admitting: Family Medicine

## 2023-07-05 DIAGNOSIS — E559 Vitamin D deficiency, unspecified: Secondary | ICD-10-CM

## 2023-09-10 ENCOUNTER — Other Ambulatory Visit: Payer: Self-pay | Admitting: Family Medicine

## 2023-09-10 DIAGNOSIS — K2970 Gastritis, unspecified, without bleeding: Secondary | ICD-10-CM

## 2023-09-27 ENCOUNTER — Encounter: Payer: Self-pay | Admitting: Family Medicine

## 2023-09-27 ENCOUNTER — Ambulatory Visit (INDEPENDENT_AMBULATORY_CARE_PROVIDER_SITE_OTHER): Payer: Medicare Other | Admitting: Family Medicine

## 2023-09-27 VITALS — BP 130/78 | HR 88 | Ht 59.0 in | Wt 128.0 lb

## 2023-09-27 DIAGNOSIS — R1030 Lower abdominal pain, unspecified: Secondary | ICD-10-CM

## 2023-09-27 DIAGNOSIS — R051 Acute cough: Secondary | ICD-10-CM

## 2023-09-27 DIAGNOSIS — N9489 Other specified conditions associated with female genital organs and menstrual cycle: Secondary | ICD-10-CM | POA: Diagnosis not present

## 2023-09-27 LAB — POC COVID19 BINAXNOW: SARS Coronavirus 2 Ag: NEGATIVE

## 2023-09-27 MED ORDER — TRAMADOL HCL 50 MG PO TABS: 50.0000 mg | ORAL_TABLET | Freq: Three times a day (TID) | ORAL | 0 refills | Status: AC | PRN

## 2023-09-27 NOTE — Progress Notes (Signed)
Established Patient Office Visit  Subjective  Patient ID: Valerie Fisher, female    DOB: July 31, 1951  Age: 73 y.o. MRN: [id number]  Chief Complaint  Patient presents with   Follow-up    Abdominal pain     HPI  Today for hospital follow-up.  She was seen on initially on January 28 for abdominal pain.  CT of the abdomen showed the following findings.  IMPRESSION:  Motion limited exam.   1. Findings suggest inflammatory process in the right lower quadrant. There is edema around the cecal base, and a small amount of free fluid in the right adnexa. A blind-ending tubular structure is seen near the cecal base, containing small stones or other dense contents. This structure is suspicious for an abnormal appendix although unclear if source of inflammation. Acute appendicitis cannot be excluded.. There is also mild inflammation of the small bowel in the right lower quadrant suggesting enteritis.  2. Small liver and left renal cysts.  3. Colonic diverticulosis. Surgical changes at the rectosigmoid junction.  4. Chronic thoracolumbar compression fractures.   At the time her white blood cell count was slightly elevated around 11,000 and she was given a prescription for Augmentin for 7 days for possible appendicitis.  She ended up going back to the emergency room on February 8 at [facility] for abdominal pain, as she continued to have pain and discomfort.  She had completed her antibiotics.. Had repeat CT showed.    IMPRESSION:  3.2 x 2.5 cm cyst or abscess right adnexal/pelvic sidewall.   T10 moderate and L2 mild vertebral body compression fractures, new since 2019.   They did an additional ultrasound after the CT in the emergency room and CT revealed 2 cm right adnexal cyst.  They did not comment on the structure of the cyst.  I also reported that the right ovary was measuring 2.4 x 2.1 cm.  She still having pain across her lower abdomen and into her upper thighs.  Increased activity seems to  make it worse she just feels extremely tired.  No energy.  She has not noticed any blood in the stools or change with her bowels.  She has been using Tylenol for pain but says it is not helping that much.  She also broke her tooth in early January and has a dentist appointment this afternoon to address it.  She had to wait till her insurance changed.  She is also had a cough for about a week it started after she went to the ED the first time she says it is mostly a dry cough she has not had any fevers chills or sweats.    ROS    Objective:     BP 130/78   Pulse 88   Ht 4\' 11"  (1.499 m)   Wt 128 lb (58.1 kg)   SpO2 98%   BMI 25.85 kg/m    Physical Exam Vitals and nursing note reviewed.  Constitutional:      Appearance: Normal appearance.  HENT:     Head: Normocephalic and atraumatic.  Eyes:     Conjunctiva/sclera: Conjunctivae normal.  Cardiovascular:     Rate and Rhythm: Normal rate and regular rhythm.  Pulmonary:     Effort: Pulmonary effort is normal.     Breath sounds: Normal breath sounds.  Abdominal:     General: Bowel sounds are normal.     Palpations: Abdomen is soft.     Tenderness: There is abdominal tenderness.     Comments:  She has pain in the right side of her abdomen when I press on the left side.  She is most tender in the right lower quadrant.  Mildly tender in the left upper quadrant and suprapubic area.  Skin:    General: Skin is warm and dry.  Neurological:     Mental Status: She is alert.  Psychiatric:        Mood and Affect: Mood normal.      Results for orders placed or performed in visit on 09/27/23  POC COVID-19  Result Value Ref Range   SARS Coronavirus 2 Ag Negative Negative      The ASCVD Risk score (Arnett DK, et al., 2019) failed to calculate for the following reasons:   The valid HDL cholesterol range is 20 to 100 mg/dL    Assessment & Plan:   Problem List Items Addressed This Visit   None Visit Diagnoses       Adnexal  mass    -  Primary   Relevant Orders   CBC with Differential/Platelet   CA 125   Ambulatory referral to Obstetrics / Gynecology     Lower abdominal pain       Relevant Medications   traMADol (ULTRAM) 50 MG tablet     Acute cough       Relevant Orders   POC COVID-19 (Completed)       I am very concerned that she still having significant discomfort and pain with what appears to be a 2 cm right sided adnexal mass versus cyst.  Will try to get her in with OB/GYN this week we will try tramadol for pain relief.  Will recheck a CBC just to make sure that her white count is not climbing.  Cough she has had a dry cough for about a week-no other fever or other sore throat etc. or sinus congestion.  I did recommend we at least test for COVID before she goes for dental appointment this afternoon.  No follow-ups on file.    Nani Gasser, MD

## 2023-09-28 ENCOUNTER — Other Ambulatory Visit: Payer: Self-pay | Admitting: Family Medicine

## 2023-09-28 ENCOUNTER — Encounter: Payer: Self-pay | Admitting: Family Medicine

## 2023-09-28 LAB — CBC WITH DIFFERENTIAL/PLATELET
Basophils Absolute: 0.1 10*3/uL (ref 0.0–0.2)
Basos: 1 %
EOS (ABSOLUTE): 0.1 10*3/uL (ref 0.0–0.4)
Eos: 1 %
Hematocrit: 43.9 % (ref 34.0–46.6)
Hemoglobin: 14.3 g/dL (ref 11.1–15.9)
Immature Grans (Abs): 0.1 10*3/uL (ref 0.0–0.1)
Immature Granulocytes: 1 %
Lymphocytes Absolute: 1.1 10*3/uL (ref 0.7–3.1)
Lymphs: 10 %
MCH: 29.3 pg (ref 26.6–33.0)
MCHC: 32.6 g/dL (ref 31.5–35.7)
MCV: 90 fL (ref 79–97)
Monocytes Absolute: 0.8 10*3/uL (ref 0.1–0.9)
Monocytes: 7 %
Neutrophils Absolute: 8.9 10*3/uL — ABNORMAL HIGH (ref 1.4–7.0)
Neutrophils: 80 %
Platelets: 551 10*3/uL — ABNORMAL HIGH (ref 150–450)
RBC: 4.88 x10E6/uL (ref 3.77–5.28)
RDW: 13.1 % (ref 11.7–15.4)
WBC: 11.1 10*3/uL — ABNORMAL HIGH (ref 3.4–10.8)

## 2023-09-28 LAB — CA 125: Cancer Antigen (CA) 125: 66.2 U/mL — ABNORMAL HIGH (ref 0.0–38.1)

## 2023-09-28 MED ORDER — AMOXICILLIN-POT CLAVULANATE 875-125 MG PO TABS: 1.0000 | ORAL_TABLET | Freq: Two times a day (BID) | ORAL | 0 refills | Status: DC

## 2023-09-28 NOTE — Progress Notes (Signed)
Hi Valerie Fisher, the white count is still up just a little bit.  Your platelets are also little high this can occur with inflammation.  I did check an additional marker called a CA125 to see if there may be some hyperactivity in the tissue in your ovaries and it was a little elevated.  It looks like you have an appointment with Dr. Berton Lan next week so i'm going to forward this to her as well.

## 2023-09-29 ENCOUNTER — Telehealth: Payer: Self-pay

## 2023-09-29 NOTE — Telephone Encounter (Signed)
Copied from CRM [id number]. Topic: Clinical - Lab/Test Results >> Sep 29, 2023 11:21 AM Valerie Fisher wrote: Reason for CRM: Patient has additional questions regarding lab results. Patient requesting a call back.

## 2023-09-29 NOTE — Telephone Encounter (Unsigned)
Copied from CRM [id number]. Topic: Clinical - Lab/Test Results >> Sep 29, 2023 11:21 AM Valerie Fisher wrote: Reason for CRM: Patient has additional questions regarding lab results. Patient requesting a call back.

## 2023-09-30 ENCOUNTER — Telehealth: Payer: Self-pay

## 2023-09-30 NOTE — Telephone Encounter (Signed)
Copied from CRM [id number]. Topic: Clinical - Lab/Test Results >> Sep 29, 2023  4:36 PM [name] wrote: Reason for CRM: Patient called back in is awaiting call to go over lab results read note as written by provider but patient has further questions or concerns. Also She states provider she saw at Hospital, Dr Luan Pulling has set her up to see a different GYN so she will not be seeing Dr Berton Lan and needs to cancel that appt. Patient states new GYN is Dr. Verdis Frederickson off [address] in [place]. They sent the results of CT and Hospital visit to them. She also states she was sent home with AUGMENTIN from the Hospital is it ok for her to pick up new prescription and continue taking. Thank You

## 2023-09-30 NOTE — Telephone Encounter (Signed)
Spoke with patient. Reviewed lab results with her. She states that she had already been referred to DR. [place] with gynecology oncology  by the hospital and has an upcoming appt with him scheduled for 10/04/2023 @ 2pm . ( She cancelled the appt she scheduled with [facility] gynecologist) - she is requesting the most recent lab results be faxed to Dr. Verdis Frederickson  ( phone # [phone]) I did fax these results to fax # [phone].  Patient was asking if it is o.k. to have same Abx right behind each other .  States had IV abx in hospital, was given Augmentin one week ago and again this week? She states she trusts Dr. Linford Arnold but just wanted to be sure.  She also wanted to know when she should schedule a follow up with Dr. Linford Arnold?

## 2023-09-30 NOTE — Telephone Encounter (Signed)
Yes, I purposely sent in the same prescription to just give her a longer course since it did seem to at least reduce some of the inflammation in her abdomen from the first scan to the second scan.  Hopefully she will tolerate it well.

## 2023-10-05 ENCOUNTER — Encounter: Payer: Medicare Other | Admitting: Obstetrics and Gynecology

## 2023-10-06 NOTE — Telephone Encounter (Signed)
Patient was informed and today is her last day of antibiotics

## 2023-10-31 ENCOUNTER — Ambulatory Visit: Payer: Medicare Other | Admitting: Family Medicine

## 2023-10-31 VITALS — BP 101/73 | HR 82 | Temp 98.4°F | Ht 60.0 in | Wt 124.0 lb

## 2023-10-31 DIAGNOSIS — M810 Age-related osteoporosis without current pathological fracture: Secondary | ICD-10-CM | POA: Diagnosis not present

## 2023-10-31 MED ORDER — DENOSUMAB 60 MG/ML ~~LOC~~ SOSY
60.0000 mg | PREFILLED_SYRINGE | Freq: Once | SUBCUTANEOUS | Status: AC
  Administered 2023-10-31: 60 mg via SUBCUTANEOUS

## 2023-10-31 NOTE — Progress Notes (Signed)
Patient is here for a Prolia injection. Patient reports taking calcium and vitamin D daily. Patient's calcium levels and kidney function was within normal limits.   Patient tolerated injection on Left arm well without complications. Patient advised to schedule next injection in 6 months.   

## 2023-10-31 NOTE — Progress Notes (Signed)
 Agree with documentation as above.   Nani Gasser, MD

## 2023-11-01 ENCOUNTER — Ambulatory Visit (INDEPENDENT_AMBULATORY_CARE_PROVIDER_SITE_OTHER): Payer: Medicare Other

## 2023-11-01 VITALS — BP 101/73 | Ht 60.0 in | Wt 124.0 lb

## 2023-11-01 DIAGNOSIS — M810 Age-related osteoporosis without current pathological fracture: Secondary | ICD-10-CM

## 2023-11-01 DIAGNOSIS — Z1159 Encounter for screening for other viral diseases: Secondary | ICD-10-CM

## 2023-11-01 DIAGNOSIS — Z0000 Encounter for general adult medical examination without abnormal findings: Secondary | ICD-10-CM | POA: Diagnosis not present

## 2023-11-01 NOTE — Progress Notes (Signed)
 Subjective:   Valerie Fisher is a 73 y.o. female who presents for Medicare Annual (Subsequent) preventive examination.  Visit Complete: Virtual I connected with  Eleisha Whitfill on 11/01/23 by a audio enabled telemedicine application and verified that I am speaking with the correct person using two identifiers.  Patient Location: Home  Provider Location: Office/Clinic  I discussed the limitations of evaluation and management by telemedicine. The patient expressed understanding and agreed to proceed.  Vital Signs: Because this visit was a virtual/telehealth visit, some criteria may be missing or patient reported. Any vitals not documented were not able to be obtained and vitals that have been documented are patient reported.  Patient Medicare AWV questionnaire was completed by the patient on 10/30/2023; I have confirmed that all information answered by patient is correct and no changes since this date.  Cardiac Risk Factors include: advanced age (>58men, >22 women)     Objective:    Today's Vitals   11/01/23 1438  BP: 101/73  Weight: 124 lb (56.2 kg)  Height: 5' (1.524 m)   Body mass index is 24.22 kg/m.     11/01/2023    2:51 PM 11/11/2022   10:41 AM 10/29/2022    3:03 PM 01/07/2021    7:19 AM 11/10/2020    2:31 PM 03/05/2019    9:31 AM  Advanced Directives  Does Patient Have a Medical Advance Directive? Yes Yes Yes No Yes No  Type of Estate agent of [place];Living will Living will Living will  Healthcare Power of [place];Living will   Does patient want to make changes to medical advance directive?  No - Patient declined No - Patient declined  No - Patient declined   Copy of Healthcare Power of Attorney in Chart? No - copy requested    No - copy requested   Would patient like information on creating a medical advance directive?    No - Patient declined  Yes (MAU/Ambulatory/Procedural Areas - Information given)    Current Medications  (verified) Outpatient Encounter Medications as of 11/01/2023  Medication Sig   Calcium Carbonate-Vit D-Min (CALCIUM 1200 PO) Take 1 tablet by mouth daily.   denosumab (PROLIA) 60 MG/ML SOSY injection Inject 60 mg into the skin every 6 (six) months.   fluticasone (FLONASE) 50 MCG/ACT nasal spray Place 1-2 sprays into both nostrils daily as needed for allergies or rhinitis.   levETIRAcetam (KEPPRA) 500 MG tablet Take 1,000 mg by mouth See admin instructions. Take 1000 mg in the morning and 1000 mg in the evening   Vitamin D, Ergocalciferol, 50000 units CAPS Take 1 capsule (50,000 Units total) by mouth every 7 (seven) days.   zonisamide (ZONEGRAN) 100 MG capsule Take 200 mg by mouth at bedtime. 200 mg daily   [DISCONTINUED] amoxicillin-clavulanate (AUGMENTIN) 875-125 MG tablet Take 1 tablet by mouth 2 (two) times daily. (Patient not taking: Reported on 10/31/2023)   [DISCONTINUED] aspirin-acetaminophen-caffeine (EXCEDRIN MIGRAINE) 250-250-65 MG tablet Take 1 tablet by mouth every 6 (six) hours as needed for headache.   Facility-Administered Encounter Medications as of 11/01/2023  Medication   [START ON 11/14/2023] denosumab (PROLIA) injection 60 mg    Allergies (verified) Hydrocodone-acetaminophen, Hydromorphone hcl, Morphine, Pseudoephedrine hcl, Other, and Zofran [ondansetron]   History: Past Medical History:  Diagnosis Date   Arthritis 2000   Not sure date mild   Cancer (HCC)    Breast Cancer 2010 right   Cataract 2022   No need for surgery, not effecting sight   Fatty liver  GERD (gastroesophageal reflux disease)    History of diverticular abscess of colon 12/12/2017   PONV (postoperative nausea and vomiting)    also slow to wake up   Seizures (HCC)    last grand mal seizure was 14 years ago (as of 01/05/21)   Past Surgical History:  Procedure Laterality Date   bladder lift     BREAST SURGERY  2010   COLOSTOMY     COLOSTOMY REVERSAL  02/2019   INCISIONAL HERNIA REPAIR N/A  01/07/2021   Procedure: LAPAROSCOPIC INCISIONAL HERNIA WITH MESH;  Surgeon: Abigail Miyamoto, MD;  Location: [facility] OR;  Service: General;  Laterality: N/A;   SMALL INTESTINE SURGERY  2019 April   Family History  Problem Relation Age of Onset   Diabetes Father    Heart attack Father        mid 47s   Heart disease Father    Varicose Veins Father    Colon cancer Neg Hx    Pancreatic cancer Neg Hx    Stomach cancer Neg Hx    Esophageal cancer Neg Hx    Liver cancer Neg Hx    Social History   Socioeconomic History   Marital status: Married    Spouse name: BJ   Number of children: 3   Years of education: 20   Highest education level: Master's degree (e.g., MA, MS, MEng, MEd, MSW, MBA)  Occupational History   Occupation: retired Runner, broadcasting/film/video  Tobacco Use   Smoking status: Never   Smokeless tobacco: Never  Vaping Use   Vaping status: Never Used  Substance and Sexual Activity   Alcohol use: Not Currently   Drug use: Never   Sexual activity: Yes    Partners: Male    Birth control/protection: None  Other Topics Concern   Not on file  Social History Narrative   Lives with her husband. They have three children. They have one dog. In her free time, she enjoys cooking and exercising.    Social Drivers of Corporate investment banker Strain: Low Risk  (11/01/2023)   Overall Financial Resource Strain (CARDIA)    Difficulty of Paying Living Expenses: Not very hard  Food Insecurity: No Food Insecurity (11/01/2023)   Hunger Vital Sign    Worried About Running Out of Food in the Last Year: Never true    Ran Out of Food in the Last Year: Never true  Transportation Needs: No Transportation Needs (11/01/2023)   PRAPARE - Administrator, Civil Service (Medical): No    Lack of Transportation (Non-Medical): No  Physical Activity: Sufficiently Active (11/01/2023)   Exercise Vital Sign    Days of Exercise per Week: 6 days    Minutes of Exercise per Session: 50 min  Stress: No Stress  Concern Present (11/01/2023)   [employer] of Occupational Health - Occupational Stress Questionnaire    Feeling of Stress : Only a little  Social Connections: Socially Integrated (11/01/2023)   Social Connection and Isolation Panel [NHANES]    Frequency of Communication with Friends and Family: More than three times a week    Frequency of Social Gatherings with Friends and Family: Twice a week    Attends Religious Services: More than 4 times per year    Active Member of [employer] or Organizations: Yes    Attends Engineer, structural: More than 4 times per year    Marital Status: Married    Tobacco Counseling Counseling given: Not Answered   Clinical Intake:  Pre-visit  preparation completed: Yes  Pain : No/denies pain     BMI - recorded: 24.22 Nutritional Status: BMI of 19-24  Normal Nutritional Risks: None Diabetes: No  How often do you need to have someone help you when you read instructions, pamphlets, or other written materials from your doctor or pharmacy?: 1 - Never What is the last grade level you completed in school?: 22  Interpreter Needed?: No      Activities of Daily Living    11/01/2023    2:40 PM 10/30/2023    5:18 PM  In your present state of health, do you have any difficulty performing the following activities:  Hearing? 0 0  Vision? 0 0  Difficulty concentrating or making decisions? 0 0  Walking or climbing stairs? 0 0  Dressing or bathing? 0 0  Doing errands, shopping? 0 0  Preparing Food and eating ? N N  Using the Toilet? N N  In the past six months, have you accidently leaked urine? N N  Do you have problems with loss of bowel control? N N  Managing your Medications? N N  Managing your Finances? N N  Housekeeping or managing your Housekeeping? N N    Patient Care Team: Agapito Games, MD as PCP - General (Family Medicine) Gabriel Carina, [facility] (Inactive) as Pharmacist (Pharmacist)  Indicate any recent Medical Services  you may have received from other than [facility] providers in the past year (date may be approximate).     Assessment:   This is a routine wellness examination for Valerie Fisher.  Hearing/Vision screen No results found.   Goals Addressed             This Visit's Progress    Patient Stated       She would like to declutter her home this year.        Depression Screen    11/01/2023    2:48 PM 11/11/2022   10:38 AM 10/29/2022    3:03 PM 07/16/2022    1:32 PM 05/28/2022    2:59 PM 01/19/2022    2:02 PM 03/16/2021    3:02 PM  PHQ 2/9 Scores  PHQ - 2 Score 0 0 0 0 0 0 0  PHQ- 9 Score      2     Fall Risk    11/01/2023    2:52 PM 10/30/2023    5:18 PM 11/11/2022   10:38 AM 10/29/2022    3:03 PM 10/28/2022    7:22 PM  Fall Risk   Falls in the past year? 0 0 0 0 0  Number falls in past yr: 0  0 0   Injury with Fall? 0  0 0   Risk for fall due to : No Fall Risks  No Fall Risks No Fall Risks   Follow up Falls evaluation completed  Falls evaluation completed Falls evaluation completed     MEDICARE RISK AT HOME: Medicare Risk at Home Any stairs in or around the home?: Yes If so, are there any without handrails?: Yes Home free of loose throw rugs in walkways, pet beds, electrical cords, etc?: Yes Adequate lighting in your home to reduce risk of falls?: Yes Life alert?: No Use of a cane, walker or w/c?: No Grab bars in the bathroom?: Yes Shower chair or bench in shower?: No Elevated toilet seat or a handicapped toilet?: No  TIMED UP AND GO:  Was the test performed?  No    Cognitive Function:  11/01/2023    2:52 PM 10/29/2022    3:07 PM 11/10/2020    2:32 PM  6CIT Screen  What Year? 0 points 0 points 0 points  What month? 0 points 0 points 0 points  What time? 0 points 0 points 0 points  Count back from 20 0 points 2 points 2 points  Months in reverse 0 points 0 points 0 points  Repeat phrase 0 points 0 points 2 points  Total Score 0 points 2 points 4 points     Immunizations Immunization History  Administered Date(s) Administered   Fluad Quad(high Dose 65+) 05/02/2019, 05/30/2020, 04/14/2021   Fluad Trivalent(High Dose 65+) 04/11/2023   Influenza, High Dose Seasonal PF 06/03/2018   Influenza, Seasonal, Injecte, Preservative Fre 05/17/2015   Influenza-Unspecified 05/11/2022   PFIZER(Purple Top)SARS-COV-2 Vaccination 09/16/2019, 10/14/2019, 11/07/2019, 06/14/2021   PNEUMOCOCCAL CONJUGATE-20 11/11/2022   Pfizer(Comirnaty)Fall Seasonal Vaccine 12 years and older 06/11/2022   Pneumococcal Conjugate-13 08/26/2014   Pneumococcal Polysaccharide-23 09/08/2015   Tdap 07/26/2008, 08/04/2018   Zoster Recombinant(Shingrix) 07/14/2021, 10/14/2021   Zoster, Live 08/15/2014    TDAP status: Up to date  Flu Vaccine status: Up to date  Pneumococcal vaccine status: Up to date  Covid-19 vaccine status: Completed vaccines  Qualifies for Shingles Vaccine? Yes   Zostavax completed Yes   Shingrix Completed?: Yes  Screening Tests Health Maintenance  Topic Date Due   Hepatitis C Screening  Never done   COVID-19 Vaccine (6 - 2024-25 season) 11/16/2023 (Originally 04/17/2023)   MAMMOGRAM  02/18/2024   Medicare Annual Wellness (AWV)  10/31/2024   DTaP/Tdap/Td (3 - Td or Tdap) 08/04/2028   Pneumonia Vaccine 22+ Years old  Completed   INFLUENZA VACCINE  Completed   DEXA SCAN  Completed   Zoster Vaccines- Shingrix  Completed   HPV VACCINES  Aged Out   Colonoscopy  Discontinued    Health Maintenance  Health Maintenance Due  Topic Date Due   Hepatitis C Screening  Never done    Colorectal cancer screening: No longer required.   Mammogram status: Completed 02/18/2023. Repeat every year  Bone Density status: Ordered 11/01/2023. Pt provided with contact info and advised to call to schedule appt.  Lung Cancer Screening: (Low Dose CT Chest recommended if Age 34-80 years, 20 pack-year currently smoking OR have quit w/in 15years.) does not qualify.    Lung Cancer Screening Referral: n/a  Additional Screening:  Hepatitis C Screening: does qualify; Completed not done  Vision Screening: Recommended annual ophthalmology exams for early detection of glaucoma and other disorders of the eye. Is the patient up to date with their annual eye exam?  Yes  Who is the provider or what is the name of the office in which the patient attends annual eye exams? [facility] [place] If pt is not established with a provider, would they like to be referred to a provider to establish care?  N/a .   Dental Screening: Recommended annual dental exams for proper oral hygiene    Community Resource Referral / Chronic Care Management: CRR required this visit?  No   CCM required this visit?  No     Plan:     I have personally reviewed and noted the following in the patient's chart:   Medical and social history Use of alcohol, tobacco or illicit drugs  Current medications and supplements including opioid prescriptions. Patient is not currently taking opioid prescriptions. Functional ability and status Nutritional status Physical activity Advanced directives List of other physicians Hospitalizations, surgeries, and ER  visits in previous 12 months Vitals Screenings to include cognitive, depression, and falls Referrals and appointments  In addition, I have reviewed and discussed with patient certain preventive protocols, quality metrics, and best practice recommendations. A written personalized care plan for preventive services as well as general preventive health recommendations were provided to patient.     Esmond Harps, CMA   11/01/2023   After Visit Summary: (Mail) Due to this being a telephonic visit, the after visit summary with patients personalized plan was offered to patient via mail   Nurse Notes:   Valerie Fisher is a 73 y.o. female patient of Metheney, Barbarann Ehlers, MD who had a Medicare Annual Wellness Visit today via  telephone. Valerie Fisher is Retired and lives with their spouse. She has 3 children. She reports that she is socially active and does interact with friends/family regularly. She is moderately physically active and enjoys cooking and exercising.    Ordered bone density today.   Ordered Hep C screening.

## 2023-11-01 NOTE — Patient Instructions (Signed)
  Valerie Fisher , Thank you for taking time to come for your Medicare Wellness Visit. I appreciate your ongoing commitment to your health goals. Please review the following plan we discussed and let me know if I can assist you in the future.   These are the goals we discussed:  Goals       Medication Management      Patient Goals/Self-Care Activities Over the next 180 days, patient will:  take medications as prescribed  Follow Up Plan: Telephone follow up appointment with care management team member scheduled for:  6 months       Patient Stated (pt-stated)      11/10/2020 AWV Goal: Improved Nutrition/Diet  Patient will verbalize understanding that diet plays an important role in overall health and that a poor diet is a risk factor for many chronic medical conditions.  Over the next year, patient will improve self management of their diet by incorporating more water. Patient will utilize available community resources to help with food acquisition if needed (ex: food pantries, [address], etc) Patient will work with nutrition specialist if a referral was made       Patient Stated (pt-stated)      Patient stated that she would like to have a better exercise routine.      Patient Stated      She would like to declutter her home this year.         This is a list of the screening recommended for you and due dates:  Health Maintenance  Topic Date Due   Hepatitis C Screening  Never done   COVID-19 Vaccine (6 - 2024-25 season) 11/16/2023*   Mammogram  02/18/2024   Medicare Annual Wellness Visit  10/31/2024   DTaP/Tdap/Td vaccine (3 - Td or Tdap) 08/04/2028   Pneumonia Vaccine  Completed   Flu Shot  Completed   DEXA scan (bone density measurement)  Completed   Zoster (Shingles) Vaccine  Completed   HPV Vaccine  Aged Out   Colon Cancer Screening  Discontinued  *Topic was postponed. The date shown is not the original due date.

## 2023-11-04 ENCOUNTER — Encounter: Payer: Self-pay | Admitting: Family Medicine

## 2023-11-04 LAB — HEPATITIS C ANTIBODY: Hep C Virus Ab: NONREACTIVE

## 2023-11-04 NOTE — Progress Notes (Signed)
Negative for hepatitis C.  We just screen once in your lifetime.

## 2023-11-23 ENCOUNTER — Ambulatory Visit

## 2023-11-23 DIAGNOSIS — M810 Age-related osteoporosis without current pathological fracture: Secondary | ICD-10-CM | POA: Diagnosis not present

## 2023-11-29 ENCOUNTER — Ambulatory Visit: Payer: Self-pay

## 2023-11-29 NOTE — Telephone Encounter (Signed)
 Copied from CRM [id number]. Topic: Clinical - Red Word Triage >> Nov 29, 2023  1:52 PM Valerie Fisher wrote: Red Word that prompted transfer to Nurse Triage: Patient states she had surgery this past Friday for a cyst in her abdomen. She now thinks she has a UTI. Says she has had it since Friday night & needs to see Dr. Greer Leak. Symptoms are burning when urinating, side and back pain, & when she goes to the restroom, she go & stop & then again.  Chief Complaint: burning with urination, retention, pain Symptoms: see above Frequency: since Friday Pertinent Negatives: Patient denies fever, blood in urine Disposition: [] ED /[] Urgent Care (no appt availability in office) / [x] Appointment(In office/virtual)/ []  [facility]/ [] Home Care/ [] Refused Recommended Disposition /[] [facility]/ []  Follow-up with PCP Additional Notes: per protocol apt made for tomorrow; care advice given, denies questions; instructed to go to ER if becomes worse.   Reason for Disposition  Urinating more frequently than usual (i.e., frequency)  Answer Assessment - Initial Assessment Questions 1. SYMPTOM: "What's the main symptom you're concerned about?" (e.g., frequency, incontinence)     Burning with urination, side and back pain, retention 2. ONSET: "When did the  pain  start?"     Friday 3. PAIN: "Is there any pain?" If Yes, ask: "How bad is it?" (Scale: 1-10; mild, moderate, severe)     burning 4. CAUSE: "What do you think is causing the symptoms?"     uti 5. OTHER SYMPTOMS: "Do you have any other symptoms?" (e.g., blood in urine, fever, flank pain, pain with urination)     Flank pain 6. PREGNANCY: "Is there any chance you are pregnant?" "When was your last menstrual period?"     no  Protocols used: Urinary Symptoms-A-AH

## 2023-11-30 ENCOUNTER — Encounter: Payer: Self-pay | Admitting: Family Medicine

## 2023-11-30 ENCOUNTER — Ambulatory Visit: Admitting: Medical-Surgical

## 2023-11-30 ENCOUNTER — Encounter: Payer: Self-pay | Admitting: Medical-Surgical

## 2023-11-30 VITALS — BP 131/79 | HR 94 | Resp 20 | Ht 60.0 in | Wt 123.8 lb

## 2023-11-30 DIAGNOSIS — M8580 Other specified disorders of bone density and structure, unspecified site: Secondary | ICD-10-CM | POA: Insufficient documentation

## 2023-11-30 DIAGNOSIS — N3001 Acute cystitis with hematuria: Secondary | ICD-10-CM | POA: Diagnosis not present

## 2023-11-30 DIAGNOSIS — Z780 Asymptomatic menopausal state: Secondary | ICD-10-CM | POA: Insufficient documentation

## 2023-11-30 LAB — POCT URINALYSIS DIP (CLINITEK)
Bilirubin, UA: NEGATIVE
Glucose, UA: NEGATIVE mg/dL
Ketones, POC UA: NEGATIVE mg/dL
Nitrite, UA: NEGATIVE
Spec Grav, UA: 1.025 (ref 1.010–1.025)
Urobilinogen, UA: 0.2 U/dL
pH, UA: 5.5 (ref 5.0–8.0)

## 2023-11-30 MED ORDER — PHENAZOPYRIDINE HCL 100 MG PO TABS: 100.0000 mg | ORAL_TABLET | Freq: Three times a day (TID) | ORAL | 0 refills | Status: DC | PRN

## 2023-11-30 MED ORDER — NITROFURANTOIN MONOHYD MACRO 100 MG PO CAPS
100.0000 mg | ORAL_CAPSULE | Freq: Two times a day (BID) | ORAL | 0 refills | Status: DC
Start: 2023-11-30 — End: 2024-04-26

## 2023-11-30 NOTE — Progress Notes (Signed)
        Established patient visit  History, exam, impression, and plan:  1. Acute cystitis with hematuria (Primary) Pleasant 73 year old female presents today with complaints of burning with urination along with low back pain and left-sided flank pain.  She had her appendix, ovaries, and tubes removed on Friday through a robotic assisted laparoscopic procedure.  Reports that she had difficulty urinating and was finally able to void that evening.  Over the weekend, voiding was uncomfortable.  Today she reports that she is concerned that this may be related to her surgery but is also worried about potential infection.  Has not been taking any medications or herbal supplements to manage symptoms.  On exam, axial low back tenderness noted, likely related to surgical procedure.  No left CVA tenderness.  Denies fever, chills, nausea, and vomiting.  She is having some vaginal spotting after her procedure.  POCT urinalysis positive for blood, small amount of protein, and trace leukocytes.  Plan send for culture.  The blood and protein are likely due to vaginal spotting postprocedural.  Recommend staying very well-hydrated to push fluids and flush her system.  After discussion of options, she is very concerned about her kidneys and would like to proceed with empiric treatment.  Adding nitrofurantoin 100 mg twice daily and phenazopyridine 100 mg 3 times daily as needed.  When culture results come back, if bacterial growth is present, we will let her know if there is a need to change antibiotic.  We did spend some time discussing the different things that can cause dysuria including atrophic vaginitis, constipation, manipulation during surgical procedures, and catheterization for surgery.  Reviewed bladder hygiene recommendations. - POCT URINALYSIS DIP (CLINITEK) - Urine Culture - phenazopyridine (PYRIDIUM) 100 MG tablet; Take 1 tablet (100 mg total) by mouth 3 (three) times daily as needed for pain.  Dispense: 15  tablet; Refill: 0 - nitrofurantoin, macrocrystal-monohydrate, (MACROBID) 100 MG capsule; Take 1 capsule (100 mg total) by mouth 2 (two) times daily.  Dispense: 10 capsule; Refill: 0  Procedures performed this visit: None.  Return if symptoms worsen or fail to improve.  __________________________________ Maryl Snook, DNP, APRN, FNP-BC Primary Care and Sports Medicine [facility] [place]

## 2023-11-30 NOTE — Telephone Encounter (Signed)
 FYI

## 2023-11-30 NOTE — Progress Notes (Signed)
 DEXA shows T score in the range of Osteopenia   The current recommendation for osteopenia (mildly thin bones) treatment includes:   #1 calcium-total of 1200 mg of calcium daily.  If you eat a very calcium rich diet you may be able to obtain that without a supplement.  If not, then I recommend calcium 500 mg twice a day.  There are several products over-the-counter such as Caltrate D and Viactiv chews which are great options that contain calcium and vitamin D. #2 vitamin D-recommend 800 international units daily. #3 exercise-recommend 30 minutes of weightbearing exercise 3 days a week.  Resistance training ,such as doing bands and light weights, can be particularly helpful.

## 2023-12-01 ENCOUNTER — Ambulatory Visit: Admitting: Medical-Surgical

## 2023-12-03 ENCOUNTER — Encounter: Payer: Self-pay | Admitting: Medical-Surgical

## 2023-12-03 LAB — URINE CULTURE

## 2023-12-03 MED ORDER — SULFAMETHOXAZOLE-TRIMETHOPRIM 800-160 MG PO TABS: 1.0000 | ORAL_TABLET | Freq: Two times a day (BID) | ORAL | 0 refills | Status: DC

## 2023-12-03 NOTE — Addendum Note (Signed)
 Addended byCherre Cornish on: 12/03/2023 07:38 PM   Modules accepted: Orders

## 2023-12-05 ENCOUNTER — Telehealth: Payer: Self-pay

## 2023-12-05 NOTE — Telephone Encounter (Signed)
 Copied from CRM [id number]. Topic: Clinical - Lab/Test Results >> Dec 01, 2023  4:46 PM Adrianna P wrote: Reason for CRM: Patient calling back for test results

## 2023-12-05 NOTE — Telephone Encounter (Signed)
 Patient informed and will start Bactrim 

## 2023-12-12 ENCOUNTER — Telehealth: Payer: Self-pay

## 2023-12-12 NOTE — Telephone Encounter (Signed)
 Is it ok to order a urine culture.

## 2023-12-12 NOTE — Telephone Encounter (Signed)
 Copied from CRM [id number]. Topic: Clinical - Request for Lab/Test Order >> Dec 12, 2023  3:06 PM Suzette B wrote: Patient is wanting to requesting a UA due to previous UTI, patient stated that she had been placed on a serious of antibiotics and she isn't having any symptoms or anything, but she wants to make sure the pain is not from another UTI and just the surgery she recently had. [phone]

## 2023-12-13 ENCOUNTER — Other Ambulatory Visit: Payer: Self-pay | Admitting: *Deleted

## 2023-12-13 DIAGNOSIS — N3001 Acute cystitis with hematuria: Secondary | ICD-10-CM

## 2023-12-14 NOTE — Telephone Encounter (Signed)
 Patient is aware

## 2023-12-15 ENCOUNTER — Encounter: Payer: Self-pay | Admitting: Family Medicine

## 2023-12-15 LAB — URINE CULTURE

## 2023-12-15 NOTE — Progress Notes (Signed)
 Hi Talar, urine culture is negative no sign of urinary tract infection.  Just stay well-hydrated and try to empty bladder regularly.

## 2024-01-15 ENCOUNTER — Other Ambulatory Visit: Payer: Self-pay | Admitting: Family Medicine

## 2024-01-15 DIAGNOSIS — E559 Vitamin D deficiency, unspecified: Secondary | ICD-10-CM

## 2024-03-13 ENCOUNTER — Ambulatory Visit

## 2024-03-13 ENCOUNTER — Ambulatory Visit (INDEPENDENT_AMBULATORY_CARE_PROVIDER_SITE_OTHER): Admitting: Sports Medicine

## 2024-03-13 ENCOUNTER — Encounter: Payer: Self-pay | Admitting: Sports Medicine

## 2024-03-13 VITALS — BP 114/76 | HR 76 | Resp 20 | Ht 60.0 in | Wt 122.0 lb

## 2024-03-13 DIAGNOSIS — M25511 Pain in right shoulder: Secondary | ICD-10-CM | POA: Diagnosis not present

## 2024-03-13 DIAGNOSIS — M7541 Impingement syndrome of right shoulder: Secondary | ICD-10-CM

## 2024-03-13 DIAGNOSIS — M7542 Impingement syndrome of left shoulder: Secondary | ICD-10-CM

## 2024-03-13 DIAGNOSIS — R6889 Other general symptoms and signs: Secondary | ICD-10-CM

## 2024-03-13 DIAGNOSIS — G8929 Other chronic pain: Secondary | ICD-10-CM

## 2024-03-13 DIAGNOSIS — M25512 Pain in left shoulder: Secondary | ICD-10-CM | POA: Diagnosis not present

## 2024-03-13 NOTE — Assessment & Plan Note (Signed)
 Minimally positive impingement signs, weakness. Adding x-rays, formal PT, return to see me 6 weeks.

## 2024-03-13 NOTE — Progress Notes (Signed)
    Procedures performed today:    None.  Independent interpretation of notes and tests performed by another provider:   None.  Brief History, Exam, Impression, and Recommendations:    Abnormal ankle brachial index (ABI) Rojelio had a home visit, a screening ABI was done that showed minimally low readings, 0.8 and 0.7. She has no symptoms of intermittent claudication, no color change, good pulses. I explained to her that an abnormal ABI in the absence of symptoms was not entirely clinically relevant. I advised that the treatment would be to ensure controlling her risk factors such as smoking, blood sugar, blood pressure, which we do anyway. She will discuss this in further detail with her PCP.  Impingement syndrome of both shoulders Minimally positive impingement signs, weakness. Adding x-rays, formal PT, return to see me 6 weeks.  I spent 30 minutes of total time managing this patient today, this includes chart review, face to face, and non-face to face time.  ____________________________________________ Debby PARAS. Curtis, M.D., ABFM., CAQSM., AME. Primary Care and Sports Medicine [facility]  Adjunct Professor of [facility] Medicine  [employer]  Restaurant manager, fast food

## 2024-03-13 NOTE — Assessment & Plan Note (Signed)
 Valerie Fisher had a home visit, a screening ABI was done that showed minimally low readings, 0.8 and 0.7. She has no symptoms of intermittent claudication, no color change, good pulses. I explained to her that an abnormal ABI in the absence of symptoms was not entirely clinically relevant. I advised that the treatment would be to ensure controlling her risk factors such as smoking, blood sugar, blood pressure, which we do anyway. She will discuss this in further detail with her PCP.

## 2024-03-14 NOTE — Therapy (Signed)
 OUTPATIENT PHYSICAL THERAPY UPPER EXTREMITY EVALUATION   Patient Name: Valerie Fisher MRN: [id number] DOB:1950/12/31, 73 y.o., female Today's Date: 03/15/2024  END OF SESSION:  PT End of Session - 03/15/24 1408     Visit Number 1    Number of Visits 17    Date for PT Re-Evaluation 05/12/24    Authorization Type U[employer] MCR- requesting auth    PT Start Time 1408    PT Stop Time 1443    PT Time Calculation (min) 35 min    Activity Tolerance Patient tolerated treatment well    Behavior During Therapy WFL for tasks assessed/performed          Past Medical History:  Diagnosis Date   Arthritis 2000   Not sure date mild   Cancer (HCC)    Breast Cancer 2010 right   Cataract 2022   No need for surgery, not effecting sight   Fatty liver    GERD (gastroesophageal reflux disease)    History of diverticular abscess of colon 12/12/2017   PONV (postoperative nausea and vomiting)    also slow to wake up   Seizures (HCC)    last grand mal seizure was 14 years ago (as of 01/05/21)   Past Surgical History:  Procedure Laterality Date   bladder lift     BREAST SURGERY  2010   COLOSTOMY     COLOSTOMY REVERSAL  02/2019   INCISIONAL HERNIA REPAIR N/A 01/07/2021   Procedure: LAPAROSCOPIC INCISIONAL HERNIA WITH MESH;  Surgeon: Vernetta Berg, MD;  Location: [facility] OR;  Service: General;  Laterality: N/A;   SMALL INTESTINE SURGERY  2019 April   Patient Active Problem List   Diagnosis Date Noted   Abnormal ankle brachial index (ABI) 03/13/2024   Impingement syndrome of both shoulders 03/13/2024   Osteopenia after menopause 11/30/2023   Vitamin D  deficiency 01/19/2022   Compression fracture of thoracic vertebra with routine healing 04/14/2021   Incisional hernia 01/07/2021   Fatty liver 10/07/2020   Calculus of gallbladder without cholecystitis without obstruction 10/07/2020   Insomnia 05/07/2019   History of right breast cancer 03/05/2019   H/O colectomy 02/15/2019   DDD (degenerative  disc disease) 01/18/2019   History of diverticular abscess of colon 12/12/2017   Diverticulitis large intestine 12/12/2017   Auditory complaints of both ears 04/04/2016   Osteoporosis 03/28/2015   Absence epilepsy, not intractable, without status epilepticus (HCC) 05/15/2013   Generalized nonconvulsive epilepsy (HCC) 05/15/2013   History of absence seizures 05/15/2013   Rotator cuff tendonitis 04/23/2013   Internal hemorrhoids 03/30/2013    PCP: Alvan Dorothyann BIRCH, MD  REFERRING PROVIDER:   Curtis Debby PARAS, MD    REFERRING DIAG: [phone] (ICD-10-CM) - Impingement syndrome of both shoulders   THERAPY DIAG:  Chronic pain of both shoulders  Muscle weakness (generalized)  Abnormal posture  Rationale for Evaluation and Treatment: Rehabilitation  ONSET DATE: 2020  SUBJECTIVE:  SUBJECTIVE STATEMENT: Patient reports shoulder pain started several years ago. She had a shot before and it didn't help. Describes an ache in both shoulder and upper arm. She reports the pain just came on slowly without known cause. She reports the pain is getting worse because sleeping on her side is becoming more difficult due to pain. No previous injury the shoulders/neck. No popping/clicking. No numbness/tingling.   Hand dominance: Right  PERTINENT HISTORY: Breast cancer- underwent radiation  Seizures- last seizure in 2010 on medication  Osteoporosis   PAIN:  Are you having pain? Yes: NPRS scale: 4 currently; 7 at worst Pain location: bilateral shoulders and upper arms Pain description: ache Aggravating factors: sleeping on side, reaching overhead, excessive movement  Relieving factors: rest  PRECAUTIONS: Other: seizure, osteoporosis  RED FLAGS: None   WEIGHT BEARING RESTRICTIONS: No  FALLS:  Has  patient fallen in last 6 months? No  LIVING ENVIRONMENT: Lives with: lives with their spouse Lives in: House/apartment Stairs: Yes: Internal: flight steps; on right going up and External: 4 steps; on right going up Has following equipment at home: Vannie - 4 wheeled  OCCUPATION: Retired   PLOF: Independent  PATIENT GOALS: I want this pain to go away. Wants to get back into the gym.   NEXT MD VISIT: 04/24/24  OBJECTIVE:  Note: Objective measures were completed at Evaluation unless otherwise noted.  DIAGNOSTIC FINDINGS:  X-rays pending  PATIENT SURVEYS :  Quick DASH: 38.6% disability   COGNITION: Overall cognitive status: Within functional limits for tasks assessed     SENSATION: Not tested  POSTURE: Forward head, rounded shoulders   UPPER EXTREMITY ROM:   Active ROM Right eval Left eval  Shoulder flexion 165 supine pain  130 supine; pain  Shoulder extension    Shoulder abduction 130 supine pain  92 supine; pain  Shoulder adduction    Shoulder internal rotation 80 64 pain   Shoulder external rotation 60 pain 80  Elbow flexion    Elbow extension    Wrist flexion    Wrist extension    Wrist ulnar deviation    Wrist radial deviation    Wrist pronation    Wrist supination    (Blank rows = not tested)  UPPER EXTREMITY MMT:  MMT Right eval Left eval  Shoulder flexion 4 4  Shoulder extension    Shoulder abduction 4+  4+   Shoulder adduction    Shoulder internal rotation 5 5  Shoulder external rotation 4- 4-  Middle trapezius    Lower trapezius    Elbow flexion 5 5  Elbow extension 5 5  Wrist flexion    Wrist extension    Wrist ulnar deviation    Wrist radial deviation    Wrist pronation    Wrist supination    Grip strength (lbs)    (Blank rows = not tested)  SHOULDER SPECIAL TESTS: (+) Neers, Haw[place]'s [place], Empty Can     PALPATION:  No palpable tenderness about shoulders  [facility] Adult PT Treatment:                                                DATE: 03/15/24 Therapeutic Exercise: Demonstrated,performed, and issued initial HEP.   Self Care: Discussed gym options in the area    PATIENT EDUCATION: Education details: see treatment; POC  Person educated: Patient Education method: Explanation, Demonstration, Tactile cues, Verbal cues, and Handouts Education comprehension: verbalized understanding, returned demonstration, verbal cues required, tactile cues required, and needs further education  HOME EXERCISE PROGRAM: Access Code: [facility] URL: [email or web address] Date: 03/15/2024 Prepared by: Lucie Meeter  Exercises - Seated Scapular Retraction  - 2 x daily - 7 x weekly - 2 sets - 10 reps - Seated Cervical Retraction  - 2 x daily - 7 x weekly - 2 sets - 10 reps - Doorway Pec Stretch at 60 Elevation  - 2 x daily - 7 x weekly - 3 sets - 30 sec  hold - Shoulder Flexion Wall Slide with Towel  - 2 x daily - 7 x weekly - 1 sets - 10 reps  ASSESSMENT:  CLINICAL IMPRESSION: Patient is a 73 y.o. female who was seen today for physical therapy evaluation and treatment for impingement syndrome of both shoulders. Upon assessment she is noted to have positive impingement special tests, postural abnormalities, limited and painful shoulder AROM, and bilateral shoulder weakness. Patient will benefit from skilled PT to address the above stated deficits in order to optimize their function and assist in overall pain reduction.      OBJECTIVE IMPAIRMENTS: decreased activity tolerance, decreased endurance, decreased knowledge of condition, decreased ROM, decreased strength, impaired flexibility, impaired UE functional use, improper body mechanics, postural dysfunction, and pain.   ACTIVITY LIMITATIONS: carrying, lifting, sleeping, reach over head, and hygiene/grooming  PARTICIPATION LIMITATIONS: meal  prep, cleaning, laundry, community activity, and yard work  PERSONAL FACTORS: Age, Fitness, Time since onset of injury/illness/exacerbation, and 3+ comorbidities: see PMH above are also affecting patient's functional outcome.   REHAB POTENTIAL: Good  CLINICAL DECISION MAKING: Evolving/moderate complexity  EVALUATION COMPLEXITY: Moderate  GOALS: Goals reviewed with patient? Yes  SHORT TERM GOALS: Target date: 04/12/2024    Patient will be independent and compliant with initial HEP.   Baseline: initial HEP issued  Goal status: INITIAL  2.  Patient will demonstrate at least 150 degrees of Lt shoulder flexion AROM to improve ability to complete reaching activity.  Baseline: see above Goal status: INITIAL  3.  Patient will demonstrate functional and pain free bilateral shoulder rotation AROM to improve ability to complete self-care activities.  Baseline: see above Goal status: INITIAL  4.  Patient will demonstrate knowledge and application of proper posture to reduce stress on her shoulders.  Baseline: forward head/rounded shoulders  Goal status: INITIAL   LONG TERM GOALS: Target date: 05/12/24  Patient will score </= 25 % disability on the QuickDASH (MCID is 8-15.9) to signify clinically meaningful improvement in functional abilities.   Baseline: see above Goal status: INITIAL  2.  Patient will demonstrate 4+/5 shoulder flexion and ER strength to improve ability to lift and carry items.  Baseline: see above Goal status: INITIAL  3.  Patient will report pain at worst rated as </= 3/10 to reduce current functional limitations.   Baseline: 7 Goal status: INITIAL  4.  Patient will be independent with advanced home program  to progress/maintain current level of function.  Baseline: initial HEP issued  Goal status: INITIAL  PLAN: PT FREQUENCY: 2x/week  PT DURATION: 8 weeks  PLANNED INTERVENTIONS: 97164- PT Re-evaluation, 97750- Physical Performance Testing,  97110-Therapeutic exercises, 97530- Therapeutic activity, [id number]- Neuromuscular re-education, 97535- Self Care, 02859- Manual therapy, 20560 (1-2 muscles), 20561 (3+ muscles)- Dry Needling, Cryotherapy, and Moist heat  PLAN FOR NEXT SESSION: shoulder AAROM to tolerance; postural strengthening, rotator cuff strengthening. POSTURE EDUCATION  Marillyn Goren, PT, DPT, ATC 03/15/24 5:07 PM

## 2024-03-15 ENCOUNTER — Ambulatory Visit: Payer: Self-pay

## 2024-03-15 ENCOUNTER — Other Ambulatory Visit: Payer: Self-pay

## 2024-03-15 ENCOUNTER — Ambulatory Visit: Attending: Sports Medicine

## 2024-03-15 DIAGNOSIS — M6281 Muscle weakness (generalized): Secondary | ICD-10-CM | POA: Insufficient documentation

## 2024-03-15 DIAGNOSIS — M25511 Pain in right shoulder: Secondary | ICD-10-CM | POA: Insufficient documentation

## 2024-03-15 DIAGNOSIS — M25512 Pain in left shoulder: Secondary | ICD-10-CM | POA: Diagnosis present

## 2024-03-15 DIAGNOSIS — R293 Abnormal posture: Secondary | ICD-10-CM | POA: Diagnosis present

## 2024-03-15 DIAGNOSIS — G8929 Other chronic pain: Secondary | ICD-10-CM | POA: Insufficient documentation

## 2024-03-15 DIAGNOSIS — M7542 Impingement syndrome of left shoulder: Secondary | ICD-10-CM | POA: Diagnosis not present

## 2024-03-15 DIAGNOSIS — M7541 Impingement syndrome of right shoulder: Secondary | ICD-10-CM | POA: Insufficient documentation

## 2024-03-15 NOTE — Telephone Encounter (Signed)
  FYI Only or Action Required?: FYI only for provider.  Patient was last seen in primary care on 03/13/2024 by Curtis Debby PARAS, MD.  Called Nurse Triage reporting Rash.  Symptoms began today.  Interventions attempted: Nothing.  Symptoms are: gradually worsening.  Triage Disposition: See HCP Within 4 Hours (Or PCP Triage)  Patient/caregiver understands and will follow disposition?: Yes Copied from CRM [id number]. Topic: Clinical - Red Word Triage >> Mar 15, 2024 12:25 PM Mercer PEDLAR wrote: Red Word that prompted transfer to Nurse Triage: Redness on side of face turning to rash. Reason for Disposition  Face becomes swollen  Answer Assessment - Initial Assessment Questions 1. CALLER DIAGNOSIS: What do you think is causing the rash? (e.g., athlete's foot, chickenpox, hives, impetigo)     Was putting on makeup and put blush on and it turned into a rash 2. LOCALIZED OR WIDESPREAD:  Is the rash all over (widespread) or mostly just in one area of the body (localized)?      Left side of face 3. NEW MEDICINES: Are you taking any new medicine?     denies 4. APPEARANCE of RASH: What does the rash look like? What color is it?  Note: It is difficult to assess rash color in people with darker-colored skin. When this situation occurs, simply ask the caller to describe what they see.     One big red place that is spreading with little red bumps 5. FEVER: Do you have a fever? If Yes, ask: What is your temperature, how was it measured, and when did it start?     no 6. PREGNANCY: Is there any chance you are pregnant? When was your last menstrual period?     no  Answer Assessment - Initial Assessment Questions 1. APPEARANCE of RASH: What does the rash look like? (e.g., blisters, dry flaky skin, red spots, redness, sores)     Red spots 2. SIZE: How big are the spots? (e.g., tip of pen, eraser, coin; inches, centimeters)     tiny 3. LOCATION: Where is the rash located?      face 4. COLOR: What color is the rash? (Note: It is difficult to assess rash color in people with darker-colored skin. When this situation occurs, simply ask the caller to describe what they see.)     red 5. ONSET: When did the rash begin?     today 6. FEVER: Do you have a fever? If Yes, ask: What is your temperature, how was it measured, and when did it start?     denies 7. ITCHING: Does the rash itch? If Yes, ask: How bad is the itch? (Scale 1-10; or mild, moderate, severe)     no 8. CAUSE: What do you think is causing the rash?     Unknown, maybe makeup 9. MEDICINE FACTORS: Have you started any new medicines within the last 2 weeks? (e.g., antibiotics)      denies 10. OTHER SYMPTOMS: Do you have any other symptoms? (e.g., dizziness, headache, sore throat, joint pain)       denies 11. PREGNANCY: Is there any chance you are pregnant? When was your last menstrual period?       na  Protocols used: Rash - Guideline Selection-A-AH, Rash or Redness - [facility]

## 2024-03-18 ENCOUNTER — Ambulatory Visit: Payer: Self-pay | Admitting: Sports Medicine

## 2024-03-27 ENCOUNTER — Encounter

## 2024-03-29 ENCOUNTER — Encounter

## 2024-04-02 ENCOUNTER — Encounter

## 2024-04-05 ENCOUNTER — Encounter

## 2024-04-09 ENCOUNTER — Encounter

## 2024-04-11 ENCOUNTER — Encounter

## 2024-04-12 ENCOUNTER — Encounter

## 2024-04-17 ENCOUNTER — Encounter: Payer: Self-pay | Admitting: Sports Medicine

## 2024-04-24 ENCOUNTER — Ambulatory Visit: Admitting: Sports Medicine

## 2024-04-26 ENCOUNTER — Ambulatory Visit (INDEPENDENT_AMBULATORY_CARE_PROVIDER_SITE_OTHER): Admitting: Family Medicine

## 2024-04-26 ENCOUNTER — Encounter: Payer: Self-pay | Admitting: Family Medicine

## 2024-04-26 VITALS — BP 117/82 | HR 93 | Ht 60.0 in | Wt 124.0 lb

## 2024-04-26 DIAGNOSIS — Z636 Dependent relative needing care at home: Secondary | ICD-10-CM | POA: Diagnosis not present

## 2024-04-26 DIAGNOSIS — M810 Age-related osteoporosis without current pathological fracture: Secondary | ICD-10-CM

## 2024-04-26 DIAGNOSIS — M7541 Impingement syndrome of right shoulder: Secondary | ICD-10-CM

## 2024-04-26 DIAGNOSIS — M7542 Impingement syndrome of left shoulder: Secondary | ICD-10-CM

## 2024-04-26 DIAGNOSIS — R6889 Other general symptoms and signs: Secondary | ICD-10-CM

## 2024-04-26 DIAGNOSIS — Z1231 Encounter for screening mammogram for malignant neoplasm of breast: Secondary | ICD-10-CM | POA: Diagnosis not present

## 2024-04-26 DIAGNOSIS — K760 Fatty (change of) liver, not elsewhere classified: Secondary | ICD-10-CM | POA: Diagnosis not present

## 2024-04-26 MED ORDER — FLUOXETINE HCL 10 MG PO TABS: 10.0000 mg | ORAL_TABLET | Freq: Every day | ORAL | 1 refills | Status: DC

## 2024-04-26 NOTE — Assessment & Plan Note (Signed)
 Will start low dose fluoxetine  which is weight neurtral

## 2024-04-26 NOTE — Progress Notes (Signed)
 Established Patient Office Visit  Subjective  Patient ID: Valerie Fisher, female    DOB: Oct 07, 1950  Age: 73 y.o. MRN: [id number]  Chief Complaint  Patient presents with   Medical Management of Chronic Issues    HPI  Discussed the use of AI scribe software for clinical note transcription with the patient, who gave verbal consent to proceed.  History of Present Illness Valerie Fisher is a 73 year old female who presents with concerns about circulation and arthritis management.  Lower extremity circulatory symptoms/had abnormal screening ABI with health nurse  - No calf pain with ambulation - No difficulty walking - No leg discoloration - Leg aches at night, attributed to daily activities such as stair climbing and prolonged standing - Legs feel rested in the morning - Family history of varicose veins (father)  Shoulder arthralgia and physical therapy - Arthritis in shoulders - Referred to physical therapy; attended one session - Physical therapy cost is $50 per session, with a plan for 8-10 sessions, which is cost prohibitive - Provided with home exercise program  Sleep disturbance and mood changes - Difficulty sleeping, often awake until 3-4 AM due to racing thoughts - Uses melatonin, which helps with sleep - Feels tired and sad related to changes in life circumstances and caregiving responsibilities for husband  Osteoporosis - due for Prolia  next week and need labs.      ROS    Objective:     BP 117/82   Pulse 93   Ht 5' (1.524 m)   Wt 124 lb (56.2 kg)   SpO2 100%   BMI 24.22 kg/m     Physical Exam Vitals and nursing note reviewed.  Constitutional:      Appearance: Normal appearance.  HENT:     Head: Normocephalic and atraumatic.  Eyes:     Conjunctiva/sclera: Conjunctivae normal.  Cardiovascular:     Rate and Rhythm: Normal rate and regular rhythm.  Pulmonary:     Effort: Pulmonary effort is normal.     Breath sounds: Normal breath sounds.   Skin:    General: Skin is warm and dry.  Neurological:     Mental Status: She is alert.  Psychiatric:        Mood and Affect: Mood normal.      No results found for any visits on 04/26/24.     The ASCVD Risk score (Arnett DK, et al., 2019) failed to calculate for the following reasons:   The valid HDL cholesterol range is 20 to 100 mg/dL    Assessment & Plan:   Problem List Items Addressed This Visit       Digestive   Fatty liver   Relevant Orders   CBC with Differential/Platelet   CMP14+EGFR   Lipid panel     Musculoskeletal and Integument   Osteoporosis   Due for Prolia , will get labs today.       Relevant Orders   CBC with Differential/Platelet   CMP14+EGFR   Lipid panel   Impingement syndrome of both shoulders     Other   Caregiver stress   Will start low dose fluoxetine  which is weight neurtral        Relevant Medications   FLUoxetine  (PROZAC ) 10 MG tablet   Abnormal ankle brachial index (ABI)   Gave reassurance. She is asymptomatic. We can always check formal ABIs if develops new sxs.       Other Visit Diagnoses       Encounter for screening mammogram for  malignant neoplasm of breast    -  Primary   Relevant Orders   MM 3D SCREENING MAMMOGRAM BILATERAL BREAST   CBC with Differential/Platelet   CMP14+EGFR   Lipid panel      Assessment and Plan Assessment & Plan Caregiver Stress  Experiencing depression with sadness, fatigue, and sleep disturbances. Discussed starting low-dose antidepressant. Reassured Prozac  is weight neutral and non-habit-forming. - Prescribe Prozac  10 mg daily. - Reassess in a few weeks for medication effectiveness.  Impingement of bilat shoulders Referred to physical therapy but found it costly and frequent. Discussed alternative options and provided home exercise sheet. - Encourage continuation of home exercises. - Consider returning to physical therapy if symptoms worsen or home exercises are  insufficient.   Return in about 8 weeks (around 06/21/2024) for New start medication.    Valerie Byars, MD

## 2024-04-26 NOTE — Assessment & Plan Note (Signed)
 Due for Prolia , will get labs today.

## 2024-04-26 NOTE — Assessment & Plan Note (Signed)
 Gave reassurance. She is asymptomatic. We can always check formal ABIs if develops new sxs.

## 2024-04-27 ENCOUNTER — Encounter: Payer: Self-pay | Admitting: Family Medicine

## 2024-04-27 ENCOUNTER — Ambulatory Visit: Payer: Self-pay | Admitting: Family Medicine

## 2024-04-27 DIAGNOSIS — E7800 Pure hypercholesterolemia, unspecified: Secondary | ICD-10-CM

## 2024-04-27 DIAGNOSIS — E785 Hyperlipidemia, unspecified: Secondary | ICD-10-CM | POA: Insufficient documentation

## 2024-04-27 LAB — LIPID PANEL
Chol/HDL Ratio: 2.6 ratio (ref 0.0–4.4)
Cholesterol, Total: 246 mg/dL — ABNORMAL HIGH (ref 100–199)
HDL: 94 mg/dL (ref >39)
LDL Chol Calc (NIH): 136 mg/dL — ABNORMAL HIGH (ref 0–99)
Triglycerides: 96 mg/dL (ref 0–149)
VLDL Cholesterol Cal: 16 mg/dL (ref 5–40)

## 2024-04-27 LAB — CMP14+EGFR
ALT: 10 IU/L (ref 0–32)
AST: 15 IU/L (ref 0–40)
Albumin: 4.6 g/dL (ref 3.8–4.8)
Alkaline Phosphatase: 55 IU/L (ref 44–121)
BUN/Creatinine Ratio: 30 — ABNORMAL HIGH (ref 12–28)
BUN: 24 mg/dL (ref 8–27)
Bilirubin Total: 0.6 mg/dL (ref 0.0–1.2)
CO2: 22 mmol/L (ref 20–29)
Calcium: 10.2 mg/dL (ref 8.7–10.3)
Chloride: 104 mmol/L (ref 96–106)
Creatinine, Ser: 0.8 mg/dL (ref 0.57–1.00)
Globulin, Total: 2.5 g/dL (ref 1.5–4.5)
Glucose: 86 mg/dL (ref 70–99)
Potassium: 4.6 mmol/L (ref 3.5–5.2)
Sodium: 140 mmol/L (ref 134–144)
Total Protein: 7.1 g/dL (ref 6.0–8.5)
eGFR: 77 mL/min/1.73 (ref >59)

## 2024-04-27 LAB — CBC WITH DIFFERENTIAL/PLATELET
Basophils Absolute: 0 x10E3/uL (ref 0.0–0.2)
Basos: 0 %
EOS (ABSOLUTE): 0.3 x10E3/uL (ref 0.0–0.4)
Eos: 5 %
Hematocrit: 46 % (ref 34.0–46.6)
Hemoglobin: 14.9 g/dL (ref 11.1–15.9)
Immature Grans (Abs): 0 x10E3/uL (ref 0.0–0.1)
Immature Granulocytes: 0 %
Lymphocytes Absolute: 1.6 x10E3/uL (ref 0.7–3.1)
Lymphs: 23 %
MCH: 29 pg (ref 26.6–33.0)
MCHC: 32.4 g/dL (ref 31.5–35.7)
MCV: 90 fL (ref 79–97)
Monocytes Absolute: 0.5 x10E3/uL (ref 0.1–0.9)
Monocytes: 7 %
Neutrophils Absolute: 4.3 x10E3/uL (ref 1.4–7.0)
Neutrophils: 65 %
Platelets: 294 x10E3/uL (ref 150–450)
RBC: 5.13 x10E6/uL (ref 3.77–5.28)
RDW: 14.3 % (ref 11.7–15.4)
WBC: 6.7 x10E3/uL (ref 3.4–10.8)

## 2024-04-27 NOTE — Progress Notes (Signed)
 Hi Valerie Fisher, metabolic panel overall looks good.  LDL cholesterol is elevated at 136 goal is less than 100.  Continue to work on healthy Mediterranean diet and exercise for 30 minutes 5 days/week.  Based on your current risk factors, your 10-year cardiovascular risk score is at 14% anybody with a risk 10% or higher it is recommended to consider a statin to lower cholesterol numbers and reduce risk for heart attack and stroke.  So I would encourage you to consider starting a statin.  Blood count looks good the platelets are back to normal they were little elevated 7 months ago.  The 10-year ASCVD risk score (Arnett DK, et al., 2019) is: 14%   Values used to calculate the score:     Age: 73 years     Clincally relevant sex: Female     Is Non-Hispanic African American: No     Diabetic: No     Tobacco smoker: No     Systolic Blood Pressure: 117 mmHg     Is BP treated: No     HDL Cholesterol: 94 mg/dL     Total Cholesterol: 246 mg/dL

## 2024-05-02 ENCOUNTER — Encounter: Payer: Self-pay | Admitting: *Deleted

## 2024-05-02 MED ORDER — DENOSUMAB 60 MG/ML ~~LOC~~ SOSY
60.0000 mg | PREFILLED_SYRINGE | SUBCUTANEOUS | Status: AC
  Administered 2024-05-28: 60 mg via SUBCUTANEOUS

## 2024-05-02 MED ORDER — ROSUVASTATIN CALCIUM 10 MG PO TABS: 10.0000 mg | ORAL_TABLET | Freq: Every day | ORAL | 3 refills | Status: AC

## 2024-05-02 NOTE — Addendum Note (Signed)
 Addended by: Johnanna Bakke L on: 05/02/2024 03:55 PM   Modules accepted: Orders

## 2024-05-02 NOTE — Progress Notes (Signed)
 Prescription for Crestor  10 mg at bedtime nightly sent to pharmacy.

## 2024-05-03 ENCOUNTER — Ambulatory Visit

## 2024-05-03 ENCOUNTER — Telehealth: Payer: Self-pay

## 2024-05-03 ENCOUNTER — Other Ambulatory Visit (HOSPITAL_COMMUNITY): Payer: Self-pay

## 2024-05-03 NOTE — Telephone Encounter (Signed)
 Prolia VOB initiated via [email or web address]  Next Prolia inj DUE: NOW

## 2024-05-04 ENCOUNTER — Other Ambulatory Visit (HOSPITAL_COMMUNITY): Payer: Self-pay

## 2024-05-04 NOTE — Telephone Encounter (Signed)
 Pt ready for scheduling for PROLIA  on or after : 05/04/24  Option# 1: Buy/Bill (Office supplied medication)  Out-of-pocket cost due at time of clinic visit: $332  Number of injection/visits approved: 2  Primary: [employer] Prolia  co-insurance: 20% Admin fee co-insurance: 0%  Secondary: --- Prolia  co-insurance:  Admin fee co-insurance:   Medical Benefit Details: Date Benefits were checked: 05/03/24 Deductible: NO/ Coinsurance: 20%/ Admin Fee: 0%  Prior Auth: APPROVED PA# [id number] Expiration Date: 05/04/24-05/04/25  # of doses approved: 2   ** This summary of benefits is an estimation of the patient's out-of-pocket cost. Exact cost may very based on individual plan coverage.

## 2024-05-04 NOTE — Telephone Encounter (Signed)
 SABRA

## 2024-05-09 ENCOUNTER — Telehealth: Payer: Self-pay

## 2024-05-09 ENCOUNTER — Ambulatory Visit

## 2024-05-09 ENCOUNTER — Other Ambulatory Visit: Payer: Self-pay

## 2024-05-09 ENCOUNTER — Other Ambulatory Visit: Payer: Self-pay | Admitting: Family Medicine

## 2024-05-09 DIAGNOSIS — M810 Age-related osteoporosis without current pathological fracture: Secondary | ICD-10-CM

## 2024-05-09 NOTE — Telephone Encounter (Signed)
 Prolia  VOB resubmitted in [employer] with additional insurance information.

## 2024-05-09 NOTE — Telephone Encounter (Signed)
 Spoke with patient - states she does have a secondary insurance and she will attach a copy of both insurance cards to a Mychart message to be added to her chart and then we can have the prior authorization team to do an updated PA For the prolia  regarding coverage.

## 2024-05-21 NOTE — Telephone Encounter (Signed)
 Called [employer] at [phone], per representative, benefit verification should be completed today. Will continue to check back.

## 2024-05-21 NOTE — Telephone Encounter (Signed)
 Hello Ashleigh ,  Any updates on this for the patient? Thanks for checking.

## 2024-05-21 NOTE — Telephone Encounter (Signed)
 Just checked [employer]. Benefit verification is still in progress. Will call [employer] to see if we can get an update.

## 2024-05-22 NOTE — Telephone Encounter (Signed)
 SABRA

## 2024-05-22 NOTE — Telephone Encounter (Signed)
 [employer] PA SUBMITTED VIA [employer]. Authorization Number : [id number]

## 2024-05-23 ENCOUNTER — Other Ambulatory Visit (HOSPITAL_COMMUNITY): Payer: Self-pay

## 2024-05-23 NOTE — Telephone Encounter (Signed)
 Patient scheduled for Prolia  injection =kph

## 2024-05-23 NOTE — Telephone Encounter (Signed)
 Pt ready for scheduling for PROLIA  on or after : 05/23/24  Option# 1: Buy/Bill (Office supplied medication)  Out-of-pocket cost due at time of clinic visit: $0  Number of injection/visits approved: 2  Primary: [employer] Prolia  co-insurance: 0% Admin fee co-insurance: 0%  Secondary: [employer] Prolia  co-insurance:  Admin fee co-insurance:   Medical Benefit Details: Date Benefits were checked: 05/21/24 Deductible: NO/ Coinsurance: 0%/ Admin Fee: 0%  Prior Auth: APPROVED [facility]) PA# [id number] Expiration Date: 05/04/24-05/04/25  # of doses approved: 2  Prior Auth: APPROVED [facility]) PA# [id number] Expiration Date: 05/22/24-05/21/25   # of doses approved: 2   ** This summary of benefits is an estimation of the patient's out-of-pocket cost. Exact cost may very based on individual plan coverage.

## 2024-05-28 ENCOUNTER — Ambulatory Visit

## 2024-05-28 VITALS — BP 119/73 | HR 78 | Resp 17 | Ht 60.0 in

## 2024-05-28 DIAGNOSIS — M810 Age-related osteoporosis without current pathological fracture: Secondary | ICD-10-CM | POA: Diagnosis not present

## 2024-05-28 MED ORDER — PROLIA 60 MG/ML ~~LOC~~ SOSY: 60.0000 mg | PREFILLED_SYRINGE | SUBCUTANEOUS | 0 refills | Status: DC

## 2024-05-28 MED ORDER — DENOSUMAB 60 MG/ML ~~LOC~~ SOSY: 60.0000 mg | PREFILLED_SYRINGE | Freq: Once | SUBCUTANEOUS | 0 refills | Status: DC

## 2024-05-28 NOTE — Progress Notes (Signed)
 Patient is in office today for her 6 month prolia  injection. Denies CP, SOB, palpations, or medication changes.  Patient given injection in her Right UA. Tolerated well no redness or swelling noted at the site.  Pt advised to RTC in 6 months for next injection (around 11/27/23)

## 2024-05-30 ENCOUNTER — Telehealth: Payer: Self-pay

## 2024-05-30 NOTE — Telephone Encounter (Signed)
 The last referral I see was on 05/02/24.

## 2024-05-30 NOTE — Telephone Encounter (Signed)
 Would you please check to see if Polia was ordered again on 05/28/2024 to initiate the prior auth process ?

## 2024-05-31 MED ORDER — PROLIA 60 MG/ML ~~LOC~~ SOSY: 60.0000 mg | PREFILLED_SYRINGE | SUBCUTANEOUS | 0 refills | Status: AC

## 2024-05-31 NOTE — Addendum Note (Signed)
 Addended by: Crisanto Nied P on: 05/31/2024 03:05 PM   Modules accepted: Orders

## 2024-06-06 NOTE — Telephone Encounter (Signed)
 Chart shows this medication was ordered on date of visit.

## 2024-06-07 ENCOUNTER — Ambulatory Visit: Admitting: Medical-Surgical

## 2024-06-21 ENCOUNTER — Ambulatory Visit: Admitting: Family Medicine

## 2024-07-30 ENCOUNTER — Ambulatory Visit: Admitting: Family Medicine

## 2024-07-30 VITALS — BP 116/69 | HR 80 | Ht 60.0 in | Wt 122.1 lb

## 2024-07-30 DIAGNOSIS — K760 Fatty (change of) liver, not elsewhere classified: Secondary | ICD-10-CM | POA: Diagnosis not present

## 2024-07-30 DIAGNOSIS — E7800 Pure hypercholesterolemia, unspecified: Secondary | ICD-10-CM

## 2024-07-30 DIAGNOSIS — Z636 Dependent relative needing care at home: Secondary | ICD-10-CM | POA: Diagnosis not present

## 2024-07-30 MED ORDER — FLUOXETINE HCL 20 MG PO TABS: 20.0000 mg | ORAL_TABLET | Freq: Every day | ORAL | 0 refills | Status: AC

## 2024-07-30 NOTE — Progress Notes (Signed)
 Established Patient Office Visit  Patient ID: Valerie Fisher, female    DOB: 16-Oct-1950  Age: 73 y.o. MRN: [id number] PCP: Alvan Dorothyann BIRCH, MD  Chief Complaint  Patient presents with   Medical Management of Chronic Issues    Subjective:     HPI  Discussed the use of AI scribe software for clinical note transcription with the patient, who gave verbal consent to proceed.  History of Present Illness Valerie Fisher is a 73 year old female who presents for follow-up on fluoxetine  treatment and concerns about her husband's health.  Generalized anxiety symptoms - Taking fluoxetine  10 mg daily for approximately eight weeks. - Improved sleep since starting fluoxetine . - Persistent excessive worrying, primarily regarding husband's health and future living arrangements. - Concerns about managing household responsibilities and potential future transitions, including moving to [place]. - Worries about downsizing, selling the home, and logistics of relocation. - Hesitant to rely on son taking over the house despite his interest.  Caregiver stress - Husband experiencing worsening breathing difficulties and significant fatigue after minimal exertion, such as climbing stairs or standing to do dishes. - Husband has increased oxygen use in the past few days, which has provided some relief. - Patient is primarily responsible for household tasks due to husband's limitations. - Reflects on husband's past health, noting previous higher weight and better health status. - Husband has lost weight despite eating four to five times a day, attributed to Enbrel use. - Paxlovid has improved husband's appetite.  Hyperlipidemia and hepatic concerns - Concerned about cholesterol levels and liver function. - History of fatty liver and gallbladder issues. - Interested in monitoring hepatic and metabolic health.     ROS    Objective:     BP 116/69   Pulse 80   Ht 5' (1.524 m)   Wt 122 lb  1.3 oz (55.4 kg)   SpO2 99%   BMI 23.84 kg/m    Physical Exam Vitals reviewed.  Constitutional:      Appearance: Normal appearance.  HENT:     Head: Normocephalic.  Pulmonary:     Effort: Pulmonary effort is normal.  Neurological:     Mental Status: She is alert and oriented to person, place, and time.  Psychiatric:        Mood and Affect: Mood normal.        Behavior: Behavior normal.      No results found for any visits on 07/30/24.    The 10-year ASCVD risk score (Arnett DK, et al., 2019) is: 13.8%    Assessment & Plan:   Problem List Items Addressed This Visit       Digestive   Fatty liver   Relevant Orders   CMP14+EGFR   Lipid panel     Other   Hyperlipidemia - Primary   Relevant Orders   CMP14+EGFR   Lipid panel   Caregiver stress   Relevant Medications   FLUoxetine  (PROZAC ) 20 MG tablet    Assessment and Plan Assessment & Plan Generalized anxiety disorder Improvement on fluoxetine  10 mg, but current dose may not be optimal for anxiety management. - Increased fluoxetine  to 20 mg daily. - Instructed to monitor for side effects and effectiveness. - Discussed potential dose adjustment based on response.  Hyperlipidemia Management includes monitoring liver function and cholesterol levels. - Ordered blood work for liver function and cholesterol levels.  Fatty liver Monitored through liver enzyme levels. Statins may slightly improve condition but are not a cure. - Ordered blood work  for liver enzymes. - Emphasized importance of diet, exercise, and maintaining a healthy weight.  Gallbladder disease Previous imaging suggested possible gallbladder polyp or shadow. - Ordered blood work for bilirubin levels.    Return in about 2 months (around 09/30/2024) for Mood.    [name], MD [facility] at [facility]

## 2024-07-31 ENCOUNTER — Ambulatory Visit: Payer: Self-pay | Admitting: Family Medicine

## 2024-07-31 LAB — CMP14+EGFR
ALT: 15 IU/L (ref 0–32)
AST: 17 IU/L (ref 0–40)
Albumin: 4.3 g/dL (ref 3.8–4.8)
Alkaline Phosphatase: 52 IU/L (ref 49–135)
BUN/Creatinine Ratio: 33 — ABNORMAL HIGH (ref 12–28)
BUN: 23 mg/dL (ref 8–27)
Bilirubin Total: 0.8 mg/dL (ref 0.0–1.2)
CO2: 18 mmol/L — ABNORMAL LOW (ref 20–29)
Calcium: 9.5 mg/dL (ref 8.7–10.3)
Chloride: 103 mmol/L (ref 96–106)
Creatinine, Ser: 0.7 mg/dL (ref 0.57–1.00)
Globulin, Total: 2.1 g/dL (ref 1.5–4.5)
Glucose: 82 mg/dL (ref 70–99)
Potassium: 4.5 mmol/L (ref 3.5–5.2)
Sodium: 140 mmol/L (ref 134–144)
Total Protein: 6.4 g/dL (ref 6.0–8.5)
eGFR: 90 mL/min/1.73 (ref >59)

## 2024-07-31 LAB — LIPID PANEL
Chol/HDL Ratio: 1.7 ratio (ref 0.0–4.4)
Cholesterol, Total: 164 mg/dL (ref 100–199)
HDL: 94 mg/dL (ref >39)
LDL Chol Calc (NIH): 57 mg/dL (ref 0–99)
Triglycerides: 67 mg/dL (ref 0–149)
VLDL Cholesterol Cal: 13 mg/dL (ref 5–40)

## 2024-07-31 NOTE — Progress Notes (Signed)
 Hi [place], metabolic panel is normal.  Cholesterol overall looks great in fact the LDL is down and looks fantastic compared to 3 months ago.  Great results with the medication.

## 2024-08-10 ENCOUNTER — Other Ambulatory Visit: Payer: Self-pay | Admitting: Family Medicine

## 2024-08-10 DIAGNOSIS — E559 Vitamin D deficiency, unspecified: Secondary | ICD-10-CM

## 2024-10-01 ENCOUNTER — Ambulatory Visit: Admitting: Family Medicine

## 2024-11-01 ENCOUNTER — Ambulatory Visit

## 2024-11-27 ENCOUNTER — Ambulatory Visit
